# Patient Record
Sex: Female | Born: 1940
Health system: Southern US, Community
[De-identification: ages and names within clinical notes are randomized; demographics above are authoritative.]

## PROBLEM LIST (undated history)

## (undated) DIAGNOSIS — I10 Essential (primary) hypertension: Secondary | ICD-10-CM

## (undated) DIAGNOSIS — M797 Fibromyalgia: Secondary | ICD-10-CM

## (undated) DIAGNOSIS — E039 Hypothyroidism, unspecified: Secondary | ICD-10-CM

## (undated) DIAGNOSIS — E119 Type 2 diabetes mellitus without complications: Secondary | ICD-10-CM

## (undated) DIAGNOSIS — Z8719 Personal history of other diseases of the digestive system: Secondary | ICD-10-CM

## (undated) DIAGNOSIS — F0390 Unspecified dementia without behavioral disturbance: Secondary | ICD-10-CM

## (undated) DIAGNOSIS — R519 Headache, unspecified: Secondary | ICD-10-CM

## (undated) DIAGNOSIS — M1990 Unspecified osteoarthritis, unspecified site: Secondary | ICD-10-CM

## (undated) DIAGNOSIS — R51 Headache: Secondary | ICD-10-CM

## (undated) DIAGNOSIS — T884XXA Failed or difficult intubation, initial encounter: Secondary | ICD-10-CM

## (undated) DIAGNOSIS — T4145XA Adverse effect of unspecified anesthetic, initial encounter: Secondary | ICD-10-CM

## (undated) DIAGNOSIS — K219 Gastro-esophageal reflux disease without esophagitis: Secondary | ICD-10-CM

## (undated) DIAGNOSIS — T8859XA Other complications of anesthesia, initial encounter: Secondary | ICD-10-CM

## (undated) HISTORY — PX: TONSILLECTOMY: SUR1361

## (undated) HISTORY — PX: KNEE SURGERY: SHX244

## (undated) HISTORY — PX: CHOLECYSTECTOMY: SHX55

## (undated) HISTORY — PX: BREAST SURGERY: SHX581

## (undated) HISTORY — PX: DILATION AND CURETTAGE OF UTERUS: SHX78

---

## 1997-12-19 ENCOUNTER — Ambulatory Visit (HOSPITAL_COMMUNITY): Admission: RE | Admit: 1997-12-19 | Discharge: 1997-12-19 | Payer: Self-pay | Admitting: Gastroenterology

## 1998-03-25 ENCOUNTER — Other Ambulatory Visit: Admission: RE | Admit: 1998-03-25 | Discharge: 1998-03-25 | Payer: Self-pay | Admitting: Gynecology

## 1999-09-18 ENCOUNTER — Other Ambulatory Visit: Admission: RE | Admit: 1999-09-18 | Discharge: 1999-09-18 | Payer: Self-pay | Admitting: Gynecology

## 2000-11-16 ENCOUNTER — Other Ambulatory Visit: Admission: RE | Admit: 2000-11-16 | Discharge: 2000-11-16 | Payer: Self-pay | Admitting: Gynecology

## 2001-03-15 ENCOUNTER — Encounter: Admission: RE | Admit: 2001-03-15 | Discharge: 2001-06-13 | Payer: Self-pay | Admitting: Family Medicine

## 2004-10-30 ENCOUNTER — Other Ambulatory Visit: Admission: RE | Admit: 2004-10-30 | Discharge: 2004-10-30 | Payer: Self-pay | Admitting: Gynecology

## 2006-02-10 ENCOUNTER — Other Ambulatory Visit: Admission: RE | Admit: 2006-02-10 | Discharge: 2006-02-10 | Payer: Self-pay | Admitting: Gynecology

## 2006-04-24 ENCOUNTER — Emergency Department (HOSPITAL_COMMUNITY): Admission: EM | Admit: 2006-04-24 | Discharge: 2006-04-24 | Payer: Self-pay | Admitting: Emergency Medicine

## 2007-02-14 ENCOUNTER — Other Ambulatory Visit: Admission: RE | Admit: 2007-02-14 | Discharge: 2007-02-14 | Payer: Self-pay | Admitting: Gynecology

## 2007-03-16 ENCOUNTER — Encounter: Admission: RE | Admit: 2007-03-16 | Discharge: 2007-03-16 | Payer: Self-pay | Admitting: Surgery

## 2007-03-30 ENCOUNTER — Encounter: Admission: RE | Admit: 2007-03-30 | Discharge: 2007-03-30 | Payer: Self-pay | Admitting: Gastroenterology

## 2007-09-21 ENCOUNTER — Ambulatory Visit (HOSPITAL_COMMUNITY): Admission: RE | Admit: 2007-09-21 | Discharge: 2007-09-21 | Payer: Self-pay | Admitting: Surgery

## 2007-09-25 ENCOUNTER — Emergency Department (HOSPITAL_COMMUNITY): Admission: EM | Admit: 2007-09-25 | Discharge: 2007-09-25 | Payer: Self-pay | Admitting: Emergency Medicine

## 2008-03-02 ENCOUNTER — Encounter: Admission: RE | Admit: 2008-03-02 | Discharge: 2008-03-02 | Payer: Self-pay | Admitting: Family Medicine

## 2008-03-19 ENCOUNTER — Ambulatory Visit: Admission: RE | Admit: 2008-03-19 | Discharge: 2008-06-17 | Payer: Self-pay | Admitting: Family Medicine

## 2010-06-03 NOTE — Consult Note (Signed)
NAME:  Jennifer Boyer, Jennifer Boyer NO.:  [id number]   MEDICAL RECORD NO.:  [id number]          PATIENT TYPE:  EMS   LOCATION:  ED                           FACILITY:  [facility]   PHYSICIAN:  Almond Lint, MD       DATE OF BIRTH:  March 05, 1940   DATE OF CONSULTATION:  09/25/2007  DATE OF DISCHARGE:  09/25/2007                                 CONSULTATION   CHIEF COMPLAINT:  Right upper quadrant abdominal pain.   HISTORY OF PRESENT ILLNESS:  Jennifer Boyer is a 70 year old female who has  a history of right upper quadrant pain for the last year.  She reports  that she did have pain last year that was attributed to her gallbladder  and she underwent a laparoscopic cholecystectomy by Dr. Corliss Skains.  Postoperatively, she developed a different kind of pain 2 days later in  the right upper quadrant that she describes feeling like a branding  iron.  This has been going on for about a year.  She has tried taking  ibuprofen and it does not help.  She had a colonoscopy and upper  endoscopy, which were nondiagnostic.  She also underwent CT scanning,  which was not revealing either.  Since that time, has had continued  pain, she underwent an exploratory diagnostic laparoscopy to evaluate  what could possibly be the cause of her pain.  The adhesions in the  omentum to the liver were taken down and she woke up from surgery with  the same pain.  She was doing okay over the last couple days, but then  it became unbearable.  She called me on the phone and I asked her to  come in to be evaluated.  She states that, in the past she has tried  ibuprofen for this pain to no avail, as well as Maalox.  She has also  tried stool softeners.  Currently, she has not had a bowel movement  since surgery 5 days ago.  Denies fevers or chills.  She denies nausea  or vomiting.  Narcotics cause minimal relief.   PAST MEDICAL HISTORY:  Significant for a history of either DCIS or LCIS,  which she describes as precancerous  lesions in her breast requiring  bilateral mastectomies.   SURGICAL HISTORY:  Lap chole, bilateral mastectomies, diagnostic  laparoscopy.   ALLERGIES:  None.   REVIEW OF SYSTEMS:  Otherwise negative times 11 systems, except for the  HPI.   FAMILY HISTORY:  She has a brother who had a mastectomy for breast  cancer.   EXAMINATION:  VITAL SIGNS:  Temperature 98.2, pulse 72, blood pressure  137/78, respiratory rate 20, 98% on room air.  Pain score 10/10.  CONSTITUTIONAL:  She is alert and oriented x3 and is in tears.  HEENT:  Normocephalic, atraumatic.  Pupils equal, round, and reactive to  light.  ABDOMEN:  Soft, nondistended.  Her current laparoscopy incisions are  nontender.  The site of the most lateral lap chole incision, which was  not reopened, is exquisitely tender, and the tenderness extends from up  to the margin of the  floating ribs 11 and 12.  There is no erythema  here.  There is no mass palpated here.  EXTREMITIES:  Warm and well perfused with no pitting edema.  Pulses are  palpable.   LABS:  White count is normal with a white count of 6600 with neutrophil  percentage of 55.  H&H is 13.1 and 38.6, platelet count is 347,000.  Chemistries are within normal limits, including LFTs, which are within  normal limits.  A 3-way of the abdomen demonstrates stool in the colon,  but no evidence of acute abnormality.  There is some left basilar  scarring noted in the right lung.   ASSESSMENT:  Jennifer Boyer is a 70 year old female with chronic right upper  quadrant abdominal pain following lap chole.  Based on the location of  area of old incision, I theorize that perhaps she had some entrapped  nerve ending or musculoskeletal scar tissue between her fascia layers.  I attempted injecting this with local anesthesia, and it seemed to help  temporarily, but she kept having pain come back in the same areas that I  had anesthetized.  It is unclear if the pain was deeper or in the   abdominal wall, but she seemed to state that where I was injecting was  the spot that was hurting.  I elected not to perform a CT scan on her as  her white count is normal and her LFTs are normal.  She had a scan for  this pain before, which did not yield any relevant information.  Given  the fact that she has no current change in the pain and no indicators of  infection, I do not feel a CT scan would be helpful at the immediate  time.  We gave her a shot of Toradol IM and Dilaudid IM in the ER.  She  has been taking Vicodin and has noted minimal to no relief with this.  I  advised her to try Percocet, for which I wrote her a prescription, as  well as standing ibuprofen.  With the thought that this may be trapped  nerve ending I also wrote her for Neurontin 100 mg in the morning and  lunch, and 200 mg at night for 3 weeks to see if she has any relief.  I  also advised her that she does appear to be quite constipated and that I  would have her take Miralax once a day and Colace twice a day until she  becomes more regular.  She expressed understanding.  I will discuss the  patient's visit with Dr. Corliss Skains.  Overall, I think the primary goal for  her is to see a pain specialist to perhaps try a TENS unit or perhaps  some other alternative method of pain control.      Almond Lint, MD  Electronically Signed     FB/MEDQ  D:  09/25/2007  T:  09/26/2007  Job:  [id number]

## 2010-06-03 NOTE — Op Note (Signed)
NAME:  Jennifer Boyer, Jennifer Boyer NO.:  [id number]   MEDICAL RECORD NO.:  [id number]          PATIENT TYPE:  AMB   LOCATION:  SDS                          FACILITY:  [facility]   PHYSICIAN:  Wilmon Arms. Corliss Skains, M.D. DATE OF BIRTH:  11-Oct-1940   DATE OF PROCEDURE:  09/21/2007  DATE OF DISCHARGE:  09/21/2007                               OPERATIVE REPORT   PREOPERATIVE DIAGNOSIS:  Persistent right upper quadrant pain.   POSTOPERATIVE DIAGNOSIS:  Persistent right upper quadrant pain.   PROCEDURE PERFORMED:  Diagnostic laparoscopy with laparoscopic lysis of  adhesions.   SURGEON:  Wilmon Arms. Corliss Skains, MD, FACS   ANESTHESIA:  General endotracheal.   INDICATIONS:  The patient is a 70 year old female who is about a year  postop from a laparoscopic cholecystectomy for chronic calculus and  cholecystitis.  Initially, the patient did quite well but she has  developed some chronic right upper quadrant abdominal pain.  She has  undergone extensive workup.  A CT scan was essentially negative.  EGD  showed a small hiatal hernia and a Schatzki's ring.  A colonoscopy  showed some left-sided diverticulosis.  I have conferred with the  patient and her gastroenterologist, Dr. Matthias Hughs.  We  discussed the  possibility of a diagnostic laparoscopy to look at the right upper  quadrant.  The patient presents now for diagnostic laparoscopy.   DESCRIPTION OF PROCEDURE:  The patient was brought to the operating room  and placed in a supine position on the operating room table.  After an  adequate level of general anesthesia was obtained, her abdomen was  prepped with Betadine and draped in sterile fashion.  We anesthetized  the infraumbilical incision with 0.25% Marcaine with epinephrine.  I  made a transverse incision through this old scar.  Dissection was  carried down the fascia.  The fascia was grasped with Petro clamps and  opened vertically.  We entered the peritoneal cavity.  There were no  adhesions at the umbilicus.  There was a small laxity in the abdominal  wall directly behind the umbilicus.  However, there was no sign of  herniation.  We placed a pursestring suture of 0 Vicryl around the  fascial opening.  The Hasson cannula was inserted and secured to stay  suture.  Pneumoperitoneum was obtained by insufflating CO2 maintaining  maximum pressure of 15 mmHg.  The patient was positioned reverse  Trendelenburg and tilted to her left.  We examined the right upper  quadrant.  There were no adhesions to the anterior surface of the liver.  There were no muscular defects or any gross abnormalities of the  anterior abdominal wall.  The liver appeared normal in color.  I placed  a 5 mm port in the subxiphoid position.  Another 5-mm port was placed in  the right upper quadrant.  We used her old scars for all of these.  I  used a blunt dissector to elevate the edge of the liver.  The omentum  had become densely adherent into the gallbladder fossa.  We meticulously  dissected this away from the liver.  We  continued dissecting all the way  down the gallbladder fossa until we could visualize the clips.  We  cauterized thoroughly for any bleeding.  We thoroughly irrigated the  right upper quadrant.  I attempted to place a small sheet of Seprafilm.  However, when I tried to place the Seprafilm in the gallbladder fossa,  the film essentially disintegrated and balled up into a small area.  This was not adequate.  We then tried a piece of SurgiWrap.  We were  able to place this in the gallbladder fossa to prevent any further  adhesions in this area.  Hemostasis was good.  We took some adhesions  down the anterior wall of the cecum to the right lateral abdominal wall.  No bleeding was noted.  We evacuated the  pneumoperitoneum as we removed the ports.  The umbilical fascia was  closed with the pursestring suture.  A 4-0 Monocryl was used to close  the skin incisions.  Steri-Strips and clean  dressings were applied.  The  patient was then extubated and brought to recovery in stable condition.  All sponge, instrument, and needle counts were correct.      Wilmon Arms. Tsuei, M.D.  Electronically Signed     MKT/MEDQ  D:  09/21/2007  T:  09/22/2007  Job:  [id number]

## 2010-10-22 LAB — DIFFERENTIAL
Basophils Absolute: 0
Basophils Relative: 0
Eosinophils Absolute: 0.1
Eosinophils Relative: 1
Lymphocytes Relative: 38
Lymphs Abs: 2.5
Monocytes Absolute: 0.4
Monocytes Relative: 6
Neutro Abs: 3.5
Neutrophils Relative %: 55

## 2010-10-22 LAB — COMPREHENSIVE METABOLIC PANEL
Albumin: 3.4 — ABNORMAL LOW
BUN: 8
Calcium: 9.1
Glucose, Bld: 101 — ABNORMAL HIGH
Potassium: 4.2
Total Protein: 7.2

## 2010-10-22 LAB — CBC
HCT: 38.6
Hemoglobin: 13.1
MCHC: 33.8
MCV: 96.9
Platelets: 247
RBC: 3.98
RDW: 12.6
WBC: 6.5

## 2012-09-20 ENCOUNTER — Encounter (HOSPITAL_COMMUNITY): Payer: Self-pay | Admitting: Pharmacist

## 2012-09-23 MED ORDER — BUPIVACAINE HCL (PF) 0.25 % IJ SOLN
INTRAMUSCULAR | Status: AC
  Filled 2012-09-23: qty 30

## 2012-09-27 ENCOUNTER — Encounter (HOSPITAL_COMMUNITY)
Admission: RE | Admit: 2012-09-27 | Discharge: 2012-09-27 | Disposition: A | Discharge status: Home / Self Care | Payer: Medicare Other | Source: Ambulatory Visit | Attending: Obstetrics and Gynecology | Admitting: Obstetrics and Gynecology

## 2012-09-27 ENCOUNTER — Encounter (HOSPITAL_COMMUNITY): Payer: Self-pay

## 2012-09-27 ENCOUNTER — Other Ambulatory Visit: Payer: Self-pay

## 2012-09-27 HISTORY — DX: Other complications of anesthesia, initial encounter: T88.59XA

## 2012-09-27 HISTORY — DX: Type 2 diabetes mellitus without complications: E11.9

## 2012-09-27 HISTORY — DX: Hypothyroidism, unspecified: E03.9

## 2012-09-27 HISTORY — DX: Adverse effect of unspecified anesthetic, initial encounter: T41.45XA

## 2012-09-27 LAB — CBC
HCT: 39.7 % (ref 36.0–46.0)
Hemoglobin: 13.2 g/dL (ref 12.0–15.0)
MCH: 31.5 pg (ref 26.0–34.0)
MCHC: 33.2 g/dL (ref 30.0–36.0)
MCV: 94.7 fL (ref 78.0–100.0)
RBC: 4.19 MIL/uL (ref 3.87–5.11)

## 2012-09-27 LAB — COMPREHENSIVE METABOLIC PANEL
ALT: 18 U/L (ref 0–35)
BUN: 19 mg/dL (ref 6–23)
CO2: 26 mEq/L (ref 19–32)
Calcium: 9.9 mg/dL (ref 8.4–10.5)
Creatinine, Ser: 0.81 mg/dL (ref 0.50–1.10)
GFR calc Af Amer: 83 mL/min — ABNORMAL LOW (ref >90)
GFR calc non Af Amer: 71 mL/min — ABNORMAL LOW (ref >90)
Glucose, Bld: 112 mg/dL — ABNORMAL HIGH (ref 70–99)
Sodium: 136 mEq/L (ref 135–145)

## 2012-09-27 NOTE — Patient Instructions (Addendum)
20 SCOTTY PINDER  09/27/2012   Your procedure is scheduled on:  09/30/12  Enter through the Main Entrance of [facility] at 6 AM.  Pick up the phone at the desk and dial [phone].   Call this number if you have problems the morning of surgery: [phone]   Remember:   Do not eat food:After Midnight.  Do not drink clear liquids: After Midnight.  Take these medicines the morning of surgery with A SIP OF WATER: Hold Metformin for 24hrs prior to surgery   Do not wear jewelry, make-up or nail polish.  Do not wear lotions, powders, or perfumes. You may wear deodorant.  Do not shave 48 hours prior to surgery.  Do not bring valuables to the hospital.  [facility] is not   responsible for any belongings or valuables brought to the hospital.  Contacts, dentures or bridgework may not be worn into surgery.  Leave suitcase in the car. After surgery it may be brought to your room.  For patients admitted to the hospital, checkout time is 11:00 AM the day of              discharge.   Patients discharged the day of surgery will not be allowed to drive             home.  Name and phone number of your driver: husband Amada Jupiter  Special Instructions:   Shower using CHG 2 nights before surgery and the night before surgery.  If you shower the day of surgery use CHG.  Use special wash - you have one bottle of CHG for all showers.  You should use approximately 1/3 of the bottle for each shower.   Please read over the following fact sheets that you were given:   Surgical Site Infection Prevention   20 DESTINEE TABER  09/27/2012   Your procedure is scheduled on:  09/30/12  Enter through the Main Entrance of [facility] at 6 AM.  Pick up the phone at the desk and dial [phone].   Call this number if you have problems the morning of surgery: [phone]   Remember:   Do not eat food:After Midnight.  Do not drink clear liquids: After Midnight.  Take these medicines the morning of surgery with A SIP OF  WATER: hold Metformin for 24hrs prior to surgery   Do not wear jewelry, make-up or nail polish.  Do not wear lotions, powders, or perfumes. You may wear deodorant.  Do not shave 48 hours prior to surgery.  Do not bring valuables to the hospital.  [facility] is not   responsible for any belongings or valuables brought to the hospital.  Contacts, dentures or bridgework may not be worn into surgery.  Leave suitcase in the car. After surgery it may be brought to your room.  For patients admitted to the hospital, checkout time is 11:00 AM the day of              discharge.   Patients discharged the day of surgery will not be allowed to drive             home.  Name and phone number of your driver: husband   Amada Jupiter  Special Instructions:   Shower using CHG 2 nights before surgery and the night before surgery.  If you shower the day of surgery use CHG.  Use special wash - you have one bottle of CHG for all showers.  You should use approximately 1/3  of the bottle for each shower.   Please read over the following fact sheets that you were given:   Surgical Site Infection Prevention

## 2012-09-28 NOTE — Pre-Procedure Instructions (Signed)
Anes record from surgery of 09/21/07 requested from [facility] per request of Cristela Blue, MD.

## 2012-09-28 NOTE — Anesthesia Preprocedure Evaluation (Addendum)
Anesthesia Evaluation  Patient identified by MRN, date of birth, ID band Patient awake    Reviewed: Allergy & Precautions, H&P , NPO status , Patient's Chart, lab work & pertinent test results  History of Anesthesia Complications (+) DIFFICULT AIRWAY  Airway Mallampati: III TM Distance: >3 FB Neck ROM: full    Dental no notable dental hx. (+) Teeth Intact   Pulmonary neg pulmonary ROS,    Pulmonary exam normal       Cardiovascular negative cardio ROS      Neuro/Psych negative neurological ROS  negative psych ROS   GI/Hepatic negative GI ROS, Neg liver ROS,   Endo/Other  diabetes, Oral Hypoglycemic AgentsHypothyroidism Morbid obesity  Renal/GU negative Renal ROS     Musculoskeletal negative musculoskeletal ROS (+)   Abdominal Normal abdominal exam  (+)   Peds  Hematology negative hematology ROS (+)   Anesthesia Other Findings   Reproductive/Obstetrics negative OB ROS                          Anesthesia Physical Anesthesia Plan  ASA: III  Anesthesia Plan: General LMA and General   Post-op Pain Management:    Induction: Intravenous  Airway Management Planned: LMA  Additional Equipment:   Intra-op Plan:   Post-operative Plan:   Informed Consent: I have reviewed the patients History and Physical, chart, labs and discussed the procedure including the risks, benefits and alternatives for the proposed anesthesia with the patient or authorized representative who has indicated his/her understanding and acceptance.     Plan Discussed with: CRNA, Anesthesiologist and Surgeon  Anesthesia Plan Comments: (Patient with history of difficult intubation at surgical center.  Had a subsequent surgery at [facility], was intubated with glide scope without difficulty.  Paper copy of anesthesia record from that procedure is on the chart.  Jasmine December, MD)       Anesthesia Quick Evaluation

## 2012-09-29 NOTE — H&P (Signed)
Jennifer Boyer is an 72 y.o. female was begin evaluated for pelvic relaxation. U/S noted thick endometrial stripe. Subsequent SHG C/W 15 x 8 mm polypoid process in endometrial cavity. Had EM polyp in past. No vaginal bleeding. Pertinent Gynecological History: Menses: post-menopausal Bleeding: none Contraception: none DES exposure: denies Blood transfusions: none Sexually transmitted diseases: no past history Previous GYN Procedures: DNC  Last mammogram: normal Date: 2014 Last pap: normal Date: 2014 OB History: G2, P2   Menstrual History: Menarche age: unknown  No LMP recorded.    Past Medical History  Diagnosis Date  . Complication of anesthesia     pt has been told she is hard to intubate.  . Diabetes mellitus without complication   . Hypothyroidism     Past Surgical History  Procedure Laterality Date  . Breast surgery      bilateral mastectomy  . Cholecystectomy    . Knee surgery    . Tonsillectomy    . Dilation and curettage of uterus      uterine polyp    No family history on file.  Social History:  reports that she has never smoked. She does not have any smokeless tobacco history on file. She reports that she does not drink alcohol or use illicit drugs.  Allergies: No Known Allergies  No prescriptions prior to admission    Review of Systems  Constitutional: Negative for fever.    There were no vitals taken for this visit. Physical Exam  Cardiovascular: Normal rate and regular rhythm.   Respiratory: Effort normal and breath sounds normal.  GI: Soft.  Genitourinary:  Normal size uterus, second degree prolapse, NT Adnexa NT without masses Cystocele/ rectocele     No results found for this or any previous visit (from the past 24 hour(s)).  No results found.  Assessment/Plan: 72 yo with U/S suggestive of recurrent EM polyp D/W patient H/S, Trueclear resectoscope and D&C-risks reviewed including infection, uterine perforation, organ damage,  bleeding/transfusion-HIV/Hep, DVT/PE, pneumonia, recurrent polyps or PMB. All questions answered.  Thara Searing II,[place]ott E 09/29/2012, 12:44 PM

## 2012-09-30 ENCOUNTER — Ambulatory Visit (HOSPITAL_COMMUNITY)
Admission: RE | Admit: 2012-09-30 | Discharge: 2012-09-30 | Disposition: A | Discharge status: Home / Self Care | Payer: Medicare Other | Source: Ambulatory Visit | Attending: Obstetrics and Gynecology | Admitting: Obstetrics and Gynecology

## 2012-09-30 ENCOUNTER — Ambulatory Visit (HOSPITAL_COMMUNITY): Payer: Medicare Other | Admitting: Anesthesiology

## 2012-09-30 ENCOUNTER — Encounter (HOSPITAL_COMMUNITY)
Admission: RE | Disposition: A | Discharge status: Home / Self Care | Payer: Self-pay | Source: Ambulatory Visit | Attending: Obstetrics and Gynecology

## 2012-09-30 ENCOUNTER — Encounter (HOSPITAL_COMMUNITY): Payer: Self-pay | Admitting: Anesthesiology

## 2012-09-30 DIAGNOSIS — R9389 Abnormal findings on diagnostic imaging of other specified body structures: Secondary | ICD-10-CM | POA: Insufficient documentation

## 2012-09-30 DIAGNOSIS — N840 Polyp of corpus uteri: Secondary | ICD-10-CM | POA: Insufficient documentation

## 2012-09-30 HISTORY — PX: DILATATION & CURETTAGE/HYSTEROSCOPY WITH TRUECLEAR: SHX6353

## 2012-09-30 LAB — GLUCOSE, CAPILLARY
Glucose-Capillary: 167 mg/dL — ABNORMAL HIGH (ref 70–99)
Glucose-Capillary: 186 mg/dL — ABNORMAL HIGH (ref 70–99)

## 2012-09-30 SURGERY — DILATATION & CURETTAGE/HYSTEROSCOPY WITH TRUCLEAR
Anesthesia: General | Site: Uterus | Wound class: Clean Contaminated

## 2012-09-30 MED ORDER — SODIUM CHLORIDE 0.9 % IR SOLN
Status: DC | PRN
  Administered 2012-09-30: 3000 mL

## 2012-09-30 MED ORDER — KETOROLAC TROMETHAMINE 30 MG/ML IJ SOLN: 15.0000 mg | Freq: Once | INTRAMUSCULAR | Status: DC | PRN

## 2012-09-30 MED ORDER — LIDOCAINE HCL (CARDIAC) 20 MG/ML IV SOLN
INTRAVENOUS | Status: DC | PRN
  Administered 2012-09-30: 75 mg via INTRAVENOUS

## 2012-09-30 MED ORDER — ONDANSETRON HCL 4 MG/2ML IJ SOLN
INTRAMUSCULAR | Status: DC | PRN
  Administered 2012-09-30: 4 mg via INTRAVENOUS

## 2012-09-30 MED ORDER — ONDANSETRON HCL 4 MG/2ML IJ SOLN
INTRAMUSCULAR | Status: AC
  Filled 2012-09-30: qty 2

## 2012-09-30 MED ORDER — ONDANSETRON HCL 4 MG/2ML IJ SOLN: 4.0000 mg | Freq: Once | INTRAMUSCULAR | Status: DC | PRN

## 2012-09-30 MED ORDER — PROPOFOL 10 MG/ML IV EMUL
INTRAVENOUS | Status: AC
  Filled 2012-09-30: qty 20

## 2012-09-30 MED ORDER — CEFAZOLIN SODIUM-DEXTROSE 2-3 GM-% IV SOLR: 2.0000 g | INTRAVENOUS | Status: DC

## 2012-09-30 MED ORDER — FENTANYL CITRATE 0.05 MG/ML IJ SOLN
INTRAMUSCULAR | Status: DC | PRN
  Administered 2012-09-30 (×2): 50 ug via INTRAVENOUS
  Administered 2012-09-30: 25 ug via INTRAVENOUS

## 2012-09-30 MED ORDER — LIDOCAINE HCL (CARDIAC) 20 MG/ML IV SOLN
INTRAVENOUS | Status: AC
  Filled 2012-09-30: qty 5

## 2012-09-30 MED ORDER — PROPOFOL 10 MG/ML IV BOLUS
INTRAVENOUS | Status: DC | PRN
  Administered 2012-09-30: 160 mg via INTRAVENOUS

## 2012-09-30 MED ORDER — CEFAZOLIN SODIUM-DEXTROSE 2-3 GM-% IV SOLR
INTRAVENOUS | Status: AC
  Filled 2012-09-30: qty 50

## 2012-09-30 MED ORDER — LACTATED RINGERS IV SOLN
INTRAVENOUS | Status: DC
  Administered 2012-09-30: 1000 mL via INTRAVENOUS

## 2012-09-30 MED ORDER — FENTANYL CITRATE 0.05 MG/ML IJ SOLN: 25.0000 ug | INTRAMUSCULAR | Status: DC | PRN

## 2012-09-30 MED ORDER — MEPERIDINE HCL 25 MG/ML IJ SOLN: 6.2500 mg | INTRAMUSCULAR | Status: DC | PRN

## 2012-09-30 MED ORDER — LIDOCAINE HCL 1 % IJ SOLN
INTRAMUSCULAR | Status: DC | PRN
  Administered 2012-09-30: 20 mL

## 2012-09-30 MED ORDER — FENTANYL CITRATE 0.05 MG/ML IJ SOLN
INTRAMUSCULAR | Status: AC
  Filled 2012-09-30: qty 2

## 2012-09-30 SURGICAL SUPPLY — 19 items
BLADE INCISOR TRUC PLUS 2.9 (ABLATOR) ×1 IMPLANT
CANISTERS HI-FLOW 3000CC (CANNISTER) ×4 IMPLANT
CATH ROBINSON RED A/P 16FR (CATHETERS) ×2 IMPLANT
CLOTH BEACON ORANGE TIMEOUT ST (SAFETY) ×2 IMPLANT
CONTAINER PREFILL 10% NBF 60ML (FORM) ×4 IMPLANT
DRAPE HYSTEROSCOPY (DRAPE) ×2 IMPLANT
DRESSING TELFA 8X3 (GAUZE/BANDAGES/DRESSINGS) ×2 IMPLANT
ELECT REM PT RETURN 9FT ADLT (ELECTROSURGICAL) ×2
ELECTRODE REM PT RTRN 9FT ADLT (ELECTROSURGICAL) ×1 IMPLANT
GLOVE BIO SURGEON STRL SZ7.5 (GLOVE) ×4 IMPLANT
GOWN STRL REIN XL XLG (GOWN DISPOSABLE) ×4 IMPLANT
INCISOR TRUC PLUS BLADE 2.9 (ABLATOR) ×2
KIT HYSTEROSCOPY TRUCLEAR (ABLATOR) ×2 IMPLANT
MORCELLATOR RECIP TRUCLEAR 4.0 (ABLATOR) IMPLANT
NEEDLE SPNL 22GX3.5 QUINCKE BK (NEEDLE) ×2 IMPLANT
PACK VAGINAL MINOR WOMEN LF (CUSTOM PROCEDURE TRAY) ×2 IMPLANT
PAD OB MATERNITY 4.3X12.25 (PERSONAL CARE ITEMS) ×2 IMPLANT
SYR CONTROL 10ML LL (SYRINGE) ×2 IMPLANT
TOWEL OR 17X24 6PK STRL BLUE (TOWEL DISPOSABLE) ×4 IMPLANT

## 2012-09-30 NOTE — Transfer of Care (Signed)
Immediate Anesthesia Transfer of Care Note  Patient: Jennifer Boyer  Procedure(s) Performed: Procedure(s): DILATATION & CURETTAGE/HYSTEROSCOPY WITH TRUECLEAR (N/A)  Patient Location: PACU  Anesthesia Type:General  Level of Consciousness: awake, alert  and oriented  Airway & Oxygen Therapy: Patient Spontanous Breathing and Patient connected to nasal cannula oxygen  Post-op Assessment: Report given to PACU RN and Post -op Vital signs reviewed and stable  Post vital signs: stable  Complications: No apparent anesthesia complications

## 2012-09-30 NOTE — Op Note (Signed)
NAME:  Jennifer Boyer, Jennifer Boyer NO.:  [id number]  MEDICAL RECORD NO.:  [id number]  LOCATION:  [facility]                          FACILITY:  [facility]  PHYSICIAN:  Guy Sandifer. Henderson Cloud, M.D. DATE OF BIRTH:  December 09, 1940  DATE OF PROCEDURE:  09/30/2012 DATE OF DISCHARGE:                              OPERATIVE REPORT   PREOPERATIVE DIAGNOSIS:  Thickened endometrium in a postmenopausal patient.  POSTOPERATIVE DIAGNOSIS:  Endometrial polyp.  PROCEDURE:  Hysteroscopy with complete resection of endometrial polyp, dilation and curettage.  SURGEON:  Guy Sandifer. Henderson Cloud, M.D.  ANESTHESIA:  General with LMA.  ESTIMATED BLOOD LOSS:  Drops I and O's distending media 60 mL deficit.  SPECIMENS: 1. Endometrial curettings. 2. Endometrial resection both to pathology.  INDICATION AND CONSENT:  This patient is a 72 year old female with symptomatic rectocele.  Evaluation for this included ultrasound which showed a thickened endometrium.  Sonohysterogram is consistent with a polypoid type mass.  Hysteroscopy D and C, with true clear resectoscope has been discussed preoperatively.  Potential risks and complications were reviewed preoperatively including but not limited to, infection, uterine perforation, organ damage, bleeding requiring transfusion of blood products with HIV and hepatitis acquisition, DVT, PE, pneumonia, laparoscopy, laparotomy, abnormal bleeding, recurrent polyps.  All questions has been answered and consent was signed on the chart.  FINDINGS:  There is an approximately 1.5 x 1 cm polyp in the right upper endometrial cavity.  Both fallopian tube, ostia were identified.  DESCRIPTION OF PROCEDURE:  The patient was taken to the operating room. She was identified, placed in dorsal supine position.  General anesthesia was induced via LMA.  She was placed in a dorsal lithotomy position.  Time-out undertaken.  She was prepped.  Bladder straight catheterized and draped in a sterile fashion.   Bivalve speculum was placed in the vagina and anterior cervical lip was injected with 1% plain Xylocaine and grasped with single-tooth tenaculum.  Paracervical block was placed at 2, 4, 5, 7, 8, and 10 o'clock positions with approximately 20 mL of the same solution.  Cervix was gently progressively dilated to a 17 dilator.  The true clear hysteroscope was then placed endocervical canal advanced under direct visualization using distending media.  The above findings were noted.  The trocar device was then used to resect the polypoid mass without difficulty.  Device was removed and sharp curettage was carried out for scant tissue.  All instruments were removed.  All counts were correct. Examination reveals the uterus to have good support.  There is a first- degree cystocele.  The rectocele does protrude at the vaginal introitus. On rectal exam, there is a lower central defect in the rectovaginal septum.  The patient is awake and taken to recovery room in stable condition.     Guy Sandifer Henderson Cloud, M.D.     JET/MEDQ  D:  09/30/2012  T:  09/30/2012  Job:  [id number]

## 2012-09-30 NOTE — Anesthesia Postprocedure Evaluation (Signed)
  Anesthesia Post-op Note  Anesthesia Post Note  Patient: Jennifer Boyer  Procedure(s) Performed: Procedure(s) (LRB): DILATATION & CURETTAGE/HYSTEROSCOPY WITH TRUECLEAR (N/A)  Anesthesia type: General  Patient location: PACU  Post pain: Pain level controlled  Post assessment: Post-op Vital signs reviewed  Last Vitals:  Filed Vitals:   09/30/12 0901  BP:   Pulse: 63  Temp: 36.8 C  Resp: 18    Post vital signs: Reviewed  Level of consciousness: sedated  Complications: No apparent anesthesia complications

## 2012-09-30 NOTE — Progress Notes (Signed)
Reviewed with patient procedure No changes to H&P per patient history  

## 2012-09-30 NOTE — Brief Op Note (Signed)
09/30/2012  7:52 AM  PATIENT:  Jennifer Boyer  72 y.o. female  PRE-OPERATIVE DIAGNOSIS:  EM POLYP CPT [id number]  POST-OPERATIVE DIAGNOSIS:  endometrial polyp  PROCEDURE:  Procedure(s): DILATATION & CURETTAGE/HYSTEROSCOPY WITH TRUECLEAR (N/A)  SURGEON:  Surgeon(s) and Role:    * Leslie Andrea, MD - Primary  PHYSICIAN ASSISTANT:   ASSISTANTS: none   ANESTHESIA:   general  EBL:  Total I/O In: 1000 [I.V.:1000] Out: -   BLOOD ADMINISTERED:none  DRAINS: none   LOCAL MEDICATIONS USED:  LIDOCAINE   SPECIMEN:  Source of Specimen:  EM resecton, EM curettings  DISPOSITION OF SPECIMEN:  PATHOLOGY  COUNTS:  YES  TOURNIQUET:  * No tourniquets in log *  DICTATION: .Other Dictation: Dictation Number [id number]  PLAN OF CARE: Discharge to home after PACU  PATIENT DISPOSITION:  PACU - hemodynamically stable.   Delay start of Pharmacological VTE agent (>24hrs) due to surgical blood loss or risk of bleeding: not applicable

## 2012-10-03 ENCOUNTER — Encounter (HOSPITAL_COMMUNITY): Payer: Self-pay | Admitting: Obstetrics and Gynecology

## 2013-06-26 ENCOUNTER — Encounter: Payer: Self-pay | Admitting: Podiatry

## 2013-06-26 ENCOUNTER — Ambulatory Visit (INDEPENDENT_AMBULATORY_CARE_PROVIDER_SITE_OTHER): Payer: Medicare Other | Admitting: Podiatry

## 2013-06-26 ENCOUNTER — Ambulatory Visit (INDEPENDENT_AMBULATORY_CARE_PROVIDER_SITE_OTHER): Payer: Medicare Other

## 2013-06-26 VITALS — BP 170/81 | HR 82 | Resp 18

## 2013-06-26 DIAGNOSIS — R52 Pain, unspecified: Secondary | ICD-10-CM

## 2013-06-26 DIAGNOSIS — M722 Plantar fascial fibromatosis: Secondary | ICD-10-CM

## 2013-06-26 DIAGNOSIS — M7660 Achilles tendinitis, unspecified leg: Secondary | ICD-10-CM

## 2013-06-26 MED ORDER — MELOXICAM 15 MG PO TABS: 15.0000 mg | ORAL_TABLET | Freq: Every day | ORAL | Status: DC

## 2013-06-26 NOTE — Progress Notes (Signed)
° °  Subjective:    Patient ID: Jennifer Boyer, female    DOB: [birth year], 73 y.o.   MRN: [id number]  HPI on my right heel it is hurting on the back and has been for years and has sharp pains and hurts mainly in the left and is worse and hurts some in the morning and hurts later in the evening.  This patient presents with multiple year history of bilateral posterior and inferior heel pain left more painful than right. Currently she is wearing an existing custom semirigid full-length orthotic dispensed many years ago for these problems. The orthotics provided some relief of symptoms.    Review of Systems  HENT: Positive for sinus pressure.   Eyes:       Eyes are dry  Respiratory: Positive for cough.   Genitourinary: Positive for urgency.  Musculoskeletal: Positive for gait problem.       Joint pain  All other systems reviewed and are negative.      Objective:   Physical Exam Orientated x3 space white female  Vascular: DP and PT pulses 2/4 bilaterally  Neurological: Sensation to 10 g monofilament wire intact 5/5 right and 4/5 left Vibratory sensation intact bilaterally Ankle reflexes reactive bilaterally  Dermatological: Texture and turgor are adequate  Musculoskeletal: Posterior and inferior heel are tender to palpation, bilaterally Patient is able to heel off bilaterally There are no palpable defects in the tendo Achilles bilaterally  X-ray examination right foot  Intact bony structure without fracture and/or dislocation.  Posterior and inferior calcaneal spurs  Hammertoe deformity second  Cavus foot type  Radiographic impression:  No acute bony abnormality noted right foot   X-ray examination left foot  Intact bony structure without fracture or dislocation noted  Posterior and inferior calcaneal spurs  Hammertoe deformitie second  Cavus foot type  Radiographic impression:  No acute bony abnormality noted in the left foot        Assessment & Plan:    Assessment: Bilateral Achilles tendinitis Bilateral plantar fasciitis  Plan: Rx meloxicam 15 mg #30 by mouth 1 daily with one refill  Discussion shoeing and stretching  Reappoint times 2 months

## 2013-06-27 ENCOUNTER — Encounter: Payer: Self-pay | Admitting: Podiatry

## 2013-08-28 ENCOUNTER — Encounter: Payer: Self-pay | Admitting: Podiatry

## 2013-08-28 ENCOUNTER — Ambulatory Visit (INDEPENDENT_AMBULATORY_CARE_PROVIDER_SITE_OTHER): Payer: Medicare Other | Admitting: Podiatry

## 2013-08-28 VITALS — BP 182/82 | HR 79 | Resp 18

## 2013-08-28 DIAGNOSIS — M722 Plantar fascial fibromatosis: Secondary | ICD-10-CM

## 2013-08-28 NOTE — Patient Instructions (Signed)
Wear boot on the foot leg daily and remove at night when sleeping. Do bent knee stretches 3 times a day x2 minutes with shoes on

## 2013-08-29 DIAGNOSIS — M722 Plantar fascial fibromatosis: Secondary | ICD-10-CM

## 2013-08-29 MED ORDER — TRIAMCINOLONE ACETONIDE 10 MG/ML IJ SUSP
10.0000 mg | Freq: Once | INTRAMUSCULAR | Status: AC
  Administered 2013-08-29: 10 mg

## 2013-08-29 NOTE — Progress Notes (Signed)
Patient ID: Jennifer Boyer, female   DOB: 28-Apr-1940, 73 y.o.   MRN: [id number] Subjective: This patient presents for followup care from that initial visit of 06/26/2013   for bilateral Achilles tendinitis and fasciitis. t The left fasciitis is the most symptomatic area. She describes nausea from Mobic 15 mg after approximately 30 days. She temporarily DC'd the Mobic and the nausea resolved. She then restarted the Mobic and now denies nausea.  Objective: The left tendo Achilles is mildly tender to palpation at the insertional area The medial plantar left heel is exquisitely tender to palpation which is area of maximum discomfort   there is mild palpable tenderness medial plantar fascial area right in mid arch right There is no palpable tenderness  right tendo Achilles area  Assessment: Improved Achilles tendinitis right Mild plantar fasciitis right Moderate to severe plantar fasciitis left Achilles tendinitis left  Plan: Offered patient Kenalog injection for plantar fasciitis left. Patient verbally consents  Skin is prepped with alcohol and Betadine and 10 mg of Kenalog mixed with 10 mg a plain Xylocaine a 2.5 mg a plain Marcaine were injected inferiomedial left for Kenalog injection #1   Cam Walker boot was dispensed to wear them left foot when walking and standing Recommended continuous bent knee stretching bilaterally several times a day for 2 minutes Complete any remaining Mobic if tolerated  Reappoint at 6 weeks

## 2013-10-09 ENCOUNTER — Encounter: Payer: Self-pay | Admitting: Podiatry

## 2013-10-09 ENCOUNTER — Ambulatory Visit: Payer: Medicare Other | Admitting: Podiatry

## 2013-10-09 ENCOUNTER — Ambulatory Visit (INDEPENDENT_AMBULATORY_CARE_PROVIDER_SITE_OTHER): Payer: Medicare Other | Admitting: Podiatry

## 2013-10-09 VITALS — BP 141/64 | HR 83 | Resp 18

## 2013-10-09 DIAGNOSIS — M722 Plantar fascial fibromatosis: Secondary | ICD-10-CM

## 2013-10-09 NOTE — Patient Instructions (Signed)
Continue to wear the boot on the left leg Bent knee stretches 3 times a day x2 minutes Use a towel and call toes of torsion nails several times a day Okay to ride exercise bike Okay to swim Okay for deep water aerobics

## 2013-10-10 DIAGNOSIS — M722 Plantar fascial fibromatosis: Secondary | ICD-10-CM

## 2013-10-10 MED ORDER — TRIAMCINOLONE ACETONIDE 10 MG/ML IJ SUSP
10.0000 mg | Freq: Once | INTRAMUSCULAR | Status: AC
  Administered 2013-10-10: 10 mg

## 2013-10-10 NOTE — Progress Notes (Signed)
Patient ID: Jennifer Boyer, female   DOB: [birth year], 73 y.o.   MRN: [id number] Subjective: This patient presents for followup bilateral Achilles tendinitis and fasciitis left more symptomatic than right. The initial visit for these symptoms was 06/15/ 2015. She's had one Kenalog Injection inferior heel left with slight temporary improvement on the visit of 08/28/2013. She is wearing the Cam Walker boot dispensed on 08/28/2013 on the left foot and says that when she does not wear the left foot is more painful. She has some temporary nausea from meloxicam 15 mg and DC'd meloxicam for several weeks and restarted and nausea resolved. She has not had any meloxicam and 3 weeks and notices no difference.  Objective: Orientatedx30 73 year old white female  Vascular: DP and PT pulses 2/4 bilaterally There is no calf edema or calf tenderness bilaterally  Dermatological: Texture and turgor within normal limits without any skin lesions noted bilaterally  Musculoskeletal: Palpable tenderness mid plantar fascial area right without any palpable lesions No palpable tenderness at tendo Achilles right  Exquisite palpable tenderness medial plantar fascial area left Palpable tenderness at tendo Achilles left and insertional area left without any palpable lesions.  Plantar flexion 5/ 5 bilaterally  Assessment: Improving Achilles tendinitis right Improving plantar fasciitis right  Lack of improvement of Achilles tendinitis left Lack of improvement of plantar fasciitis left  History of GI intolerance of meloxicam  Plan: Offered patient an additional injection of Kenalog in the left and she verbally consents  The skin is prepped with alcohol and Betadine and 10 mg of Kenalog mixed with 10 mg of plain Xylocaine and 2.5 mg of plain Marcaine injected inferior heel left for Kenalog injection #2.  Maintain Cam Walker boot left Described bent knee stretching several times a day with shoes on Recommend  exercise bike or deep water aerobics for exercise  Reappoint x4 weeks

## 2013-11-06 ENCOUNTER — Ambulatory Visit (INDEPENDENT_AMBULATORY_CARE_PROVIDER_SITE_OTHER): Payer: Medicare Other | Admitting: Podiatry

## 2013-11-06 ENCOUNTER — Encounter: Payer: Self-pay | Admitting: Podiatry

## 2013-11-06 VITALS — BP 164/80 | HR 65 | Resp 13

## 2013-11-06 DIAGNOSIS — M7662 Achilles tendinitis, left leg: Secondary | ICD-10-CM

## 2013-11-06 MED ORDER — DICLOFENAC SODIUM 1 % TD GEL: 2.0000 g | Freq: Four times a day (QID) | TRANSDERMAL | Status: DC

## 2013-11-06 NOTE — Progress Notes (Signed)
   Subjective:    Patient ID: Jennifer Boyer, female    DOB: 09-14-1940, 73 y.o.   MRN: [id number]  HPI This patient presents for followup bilateral Achilles tendinitis and fasciitis left more symptomatic than the right. The initial visit for these symptoms was 07/03/2013. She has worn a Banker which was dispensed on 08/28/2013 and currently is not wearing boot. She is taking meloxicam 15 mg with a history of nausea. The left heels had 2 injections of Kenalog. At this time the right posterior inferior heel pain has proved considerably. The left posterior heel is her primary area of discomfort.   Review of Systems  All other systems reviewed and are negative.      Objective:   Physical Exam  Orientated x3  Vascular: DP and PT pulses 2/4 bilaterally  Musculoskeletal: Palpable tenderness in the posterior left heel with a palpable bony enlargement Mild palpable tenderness posterior right heel  Mild palpable tenderness plantar medial left heel without any palpable lesions Mild palpable tenderness medial plantar right heel  Plantar flexion 5/5 bilaterally without any evidence of hypertrophy of tendo Achilles bilaterally     Assessment & Plan:   Assessment: Improving Achilles tendinitis right with low-grade residual symptoms Improving plantar fasciitis right with low-grade residual symptoms  Improving plantar fasciitis left with low-grade residual symptoms Moderate symptoms Achilles tendinitis left  Plan: Maintain bad knee stretching Rx diclofenac gel apply 2 g 4 times a day to the posterior left tendo Achilles area Wear Cam Walker boot as needed if symptoms exacerbate  Reevaluate x6 weeks Consider EPOS therapy for posterior left Achilles tendinitis if pain in that area is not responding to patient's satisfaction

## 2013-11-06 NOTE — Patient Instructions (Signed)
Maintain bent knee stretching Apply diclofenac gel 2 g 4 times a day to the back of the left heel Wear a Cam Walker boot as needed

## 2013-12-18 ENCOUNTER — Ambulatory Visit (INDEPENDENT_AMBULATORY_CARE_PROVIDER_SITE_OTHER): Payer: Medicare Other | Admitting: Podiatry

## 2013-12-18 ENCOUNTER — Encounter: Payer: Self-pay | Admitting: Podiatry

## 2013-12-18 VITALS — BP 166/87 | HR 75 | Resp 14

## 2013-12-18 DIAGNOSIS — M7662 Achilles tendinitis, left leg: Secondary | ICD-10-CM

## 2013-12-18 NOTE — Progress Notes (Signed)
Patient ID: Jennifer Boyer, female   DOB: December 21, 1940, 73 y.o.   MRN: [id number]  Subjective: HPI This patient presents for followup bilateral Achilles tendinitis and fasciitis left more symptomatic than the right. The initial visit for these symptoms was 07/03/2013. She has worn a Banker which was dispensed on 08/28/2013 and currently is not wearing boot. She is taking meloxicam 15 mg with a history of nausea. The left heels had 2 injections of Kenalog. At this time the right posterior inferior heel pain has proved considerably. The left posterior heel is her primary area of discomfort  Start pain posterior left heel 8/10 now pain level 5/10. Patient is applying diclofenac gel 3 times a day to posterior left heel with slight improvement  Objective: Left foot Posterior heel prominent and tender to palpation Tendo Achilles tender to palpation without any palpable lesions Patient is able to heel off unilaterally on left  Right foot Mild palpable tenderness medial plantar right fascial insertional area without any palpable lesions Patient not able to heel off and right because of painful knee  Assessment: Achilles tendinitis left Plantar fasciitis right  Plan: At this time I am recommending EPAT therapy weekly for the left Achilles tendinitis. I discussed treatment with patient ranging from three-day treatments with approximately 50% success rate.  Continue to apply diclofenac gel 2 g 4 times a day to left tendo Achilles  Schedule treatments for EPAT 4

## 2014-08-23 ENCOUNTER — Ambulatory Visit
Admission: RE | Admit: 2014-08-23 | Discharge: 2014-08-23 | Disposition: A | Discharge status: Home / Self Care | Payer: Medicare Other | Source: Ambulatory Visit | Attending: Family Medicine | Admitting: Family Medicine

## 2014-08-23 ENCOUNTER — Other Ambulatory Visit: Payer: Self-pay | Admitting: Family Medicine

## 2014-08-23 DIAGNOSIS — M545 Low back pain: Secondary | ICD-10-CM

## 2014-08-23 DIAGNOSIS — M25512 Pain in left shoulder: Secondary | ICD-10-CM

## 2014-11-05 ENCOUNTER — Other Ambulatory Visit: Payer: Self-pay | Admitting: Gastroenterology

## 2014-11-05 DIAGNOSIS — R1310 Dysphagia, unspecified: Secondary | ICD-10-CM

## 2014-11-08 ENCOUNTER — Ambulatory Visit
Admission: RE | Admit: 2014-11-08 | Discharge: 2014-11-08 | Disposition: A | Discharge status: Home / Self Care | Payer: Medicare Other | Source: Ambulatory Visit | Attending: Gastroenterology | Admitting: Gastroenterology

## 2014-11-08 DIAGNOSIS — R1310 Dysphagia, unspecified: Secondary | ICD-10-CM

## 2016-04-20 DIAGNOSIS — E119 Type 2 diabetes mellitus without complications: Secondary | ICD-10-CM | POA: Diagnosis not present

## 2016-05-05 DIAGNOSIS — M1990 Unspecified osteoarthritis, unspecified site: Secondary | ICD-10-CM | POA: Diagnosis not present

## 2016-05-05 DIAGNOSIS — E7800 Pure hypercholesterolemia, unspecified: Secondary | ICD-10-CM | POA: Diagnosis not present

## 2016-05-05 DIAGNOSIS — E559 Vitamin D deficiency, unspecified: Secondary | ICD-10-CM | POA: Diagnosis not present

## 2016-05-05 DIAGNOSIS — E039 Hypothyroidism, unspecified: Secondary | ICD-10-CM | POA: Diagnosis not present

## 2016-05-05 DIAGNOSIS — E119 Type 2 diabetes mellitus without complications: Secondary | ICD-10-CM | POA: Diagnosis not present

## 2016-05-05 DIAGNOSIS — R030 Elevated blood-pressure reading, without diagnosis of hypertension: Secondary | ICD-10-CM | POA: Diagnosis not present

## 2016-05-05 DIAGNOSIS — M545 Low back pain: Secondary | ICD-10-CM | POA: Diagnosis not present

## 2016-05-05 DIAGNOSIS — Z7984 Long term (current) use of oral hypoglycemic drugs: Secondary | ICD-10-CM | POA: Diagnosis not present

## 2016-05-07 DIAGNOSIS — E119 Type 2 diabetes mellitus without complications: Secondary | ICD-10-CM | POA: Diagnosis not present

## 2016-05-07 DIAGNOSIS — E039 Hypothyroidism, unspecified: Secondary | ICD-10-CM | POA: Diagnosis not present

## 2016-05-07 DIAGNOSIS — E7800 Pure hypercholesterolemia, unspecified: Secondary | ICD-10-CM | POA: Diagnosis not present

## 2016-05-07 DIAGNOSIS — E559 Vitamin D deficiency, unspecified: Secondary | ICD-10-CM | POA: Diagnosis not present

## 2016-05-07 DIAGNOSIS — R69 Illness, unspecified: Secondary | ICD-10-CM | POA: Diagnosis not present

## 2016-05-07 DIAGNOSIS — Z7984 Long term (current) use of oral hypoglycemic drugs: Secondary | ICD-10-CM | POA: Diagnosis not present

## 2016-05-07 DIAGNOSIS — M1990 Unspecified osteoarthritis, unspecified site: Secondary | ICD-10-CM | POA: Diagnosis not present

## 2016-05-07 DIAGNOSIS — R030 Elevated blood-pressure reading, without diagnosis of hypertension: Secondary | ICD-10-CM | POA: Diagnosis not present

## 2016-06-12 DIAGNOSIS — E119 Type 2 diabetes mellitus without complications: Secondary | ICD-10-CM | POA: Diagnosis not present

## 2016-06-17 DIAGNOSIS — Z6836 Body mass index (BMI) 36.0-36.9, adult: Secondary | ICD-10-CM | POA: Diagnosis not present

## 2016-06-17 DIAGNOSIS — M150 Primary generalized (osteo)arthritis: Secondary | ICD-10-CM | POA: Diagnosis not present

## 2016-06-17 DIAGNOSIS — M5136 Other intervertebral disc degeneration, lumbar region: Secondary | ICD-10-CM | POA: Diagnosis not present

## 2016-06-17 DIAGNOSIS — E669 Obesity, unspecified: Secondary | ICD-10-CM | POA: Diagnosis not present

## 2016-06-17 DIAGNOSIS — E559 Vitamin D deficiency, unspecified: Secondary | ICD-10-CM | POA: Diagnosis not present

## 2016-06-17 DIAGNOSIS — M797 Fibromyalgia: Secondary | ICD-10-CM | POA: Diagnosis not present

## 2016-06-17 DIAGNOSIS — M3500 Sicca syndrome, unspecified: Secondary | ICD-10-CM | POA: Diagnosis not present

## 2016-06-17 DIAGNOSIS — M2550 Pain in unspecified joint: Secondary | ICD-10-CM | POA: Diagnosis not present

## 2016-06-18 DIAGNOSIS — E559 Vitamin D deficiency, unspecified: Secondary | ICD-10-CM | POA: Diagnosis not present

## 2016-06-18 DIAGNOSIS — E039 Hypothyroidism, unspecified: Secondary | ICD-10-CM | POA: Diagnosis not present

## 2016-06-18 DIAGNOSIS — H6121 Impacted cerumen, right ear: Secondary | ICD-10-CM | POA: Diagnosis not present

## 2016-06-18 DIAGNOSIS — Z7984 Long term (current) use of oral hypoglycemic drugs: Secondary | ICD-10-CM | POA: Diagnosis not present

## 2016-06-18 DIAGNOSIS — E119 Type 2 diabetes mellitus without complications: Secondary | ICD-10-CM | POA: Diagnosis not present

## 2016-06-18 DIAGNOSIS — R030 Elevated blood-pressure reading, without diagnosis of hypertension: Secondary | ICD-10-CM | POA: Diagnosis not present

## 2016-06-18 DIAGNOSIS — M1990 Unspecified osteoarthritis, unspecified site: Secondary | ICD-10-CM | POA: Diagnosis not present

## 2016-06-18 DIAGNOSIS — E7800 Pure hypercholesterolemia, unspecified: Secondary | ICD-10-CM | POA: Diagnosis not present

## 2016-06-29 DIAGNOSIS — M3500 Sicca syndrome, unspecified: Secondary | ICD-10-CM | POA: Diagnosis not present

## 2016-06-29 DIAGNOSIS — M5136 Other intervertebral disc degeneration, lumbar region: Secondary | ICD-10-CM | POA: Diagnosis not present

## 2016-06-29 DIAGNOSIS — M797 Fibromyalgia: Secondary | ICD-10-CM | POA: Diagnosis not present

## 2016-06-29 DIAGNOSIS — E669 Obesity, unspecified: Secondary | ICD-10-CM | POA: Diagnosis not present

## 2016-06-29 DIAGNOSIS — E559 Vitamin D deficiency, unspecified: Secondary | ICD-10-CM | POA: Diagnosis not present

## 2016-06-29 DIAGNOSIS — Z6836 Body mass index (BMI) 36.0-36.9, adult: Secondary | ICD-10-CM | POA: Diagnosis not present

## 2016-06-29 DIAGNOSIS — M150 Primary generalized (osteo)arthritis: Secondary | ICD-10-CM | POA: Diagnosis not present

## 2016-07-28 DIAGNOSIS — K219 Gastro-esophageal reflux disease without esophagitis: Secondary | ICD-10-CM | POA: Diagnosis not present

## 2016-09-08 DIAGNOSIS — E559 Vitamin D deficiency, unspecified: Secondary | ICD-10-CM | POA: Diagnosis not present

## 2016-09-08 DIAGNOSIS — E039 Hypothyroidism, unspecified: Secondary | ICD-10-CM | POA: Diagnosis not present

## 2016-09-08 DIAGNOSIS — R030 Elevated blood-pressure reading, without diagnosis of hypertension: Secondary | ICD-10-CM | POA: Diagnosis not present

## 2016-09-08 DIAGNOSIS — E119 Type 2 diabetes mellitus without complications: Secondary | ICD-10-CM | POA: Diagnosis not present

## 2016-09-08 DIAGNOSIS — M5416 Radiculopathy, lumbar region: Secondary | ICD-10-CM | POA: Diagnosis not present

## 2016-09-08 DIAGNOSIS — M1990 Unspecified osteoarthritis, unspecified site: Secondary | ICD-10-CM | POA: Diagnosis not present

## 2016-09-08 DIAGNOSIS — Z7984 Long term (current) use of oral hypoglycemic drugs: Secondary | ICD-10-CM | POA: Diagnosis not present

## 2016-09-08 DIAGNOSIS — E7800 Pure hypercholesterolemia, unspecified: Secondary | ICD-10-CM | POA: Diagnosis not present

## 2016-09-15 ENCOUNTER — Other Ambulatory Visit: Payer: Self-pay | Admitting: Family Medicine

## 2016-09-15 DIAGNOSIS — M5416 Radiculopathy, lumbar region: Secondary | ICD-10-CM

## 2016-09-17 ENCOUNTER — Ambulatory Visit
Admission: RE | Admit: 2016-09-17 | Discharge: 2016-09-17 | Disposition: A | Discharge status: Home / Self Care | Payer: Medicare HMO | Source: Ambulatory Visit | Attending: Family Medicine | Admitting: Family Medicine

## 2016-09-17 ENCOUNTER — Other Ambulatory Visit: Payer: Self-pay | Admitting: Family Medicine

## 2016-09-17 DIAGNOSIS — M5416 Radiculopathy, lumbar region: Secondary | ICD-10-CM

## 2016-09-17 DIAGNOSIS — M48061 Spinal stenosis, lumbar region without neurogenic claudication: Secondary | ICD-10-CM | POA: Diagnosis not present

## 2016-09-29 DIAGNOSIS — M5440 Lumbago with sciatica, unspecified side: Secondary | ICD-10-CM | POA: Diagnosis not present

## 2016-10-01 DIAGNOSIS — N819 Female genital prolapse, unspecified: Secondary | ICD-10-CM | POA: Diagnosis not present

## 2016-10-01 DIAGNOSIS — N958 Other specified menopausal and perimenopausal disorders: Secondary | ICD-10-CM | POA: Diagnosis not present

## 2016-10-01 DIAGNOSIS — Z124 Encounter for screening for malignant neoplasm of cervix: Secondary | ICD-10-CM | POA: Diagnosis not present

## 2016-10-01 DIAGNOSIS — Z6837 Body mass index (BMI) 37.0-37.9, adult: Secondary | ICD-10-CM | POA: Diagnosis not present

## 2016-10-02 ENCOUNTER — Other Ambulatory Visit: Payer: Self-pay | Admitting: Neurosurgery

## 2016-10-02 DIAGNOSIS — M5440 Lumbago with sciatica, unspecified side: Secondary | ICD-10-CM

## 2016-10-07 ENCOUNTER — Other Ambulatory Visit: Payer: Self-pay | Admitting: Neurosurgery

## 2016-10-07 DIAGNOSIS — M5440 Lumbago with sciatica, unspecified side: Secondary | ICD-10-CM

## 2016-10-08 ENCOUNTER — Other Ambulatory Visit: Payer: Self-pay | Admitting: Neurosurgery

## 2016-10-08 ENCOUNTER — Ambulatory Visit
Admission: RE | Admit: 2016-10-08 | Discharge: 2016-10-08 | Disposition: A | Discharge status: Home / Self Care | Payer: Medicare HMO | Source: Ambulatory Visit | Attending: Neurosurgery | Admitting: Neurosurgery

## 2016-10-08 DIAGNOSIS — M5440 Lumbago with sciatica, unspecified side: Secondary | ICD-10-CM

## 2016-10-08 DIAGNOSIS — M545 Low back pain: Secondary | ICD-10-CM | POA: Diagnosis not present

## 2016-10-08 MED ORDER — IOPAMIDOL (ISOVUE-M 200) INJECTION 41%
1.0000 mL | Freq: Once | INTRAMUSCULAR | Status: AC
  Administered 2016-10-08: 1 mL via INTRA_ARTICULAR

## 2016-10-08 MED ORDER — METHYLPREDNISOLONE ACETATE 40 MG/ML INJ SUSP (RADIOLOG
120.0000 mg | Freq: Once | INTRAMUSCULAR | Status: AC
  Administered 2016-10-08: 120 mg via INTRA_ARTICULAR

## 2016-10-08 NOTE — Discharge Instructions (Signed)

## 2016-10-12 ENCOUNTER — Other Ambulatory Visit: Payer: Medicare HMO

## 2016-10-22 DIAGNOSIS — R030 Elevated blood-pressure reading, without diagnosis of hypertension: Secondary | ICD-10-CM | POA: Diagnosis not present

## 2016-10-22 DIAGNOSIS — M5416 Radiculopathy, lumbar region: Secondary | ICD-10-CM | POA: Diagnosis not present

## 2016-10-22 DIAGNOSIS — M5116 Intervertebral disc disorders with radiculopathy, lumbar region: Secondary | ICD-10-CM | POA: Diagnosis not present

## 2016-10-22 DIAGNOSIS — E119 Type 2 diabetes mellitus without complications: Secondary | ICD-10-CM | POA: Diagnosis not present

## 2016-10-22 DIAGNOSIS — E039 Hypothyroidism, unspecified: Secondary | ICD-10-CM | POA: Diagnosis not present

## 2016-10-22 DIAGNOSIS — E7800 Pure hypercholesterolemia, unspecified: Secondary | ICD-10-CM | POA: Diagnosis not present

## 2016-10-22 DIAGNOSIS — Z7984 Long term (current) use of oral hypoglycemic drugs: Secondary | ICD-10-CM | POA: Diagnosis not present

## 2016-10-22 DIAGNOSIS — M1990 Unspecified osteoarthritis, unspecified site: Secondary | ICD-10-CM | POA: Diagnosis not present

## 2016-10-22 DIAGNOSIS — E559 Vitamin D deficiency, unspecified: Secondary | ICD-10-CM | POA: Diagnosis not present

## 2016-11-03 ENCOUNTER — Other Ambulatory Visit: Payer: Self-pay | Admitting: Neurosurgery

## 2016-11-03 DIAGNOSIS — I10 Essential (primary) hypertension: Secondary | ICD-10-CM | POA: Diagnosis not present

## 2016-11-03 DIAGNOSIS — Z1231 Encounter for screening mammogram for malignant neoplasm of breast: Secondary | ICD-10-CM | POA: Diagnosis not present

## 2016-11-03 DIAGNOSIS — M5440 Lumbago with sciatica, unspecified side: Secondary | ICD-10-CM | POA: Diagnosis not present

## 2016-11-03 DIAGNOSIS — Z6836 Body mass index (BMI) 36.0-36.9, adult: Secondary | ICD-10-CM | POA: Diagnosis not present

## 2016-12-08 DIAGNOSIS — E119 Type 2 diabetes mellitus without complications: Secondary | ICD-10-CM | POA: Diagnosis not present

## 2016-12-17 DIAGNOSIS — R69 Illness, unspecified: Secondary | ICD-10-CM | POA: Diagnosis not present

## 2017-01-22 DIAGNOSIS — E7800 Pure hypercholesterolemia, unspecified: Secondary | ICD-10-CM | POA: Diagnosis not present

## 2017-01-22 DIAGNOSIS — Z7984 Long term (current) use of oral hypoglycemic drugs: Secondary | ICD-10-CM | POA: Diagnosis not present

## 2017-01-22 DIAGNOSIS — M5416 Radiculopathy, lumbar region: Secondary | ICD-10-CM | POA: Diagnosis not present

## 2017-01-22 DIAGNOSIS — E119 Type 2 diabetes mellitus without complications: Secondary | ICD-10-CM | POA: Diagnosis not present

## 2017-01-22 DIAGNOSIS — E559 Vitamin D deficiency, unspecified: Secondary | ICD-10-CM | POA: Diagnosis not present

## 2017-01-22 DIAGNOSIS — E1165 Type 2 diabetes mellitus with hyperglycemia: Secondary | ICD-10-CM | POA: Diagnosis not present

## 2017-01-22 DIAGNOSIS — E039 Hypothyroidism, unspecified: Secondary | ICD-10-CM | POA: Diagnosis not present

## 2017-01-22 DIAGNOSIS — M1990 Unspecified osteoarthritis, unspecified site: Secondary | ICD-10-CM | POA: Diagnosis not present

## 2017-01-22 DIAGNOSIS — M5116 Intervertebral disc disorders with radiculopathy, lumbar region: Secondary | ICD-10-CM | POA: Diagnosis not present

## 2017-01-22 DIAGNOSIS — R030 Elevated blood-pressure reading, without diagnosis of hypertension: Secondary | ICD-10-CM | POA: Diagnosis not present

## 2017-01-26 DIAGNOSIS — M1990 Unspecified osteoarthritis, unspecified site: Secondary | ICD-10-CM | POA: Diagnosis not present

## 2017-01-26 DIAGNOSIS — E559 Vitamin D deficiency, unspecified: Secondary | ICD-10-CM | POA: Diagnosis not present

## 2017-01-26 DIAGNOSIS — Z7984 Long term (current) use of oral hypoglycemic drugs: Secondary | ICD-10-CM | POA: Diagnosis not present

## 2017-01-26 DIAGNOSIS — E7800 Pure hypercholesterolemia, unspecified: Secondary | ICD-10-CM | POA: Diagnosis not present

## 2017-01-26 DIAGNOSIS — M5416 Radiculopathy, lumbar region: Secondary | ICD-10-CM | POA: Diagnosis not present

## 2017-01-26 DIAGNOSIS — E039 Hypothyroidism, unspecified: Secondary | ICD-10-CM | POA: Diagnosis not present

## 2017-01-26 DIAGNOSIS — M5116 Intervertebral disc disorders with radiculopathy, lumbar region: Secondary | ICD-10-CM | POA: Diagnosis not present

## 2017-01-26 DIAGNOSIS — E119 Type 2 diabetes mellitus without complications: Secondary | ICD-10-CM | POA: Diagnosis not present

## 2017-01-26 DIAGNOSIS — R030 Elevated blood-pressure reading, without diagnosis of hypertension: Secondary | ICD-10-CM | POA: Diagnosis not present

## 2017-01-26 DIAGNOSIS — N3281 Overactive bladder: Secondary | ICD-10-CM | POA: Diagnosis not present

## 2017-02-16 ENCOUNTER — Ambulatory Visit
Admission: RE | Admit: 2017-02-16 | Discharge: 2017-02-16 | Disposition: A | Discharge status: Home / Self Care | Payer: Medicare HMO | Source: Ambulatory Visit | Attending: Neurosurgery | Admitting: Neurosurgery

## 2017-02-16 ENCOUNTER — Other Ambulatory Visit: Payer: Self-pay | Admitting: Neurosurgery

## 2017-02-16 DIAGNOSIS — M5440 Lumbago with sciatica, unspecified side: Secondary | ICD-10-CM

## 2017-02-16 DIAGNOSIS — M545 Low back pain: Secondary | ICD-10-CM | POA: Diagnosis not present

## 2017-02-16 MED ORDER — METHYLPREDNISOLONE ACETATE 40 MG/ML INJ SUSP (RADIOLOG
120.0000 mg | Freq: Once | INTRAMUSCULAR | Status: AC
  Administered 2017-02-16: 120 mg via INTRA_ARTICULAR

## 2017-02-16 NOTE — Discharge Instructions (Signed)

## 2017-03-05 DIAGNOSIS — M5440 Lumbago with sciatica, unspecified side: Secondary | ICD-10-CM | POA: Diagnosis not present

## 2017-03-18 ENCOUNTER — Other Ambulatory Visit: Payer: Self-pay | Admitting: Neurosurgery

## 2017-03-18 DIAGNOSIS — M5416 Radiculopathy, lumbar region: Secondary | ICD-10-CM | POA: Diagnosis not present

## 2017-03-18 DIAGNOSIS — Z6836 Body mass index (BMI) 36.0-36.9, adult: Secondary | ICD-10-CM | POA: Diagnosis not present

## 2017-03-18 DIAGNOSIS — M5440 Lumbago with sciatica, unspecified side: Secondary | ICD-10-CM | POA: Diagnosis not present

## 2017-03-18 DIAGNOSIS — I10 Essential (primary) hypertension: Secondary | ICD-10-CM | POA: Diagnosis not present

## 2017-03-19 NOTE — Pre-Procedure Instructions (Signed)
Jennifer Boyer  03/19/2017      [facility] # [place], [address] [place] [address] Phone: [phone] Fax: [phone]    Your procedure is scheduled on March 24, 2017.  Report to [facility] Admitting at 930 AM.  Call this number if you have problems the morning of surgery:  [phone]   Remember:  Do not eat food or drink liquids after midnight.  Take these medicines the morning of surgery with A SIP OF WATER amlodipine (norvasc), fluticaseon (flonase) nasal spray, levothyoxine (synthroid), pantoprazole (protonix), tolterodine (detrol).  7 days prior to surgery STOP taking any mobic (meloxicam), Aspirin (unless otherwise instructed by your surgeon), Aleve, Naproxen, Ibuprofen, Motrin, Advil, Goody's, BC's, all herbal medications, fish oil, and all vitamins  Continue all other medications as instructed by your physician except follow the above medication instructions before surgery   WHAT DO I DO ABOUT MY DIABETES MEDICATION?  Marland Kitchen Do not take oral diabetes medicines (pills) the morning of surgery sitagliptin Celesta Gentile) or metformin (glucophage).  Reviewed and Endorsed by [facility] Patient Education Committee, August 2015  How to Manage Your Diabetes Before and After Surgery  Why is it important to control my blood sugar before and after surgery? . Improving blood sugar levels before and after surgery helps healing and can limit problems. . A way of improving blood sugar control is eating a healthy diet by: o  Eating less sugar and carbohydrates o  Increasing activity/exercise o  Talking with your doctor about reaching your blood sugar goals . High blood sugars (greater than 180 mg/dL) can raise your risk of infections and slow your recovery, so you will need to focus on controlling your diabetes during the weeks before surgery. . Make sure that the doctor who takes care of your diabetes knows about your  planned surgery including the date and location.  How do I manage my blood sugar before surgery? . Check your blood sugar at least 4 times a day, starting 2 days before surgery, to make sure that the level is not too high or low. o Check your blood sugar the morning of your surgery when you wake up and every 2 hours until you get to the Short Stay unit. . If your blood sugar is less than 70 mg/dL, you will need to treat for low blood sugar: o Do not take insulin. o Treat a low blood sugar (less than 70 mg/dL) with  cup of clear juice (cranberry or apple), 4 glucose tablets, OR glucose gel. Recheck blood sugar in 15 minutes after treatment (to make sure it is greater than 70 mg/dL). If your blood sugar is not greater than 70 mg/dL on recheck, call [phone] o  for further instructions. . Report your blood sugar to the short stay nurse when you get to Short Stay.  . If you are admitted to the hospital after surgery: o Your blood sugar will be checked by the staff and you will probably be given insulin after surgery (instead of oral diabetes medicines) to make sure you have good blood sugar levels. o The goal for blood sugar control after surgery is 80-180 mg/dL.   Do not wear jewelry, make-up or nail polish.  Do not wear lotions, powders, or perfumes, or deodorant.  Do not shave 48 hours prior to surgery.  Men may shave face and neck.  Do not bring valuables to the hospital.  [facility]  is not responsible for any belongings or valuables.  Contacts, dentures or bridgework may not be worn into surgery.  Leave your suitcase in the car.  After surgery it may be brought to your room.  For patients admitted to the hospital, discharge time will be determined by your treatment team.  Patients discharged the day of surgery will not be allowed to drive home.    Special instructions:  [place]- Preparing For Surgery  Before surgery, you can play an important role. Because skin is not  sterile, your skin needs to be as free of germs as possible. You can reduce the number of germs on your skin by washing with CHG (chlorahexidine gluconate) Soap before surgery.  CHG is an antiseptic cleaner which kills germs and bonds with the skin to continue killing germs even after washing.  Please do not use if you have an allergy to CHG or antibacterial soaps. If your skin becomes reddened/irritated stop using the CHG.  Do not shave (including legs and underarms) for at least 48 hours prior to first CHG shower. It is OK to shave your face.  Please follow these instructions carefully.   1. Shower the NIGHT BEFORE SURGERY and the MORNING OF SURGERY with CHG.   2. If you chose to wash your hair, wash your hair first as usual with your normal shampoo.  3. After you shampoo, rinse your hair and body thoroughly to remove the shampoo.  4. Use CHG as you would any other liquid soap. You can apply CHG directly to the skin and wash gently with a scrungie or a clean washcloth.   5. Apply the CHG Soap to your body ONLY FROM THE NECK DOWN.  Do not use on open wounds or open sores. Avoid contact with your eyes, ears, mouth and genitals (private parts). Wash Face and genitals (private parts)  with your normal soap.  6. Wash thoroughly, paying special attention to the area where your surgery will be performed.  7. Thoroughly rinse your body with warm water from the neck down.  8. DO NOT shower/wash with your normal soap after using and rinsing off the CHG Soap.  9. Pat yourself dry with a CLEAN TOWEL.  10. Wear CLEAN PAJAMAS to bed the night before surgery, wear comfortable clothes the morning of surgery  11. Place CLEAN SHEETS on your bed the night of your first shower and DO NOT SLEEP WITH PETS.  Day of Surgery: Do not apply any deodorants/lotions. Please wear clean clothes to the hospital/surgery center.    Please read over the following fact sheets that you were given. Pain Booklet,  Coughing and Deep Breathing and Surgical Site Infection Prevention

## 2017-03-22 ENCOUNTER — Encounter (HOSPITAL_COMMUNITY)
Admission: RE | Admit: 2017-03-22 | Discharge: 2017-03-22 | Disposition: A | Discharge status: Home / Self Care | Payer: Medicare HMO | Source: Ambulatory Visit | Attending: Neurosurgery | Admitting: Neurosurgery

## 2017-03-22 ENCOUNTER — Other Ambulatory Visit: Payer: Self-pay

## 2017-03-22 ENCOUNTER — Encounter (HOSPITAL_COMMUNITY): Payer: Self-pay

## 2017-03-22 DIAGNOSIS — Z7984 Long term (current) use of oral hypoglycemic drugs: Secondary | ICD-10-CM | POA: Diagnosis not present

## 2017-03-22 DIAGNOSIS — I10 Essential (primary) hypertension: Secondary | ICD-10-CM | POA: Diagnosis not present

## 2017-03-22 DIAGNOSIS — M797 Fibromyalgia: Secondary | ICD-10-CM | POA: Diagnosis not present

## 2017-03-22 DIAGNOSIS — K219 Gastro-esophageal reflux disease without esophagitis: Secondary | ICD-10-CM | POA: Diagnosis not present

## 2017-03-22 DIAGNOSIS — Z79890 Hormone replacement therapy (postmenopausal): Secondary | ICD-10-CM | POA: Diagnosis not present

## 2017-03-22 DIAGNOSIS — Z79899 Other long term (current) drug therapy: Secondary | ICD-10-CM | POA: Diagnosis not present

## 2017-03-22 DIAGNOSIS — Z7951 Long term (current) use of inhaled steroids: Secondary | ICD-10-CM | POA: Diagnosis not present

## 2017-03-22 DIAGNOSIS — M48061 Spinal stenosis, lumbar region without neurogenic claudication: Secondary | ICD-10-CM | POA: Diagnosis not present

## 2017-03-22 DIAGNOSIS — M5116 Intervertebral disc disorders with radiculopathy, lumbar region: Secondary | ICD-10-CM | POA: Diagnosis not present

## 2017-03-22 DIAGNOSIS — E119 Type 2 diabetes mellitus without complications: Secondary | ICD-10-CM | POA: Diagnosis not present

## 2017-03-22 DIAGNOSIS — E039 Hypothyroidism, unspecified: Secondary | ICD-10-CM | POA: Diagnosis not present

## 2017-03-22 HISTORY — DX: Headache: R51

## 2017-03-22 HISTORY — DX: Failed or difficult intubation, initial encounter: T88.4XXA

## 2017-03-22 HISTORY — DX: Essential (primary) hypertension: I10

## 2017-03-22 HISTORY — DX: Personal history of other diseases of the digestive system: Z87.19

## 2017-03-22 HISTORY — DX: Headache, unspecified: R51.9

## 2017-03-22 HISTORY — DX: Unspecified osteoarthritis, unspecified site: M19.90

## 2017-03-22 HISTORY — DX: Fibromyalgia: M79.7

## 2017-03-22 HISTORY — DX: Gastro-esophageal reflux disease without esophagitis: K21.9

## 2017-03-22 LAB — CBC
HEMATOCRIT: 38.9 % (ref 36.0–46.0)
Hemoglobin: 12.7 g/dL (ref 12.0–15.0)
MCH: 32.6 pg (ref 26.0–34.0)
MCHC: 32.6 g/dL (ref 30.0–36.0)
MCV: 100 fL (ref 78.0–100.0)
Platelets: 232 10*3/uL (ref 150–400)
RBC: 3.89 MIL/uL (ref 3.87–5.11)
RDW: 12.9 % (ref 11.5–15.5)
WBC: 5.3 10*3/uL (ref 4.0–10.5)

## 2017-03-22 LAB — GLUCOSE, CAPILLARY: Glucose-Capillary: 202 mg/dL — ABNORMAL HIGH (ref 65–99)

## 2017-03-22 LAB — BASIC METABOLIC PANEL
ANION GAP: 11 (ref 5–15)
BUN: 13 mg/dL (ref 6–20)
CALCIUM: 9.4 mg/dL (ref 8.9–10.3)
CO2: 23 mmol/L (ref 22–32)
Chloride: 103 mmol/L (ref 101–111)
Creatinine, Ser: 0.79 mg/dL (ref 0.44–1.00)
GFR calc non Af Amer: >60 mL/min (ref >60)
GLUCOSE: 196 mg/dL — AB (ref 65–99)
POTASSIUM: 4.8 mmol/L (ref 3.5–5.1)
Sodium: 137 mmol/L (ref 135–145)

## 2017-03-22 LAB — SURGICAL PCR SCREEN
MRSA, PCR: NEGATIVE
STAPHYLOCOCCUS AUREUS: POSITIVE — AB

## 2017-03-22 LAB — HEMOGLOBIN A1C
Hgb A1c MFr Bld: 7.9 % — ABNORMAL HIGH (ref 4.8–5.6)
MEAN PLASMA GLUCOSE: 180.03 mg/dL

## 2017-03-22 NOTE — Progress Notes (Signed)
Anesthesia Chart Review:  Pt is a 77 year old female scheduled for left L4-5, L5-S1 laminectomy/ foraminotomy on 03/24/2017 with Kary Kos, MD  - PCP is Yaakov Guthrie, MD  PMH includes:  HTN, DM, hypothyroidism, GERD. Never smoker. BMI 36.5. S/p D&C 09/30/12  Anesthesia history: Pt reports history of difficult intubation many years ago. Has had surgery since without issue. LMA used for D&C in 2014.  Pt had a laparoscopy in 2009; Glidescope used to place 7.5 ETT on 1 attempt; records on paper chart for review.  On exam, pt can extend her neck easily and her mouth opens well; view is limited, Mallampati grade III.   Medications include: amlodipine, benazepril, levothyroxine, metformin, protonix, simvastatin, sitagliptin  BP (!) 165/77   Pulse 92   Temp 36.8 C   Resp 20   Ht 5\' 6"  (1.676 m)   Wt 226 lb 12.8 oz (102.9 kg)   SpO2 99%   BMI 36.61 kg/m   Preoperative labs reviewed.   - HbA1c 7.9, glucose 196  EKG 03/22/17: NSR. Low voltage QRS  If no changes, I anticipate pt can proceed with surgery as scheduled.   Willeen Cass, FNP-BC [facility]/Anesthesiology Phone: [phone] 03/23/2017 9:31 AM

## 2017-03-22 NOTE — Progress Notes (Signed)
SPOKE [place] NP, ABOUT HX DIIFICULT INTUBATION 10-11 YEARS AGO AT [facility].  PATIENT STATED SHE HAS HAD SURGERY SINCE AT [facility] AND DONE FINE.  PATIENT WAS EVALUATED BY ANGELA IN PAT.  SENT MESSAGE TO DR. CRAM TO SECOND SIGN ORDERS.  WILL NEED TO SIGN CONSENT DOS.

## 2017-03-23 DIAGNOSIS — E119 Type 2 diabetes mellitus without complications: Secondary | ICD-10-CM | POA: Diagnosis not present

## 2017-03-24 ENCOUNTER — Ambulatory Visit (HOSPITAL_COMMUNITY): Payer: Medicare HMO

## 2017-03-24 ENCOUNTER — Encounter (HOSPITAL_COMMUNITY): Payer: Self-pay | Admitting: General Practice

## 2017-03-24 ENCOUNTER — Ambulatory Visit (HOSPITAL_COMMUNITY)
Admission: RE | Admit: 2017-03-24 | Discharge: 2017-03-25 | Disposition: A | Discharge status: Home / Self Care | Payer: Medicare HMO | Source: Ambulatory Visit | Attending: Neurosurgery | Admitting: Neurosurgery

## 2017-03-24 ENCOUNTER — Other Ambulatory Visit: Payer: Self-pay

## 2017-03-24 ENCOUNTER — Ambulatory Visit (HOSPITAL_COMMUNITY): Payer: Medicare HMO | Admitting: Emergency Medicine

## 2017-03-24 ENCOUNTER — Ambulatory Visit (HOSPITAL_COMMUNITY): Payer: Medicare HMO | Admitting: Certified Registered Nurse Anesthetist

## 2017-03-24 ENCOUNTER — Encounter (HOSPITAL_COMMUNITY)
Admission: RE | Disposition: A | Discharge status: Home / Self Care | Payer: Self-pay | Source: Ambulatory Visit | Attending: Neurosurgery

## 2017-03-24 DIAGNOSIS — M5116 Intervertebral disc disorders with radiculopathy, lumbar region: Secondary | ICD-10-CM | POA: Diagnosis not present

## 2017-03-24 DIAGNOSIS — Z79890 Hormone replacement therapy (postmenopausal): Secondary | ICD-10-CM | POA: Insufficient documentation

## 2017-03-24 DIAGNOSIS — Z7951 Long term (current) use of inhaled steroids: Secondary | ICD-10-CM | POA: Insufficient documentation

## 2017-03-24 DIAGNOSIS — M48061 Spinal stenosis, lumbar region without neurogenic claudication: Secondary | ICD-10-CM | POA: Insufficient documentation

## 2017-03-24 DIAGNOSIS — M5117 Intervertebral disc disorders with radiculopathy, lumbosacral region: Secondary | ICD-10-CM | POA: Diagnosis not present

## 2017-03-24 DIAGNOSIS — M5126 Other intervertebral disc displacement, lumbar region: Secondary | ICD-10-CM | POA: Diagnosis not present

## 2017-03-24 DIAGNOSIS — Z7984 Long term (current) use of oral hypoglycemic drugs: Secondary | ICD-10-CM | POA: Insufficient documentation

## 2017-03-24 DIAGNOSIS — E039 Hypothyroidism, unspecified: Secondary | ICD-10-CM | POA: Insufficient documentation

## 2017-03-24 DIAGNOSIS — M4807 Spinal stenosis, lumbosacral region: Secondary | ICD-10-CM | POA: Diagnosis not present

## 2017-03-24 DIAGNOSIS — Z79899 Other long term (current) drug therapy: Secondary | ICD-10-CM | POA: Diagnosis not present

## 2017-03-24 DIAGNOSIS — K219 Gastro-esophageal reflux disease without esophagitis: Secondary | ICD-10-CM | POA: Diagnosis not present

## 2017-03-24 DIAGNOSIS — M797 Fibromyalgia: Secondary | ICD-10-CM | POA: Diagnosis not present

## 2017-03-24 DIAGNOSIS — Z981 Arthrodesis status: Secondary | ICD-10-CM | POA: Diagnosis not present

## 2017-03-24 DIAGNOSIS — Z419 Encounter for procedure for purposes other than remedying health state, unspecified: Secondary | ICD-10-CM

## 2017-03-24 DIAGNOSIS — I10 Essential (primary) hypertension: Secondary | ICD-10-CM | POA: Insufficient documentation

## 2017-03-24 DIAGNOSIS — E119 Type 2 diabetes mellitus without complications: Secondary | ICD-10-CM | POA: Diagnosis not present

## 2017-03-24 HISTORY — PX: LUMBAR LAMINECTOMY/DECOMPRESSION MICRODISCECTOMY: SHX5026

## 2017-03-24 LAB — GLUCOSE, CAPILLARY
GLUCOSE-CAPILLARY: 140 mg/dL — AB (ref 65–99)
GLUCOSE-CAPILLARY: 212 mg/dL — AB (ref 65–99)
Glucose-Capillary: 163 mg/dL — ABNORMAL HIGH (ref 65–99)
Glucose-Capillary: 254 mg/dL — ABNORMAL HIGH (ref 65–99)

## 2017-03-24 SURGERY — LUMBAR LAMINECTOMY/DECOMPRESSION MICRODISCECTOMY 2 LEVELS
Anesthesia: General | Site: Spine Lumbar | Laterality: Left

## 2017-03-24 MED ORDER — VITAMIN D 1000 UNITS PO TABS: 1000.0000 [IU] | ORAL_TABLET | ORAL | Status: DC

## 2017-03-24 MED ORDER — OXYBUTYNIN CHLORIDE ER 5 MG PO TB24
5.0000 mg | ORAL_TABLET | Freq: Every day | ORAL | Status: DC
  Administered 2017-03-24: 5 mg via ORAL
  Filled 2017-03-24: qty 1

## 2017-03-24 MED ORDER — 0.9 % SODIUM CHLORIDE (POUR BTL) OPTIME
TOPICAL | Status: DC | PRN
  Administered 2017-03-24: 1000 mL

## 2017-03-24 MED ORDER — ONDANSETRON HCL 4 MG/2ML IJ SOLN
INTRAMUSCULAR | Status: AC
  Filled 2017-03-24: qty 2

## 2017-03-24 MED ORDER — ACETAMINOPHEN 500 MG PO TABS: 1000.0000 mg | ORAL_TABLET | Freq: Two times a day (BID) | ORAL | Status: DC | PRN

## 2017-03-24 MED ORDER — SUGAMMADEX SODIUM 200 MG/2ML IV SOLN
INTRAVENOUS | Status: DC | PRN
  Administered 2017-03-24: 200 mg via INTRAVENOUS

## 2017-03-24 MED ORDER — PHENOL 1.4 % MT LIQD: 1.0000 | OROMUCOSAL | Status: DC | PRN

## 2017-03-24 MED ORDER — OXYCODONE HCL 5 MG/5ML PO SOLN: 5.0000 mg | Freq: Once | ORAL | Status: DC | PRN

## 2017-03-24 MED ORDER — FENTANYL CITRATE (PF) 100 MCG/2ML IJ SOLN
25.0000 ug | INTRAMUSCULAR | Status: DC | PRN
  Administered 2017-03-24 (×2): 50 ug via INTRAVENOUS

## 2017-03-24 MED ORDER — FLUTICASONE PROPIONATE 50 MCG/ACT NA SUSP: 2.0000 | Freq: Every day | NASAL | Status: DC | PRN

## 2017-03-24 MED ORDER — EPHEDRINE 5 MG/ML INJ
INTRAVENOUS | Status: AC
  Filled 2017-03-24: qty 20

## 2017-03-24 MED ORDER — DEXAMETHASONE SODIUM PHOSPHATE 10 MG/ML IJ SOLN
INTRAMUSCULAR | Status: AC
  Filled 2017-03-24: qty 1

## 2017-03-24 MED ORDER — AMLODIPINE BESYLATE 5 MG PO TABS
5.0000 mg | ORAL_TABLET | Freq: Every day | ORAL | Status: DC
  Administered 2017-03-25: 5 mg via ORAL
  Filled 2017-03-24: qty 1

## 2017-03-24 MED ORDER — ONDANSETRON HCL 4 MG/2ML IJ SOLN
INTRAMUSCULAR | Status: DC | PRN
  Administered 2017-03-24: 4 mg via INTRAVENOUS

## 2017-03-24 MED ORDER — THROMBIN 5000 UNITS EX SOLR
CUTANEOUS | Status: AC
  Filled 2017-03-24: qty 15000

## 2017-03-24 MED ORDER — INSULIN ASPART 100 UNIT/ML ~~LOC~~ SOLN
0.0000 [IU] | Freq: Every day | SUBCUTANEOUS | Status: DC
  Administered 2017-03-24: 2 [IU] via SUBCUTANEOUS

## 2017-03-24 MED ORDER — DEXAMETHASONE SODIUM PHOSPHATE 10 MG/ML IJ SOLN
INTRAMUSCULAR | Status: DC | PRN
  Administered 2017-03-24: 4 mg via INTRAVENOUS

## 2017-03-24 MED ORDER — ACETAMINOPHEN 650 MG RE SUPP: 650.0000 mg | RECTAL | Status: DC | PRN

## 2017-03-24 MED ORDER — ALUM & MAG HYDROXIDE-SIMETH 200-200-20 MG/5ML PO SUSP: 30.0000 mL | Freq: Four times a day (QID) | ORAL | Status: DC | PRN

## 2017-03-24 MED ORDER — ROCURONIUM BROMIDE 10 MG/ML (PF) SYRINGE
PREFILLED_SYRINGE | INTRAVENOUS | Status: AC
  Filled 2017-03-24: qty 10

## 2017-03-24 MED ORDER — LACTATED RINGERS IV SOLN
INTRAVENOUS | Status: DC
  Administered 2017-03-24: 10:00:00 via INTRAVENOUS

## 2017-03-24 MED ORDER — SUCCINYLCHOLINE CHLORIDE 20 MG/ML IJ SOLN
INTRAMUSCULAR | Status: AC
  Filled 2017-03-24: qty 1

## 2017-03-24 MED ORDER — SODIUM CHLORIDE 0.9% FLUSH: 3.0000 mL | INTRAVENOUS | Status: DC | PRN

## 2017-03-24 MED ORDER — BUPIVACAINE HCL (PF) 0.25 % IJ SOLN
INTRAMUSCULAR | Status: AC
  Filled 2017-03-24: qty 30

## 2017-03-24 MED ORDER — LIDOCAINE-EPINEPHRINE 1 %-1:100000 IJ SOLN
INTRAMUSCULAR | Status: AC
Start: 2017-03-24 — End: 2017-03-24
  Filled 2017-03-24: qty 1

## 2017-03-24 MED ORDER — PHENYLEPHRINE 40 MCG/ML (10ML) SYRINGE FOR IV PUSH (FOR BLOOD PRESSURE SUPPORT)
PREFILLED_SYRINGE | INTRAVENOUS | Status: AC
  Filled 2017-03-24: qty 10

## 2017-03-24 MED ORDER — CYCLOBENZAPRINE HCL 10 MG PO TABS
10.0000 mg | ORAL_TABLET | Freq: Three times a day (TID) | ORAL | Status: DC | PRN
  Administered 2017-03-24 (×2): 10 mg via ORAL
  Filled 2017-03-24: qty 1

## 2017-03-24 MED ORDER — ROCURONIUM BROMIDE 100 MG/10ML IV SOLN
INTRAVENOUS | Status: DC | PRN
  Administered 2017-03-24: 50 mg via INTRAVENOUS
  Administered 2017-03-24: 10 mg via INTRAVENOUS

## 2017-03-24 MED ORDER — LIDOCAINE 2% (20 MG/ML) 5 ML SYRINGE
INTRAMUSCULAR | Status: AC
  Filled 2017-03-24: qty 5

## 2017-03-24 MED ORDER — ACETAMINOPHEN 325 MG PO TABS: 650.0000 mg | ORAL_TABLET | ORAL | Status: DC | PRN

## 2017-03-24 MED ORDER — PROPOFOL 10 MG/ML IV BOLUS
INTRAVENOUS | Status: AC
  Filled 2017-03-24: qty 20

## 2017-03-24 MED ORDER — OXYCODONE HCL 5 MG PO TABS
ORAL_TABLET | ORAL | Status: AC
  Filled 2017-03-24: qty 2

## 2017-03-24 MED ORDER — FENTANYL CITRATE (PF) 250 MCG/5ML IJ SOLN
INTRAMUSCULAR | Status: AC
  Filled 2017-03-24: qty 5

## 2017-03-24 MED ORDER — MELOXICAM 7.5 MG PO TABS
15.0000 mg | ORAL_TABLET | Freq: Every day | ORAL | Status: DC
  Administered 2017-03-25: 15 mg via ORAL
  Filled 2017-03-24: qty 2

## 2017-03-24 MED ORDER — MENTHOL 3 MG MT LOZG: 1.0000 | LOZENGE | OROMUCOSAL | Status: DC | PRN

## 2017-03-24 MED ORDER — HEMOSTATIC AGENTS (NO CHARGE) OPTIME
TOPICAL | Status: DC | PRN
  Administered 2017-03-24: 1 via TOPICAL

## 2017-03-24 MED ORDER — FENTANYL CITRATE (PF) 100 MCG/2ML IJ SOLN
INTRAMUSCULAR | Status: DC | PRN
  Administered 2017-03-24 (×2): 50 ug via INTRAVENOUS
  Administered 2017-03-24 (×2): 75 ug via INTRAVENOUS

## 2017-03-24 MED ORDER — HYDROMORPHONE HCL 1 MG/ML IJ SOLN: 0.5000 mg | INTRAMUSCULAR | Status: DC | PRN

## 2017-03-24 MED ORDER — BENAZEPRIL HCL 20 MG PO TABS
20.0000 mg | ORAL_TABLET | Freq: Every day | ORAL | Status: DC
  Filled 2017-03-24 (×3): qty 1

## 2017-03-24 MED ORDER — LIDOCAINE-EPINEPHRINE 1 %-1:100000 IJ SOLN
INTRAMUSCULAR | Status: DC | PRN
  Administered 2017-03-24: 10 mL

## 2017-03-24 MED ORDER — METFORMIN HCL 500 MG PO TABS
1000.0000 mg | ORAL_TABLET | Freq: Two times a day (BID) | ORAL | Status: DC
  Administered 2017-03-24 – 2017-03-25 (×2): 1000 mg via ORAL
  Filled 2017-03-24 (×2): qty 2

## 2017-03-24 MED ORDER — OXYCODONE HCL 5 MG PO TABS
10.0000 mg | ORAL_TABLET | ORAL | Status: DC | PRN
  Administered 2017-03-24 – 2017-03-25 (×5): 10 mg via ORAL
  Filled 2017-03-24 (×4): qty 2

## 2017-03-24 MED ORDER — ONDANSETRON HCL 4 MG PO TABS: 4.0000 mg | ORAL_TABLET | Freq: Four times a day (QID) | ORAL | Status: DC | PRN

## 2017-03-24 MED ORDER — ONDANSETRON HCL 4 MG/2ML IJ SOLN: 4.0000 mg | Freq: Once | INTRAMUSCULAR | Status: DC | PRN

## 2017-03-24 MED ORDER — ONDANSETRON HCL 4 MG/2ML IJ SOLN: 4.0000 mg | Freq: Four times a day (QID) | INTRAMUSCULAR | Status: DC | PRN

## 2017-03-24 MED ORDER — LIDOCAINE HCL (CARDIAC) 20 MG/ML IV SOLN
INTRAVENOUS | Status: DC | PRN
  Administered 2017-03-24: 40 mg via INTRAVENOUS

## 2017-03-24 MED ORDER — SODIUM CHLORIDE 0.9 % IR SOLN
Status: DC | PRN
  Administered 2017-03-24: 12:00:00

## 2017-03-24 MED ORDER — PANTOPRAZOLE SODIUM 40 MG PO TBEC
40.0000 mg | DELAYED_RELEASE_TABLET | Freq: Every day | ORAL | Status: DC
  Administered 2017-03-25: 40 mg via ORAL
  Filled 2017-03-24: qty 1

## 2017-03-24 MED ORDER — PANTOPRAZOLE SODIUM 40 MG IV SOLR: 40.0000 mg | Freq: Every day | INTRAVENOUS | Status: DC

## 2017-03-24 MED ORDER — CYCLOBENZAPRINE HCL 10 MG PO TABS
ORAL_TABLET | ORAL | Status: AC
Start: 2017-03-24 — End: 2017-03-25
  Filled 2017-03-24: qty 1

## 2017-03-24 MED ORDER — LINAGLIPTIN 5 MG PO TABS
5.0000 mg | ORAL_TABLET | Freq: Every day | ORAL | Status: DC
  Administered 2017-03-24 – 2017-03-25 (×2): 5 mg via ORAL
  Filled 2017-03-24 (×3): qty 1

## 2017-03-24 MED ORDER — SIMVASTATIN 20 MG PO TABS
40.0000 mg | ORAL_TABLET | Freq: Every evening | ORAL | Status: DC
  Administered 2017-03-24: 40 mg via ORAL
  Filled 2017-03-24: qty 2

## 2017-03-24 MED ORDER — FENTANYL CITRATE (PF) 100 MCG/2ML IJ SOLN
INTRAMUSCULAR | Status: AC
  Filled 2017-03-24: qty 2

## 2017-03-24 MED ORDER — CEFAZOLIN SODIUM-DEXTROSE 2-3 GM-%(50ML) IV SOLR
INTRAVENOUS | Status: DC | PRN
  Administered 2017-03-24: 2 g via INTRAVENOUS

## 2017-03-24 MED ORDER — LACTATED RINGERS IV SOLN
INTRAVENOUS | Status: DC | PRN
  Administered 2017-03-24 (×2): via INTRAVENOUS

## 2017-03-24 MED ORDER — CEFAZOLIN SODIUM-DEXTROSE 2-4 GM/100ML-% IV SOLN
2.0000 g | Freq: Three times a day (TID) | INTRAVENOUS | Status: AC
  Administered 2017-03-24 – 2017-03-25 (×2): 2 g via INTRAVENOUS
  Filled 2017-03-24 (×2): qty 100

## 2017-03-24 MED ORDER — BUPIVACAINE HCL (PF) 0.25 % IJ SOLN
INTRAMUSCULAR | Status: DC | PRN
Start: 2017-03-24 — End: 2017-03-24
  Administered 2017-03-24: 10 mL

## 2017-03-24 MED ORDER — THROMBIN (RECOMBINANT) 5000 UNITS EX SOLR
CUTANEOUS | Status: DC | PRN
  Administered 2017-03-24: 12:00:00 via TOPICAL

## 2017-03-24 MED ORDER — INSULIN ASPART 100 UNIT/ML ~~LOC~~ SOLN
0.0000 [IU] | Freq: Three times a day (TID) | SUBCUTANEOUS | Status: DC
  Administered 2017-03-24: 8 [IU] via SUBCUTANEOUS
  Administered 2017-03-25: 2 [IU] via SUBCUTANEOUS

## 2017-03-24 MED ORDER — OXYCODONE HCL 5 MG PO TABS: 5.0000 mg | ORAL_TABLET | Freq: Once | ORAL | Status: DC | PRN

## 2017-03-24 MED ORDER — CEFAZOLIN SODIUM-DEXTROSE 2-4 GM/100ML-% IV SOLN
INTRAVENOUS | Status: AC
  Filled 2017-03-24: qty 100

## 2017-03-24 MED ORDER — LEVOTHYROXINE SODIUM 75 MCG PO TABS
75.0000 ug | ORAL_TABLET | Freq: Every day | ORAL | Status: DC
  Administered 2017-03-25: 75 ug via ORAL
  Filled 2017-03-24: qty 1

## 2017-03-24 MED ORDER — THROMBIN (RECOMBINANT) 5000 UNITS EX SOLR
CUTANEOUS | Status: DC | PRN
  Administered 2017-03-24 (×2): 5000 [IU] via TOPICAL

## 2017-03-24 MED ORDER — SODIUM CHLORIDE 0.9 % IV SOLN: 250.0000 mL | INTRAVENOUS | Status: DC

## 2017-03-24 MED ORDER — PROPOFOL 10 MG/ML IV BOLUS
INTRAVENOUS | Status: DC | PRN
  Administered 2017-03-24: 170 mg via INTRAVENOUS

## 2017-03-24 MED ORDER — PHENYLEPHRINE HCL 10 MG/ML IJ SOLN
INTRAMUSCULAR | Status: DC | PRN
  Administered 2017-03-24 (×4): 80 ug via INTRAVENOUS

## 2017-03-24 MED ORDER — SODIUM CHLORIDE 0.9% FLUSH
3.0000 mL | Freq: Two times a day (BID) | INTRAVENOUS | Status: DC
  Administered 2017-03-24 – 2017-03-25 (×3): 3 mL via INTRAVENOUS

## 2017-03-24 SURGICAL SUPPLY — 55 items
BAG DECANTER FOR FLEXI CONT (MISCELLANEOUS) ×2 IMPLANT
BENZOIN TINCTURE PRP APPL 2/3 (GAUZE/BANDAGES/DRESSINGS) ×2 IMPLANT
BLADE SURG 11 STRL SS (BLADE) ×2 IMPLANT
BUR CUTTER 7.0 ROUND (BURR) ×2 IMPLANT
BUR MATCHSTICK NEURO 3.0 LAGG (BURR) ×2 IMPLANT
CANISTER SUCT 3000ML PPV (MISCELLANEOUS) ×2 IMPLANT
CARTRIDGE OIL MAESTRO DRILL (MISCELLANEOUS) ×1 IMPLANT
DECANTER SPIKE VIAL GLASS SM (MISCELLANEOUS) ×2 IMPLANT
DERMABOND ADVANCED (GAUZE/BANDAGES/DRESSINGS) ×1
DERMABOND ADVANCED .7 DNX12 (GAUZE/BANDAGES/DRESSINGS) ×1 IMPLANT
DIFFUSER DRILL AIR PNEUMATIC (MISCELLANEOUS) ×2 IMPLANT
DRAPE HALF SHEET 40X57 (DRAPES) IMPLANT
DRAPE LAPAROTOMY 100X72X124 (DRAPES) ×2 IMPLANT
DRAPE MICROSCOPE LEICA (MISCELLANEOUS) ×2 IMPLANT
DRAPE POUCH INSTRU U-SHP 10X18 (DRAPES) ×2 IMPLANT
DRAPE SURG 17X23 STRL (DRAPES) ×2 IMPLANT
DRSG OPSITE POSTOP 4X6 (GAUZE/BANDAGES/DRESSINGS) ×2 IMPLANT
DURAPREP 26ML APPLICATOR (WOUND CARE) ×2 IMPLANT
ELECT REM PT RETURN 9FT ADLT (ELECTROSURGICAL) ×2
ELECTRODE REM PT RTRN 9FT ADLT (ELECTROSURGICAL) ×1 IMPLANT
GAUZE SPONGE 4X4 12PLY STRL (GAUZE/BANDAGES/DRESSINGS) ×2 IMPLANT
GAUZE SPONGE 4X4 16PLY XRAY LF (GAUZE/BANDAGES/DRESSINGS) IMPLANT
GLOVE BIO SURGEON STRL SZ7 (GLOVE) IMPLANT
GLOVE BIO SURGEON STRL SZ8 (GLOVE) ×2 IMPLANT
GLOVE BIOGEL PI IND STRL 7.0 (GLOVE) IMPLANT
GLOVE BIOGEL PI INDICATOR 7.0 (GLOVE)
GLOVE ECLIPSE 6.5 STRL STRAW (GLOVE) ×2 IMPLANT
GLOVE ECLIPSE 7.5 STRL STRAW (GLOVE) IMPLANT
GLOVE EXAM NITRILE LRG STRL (GLOVE) IMPLANT
GLOVE EXAM NITRILE XL STR (GLOVE) IMPLANT
GLOVE EXAM NITRILE XS STR PU (GLOVE) IMPLANT
GLOVE INDICATOR 8.5 STRL (GLOVE) ×2 IMPLANT
GOWN STRL REUS W/ TWL LRG LVL3 (GOWN DISPOSABLE) ×1 IMPLANT
GOWN STRL REUS W/ TWL XL LVL3 (GOWN DISPOSABLE) ×2 IMPLANT
GOWN STRL REUS W/TWL 2XL LVL3 (GOWN DISPOSABLE) IMPLANT
GOWN STRL REUS W/TWL LRG LVL3 (GOWN DISPOSABLE) ×1
GOWN STRL REUS W/TWL XL LVL3 (GOWN DISPOSABLE) ×2
HEMOSTAT POWDER KIT SURGIFOAM (HEMOSTASIS) ×2 IMPLANT
KIT BASIN OR (CUSTOM PROCEDURE TRAY) ×2 IMPLANT
KIT ROOM TURNOVER OR (KITS) ×2 IMPLANT
NEEDLE HYPO 22GX1.5 SAFETY (NEEDLE) ×2 IMPLANT
NEEDLE SPNL 22GX3.5 QUINCKE BK (NEEDLE) ×2 IMPLANT
NS IRRIG 1000ML POUR BTL (IV SOLUTION) ×2 IMPLANT
OIL CARTRIDGE MAESTRO DRILL (MISCELLANEOUS) ×2
PACK LAMINECTOMY NEURO (CUSTOM PROCEDURE TRAY) ×2 IMPLANT
RUBBERBAND STERILE (MISCELLANEOUS) ×4 IMPLANT
SPONGE SURGIFOAM ABS GEL SZ50 (HEMOSTASIS) ×2 IMPLANT
STRIP CLOSURE SKIN 1/2X4 (GAUZE/BANDAGES/DRESSINGS) ×2 IMPLANT
SUT VIC AB 0 CT1 18XCR BRD8 (SUTURE) ×1 IMPLANT
SUT VIC AB 0 CT1 8-18 (SUTURE) ×1
SUT VIC AB 2-0 CT1 18 (SUTURE) ×2 IMPLANT
SUT VICRYL 4-0 PS2 18IN ABS (SUTURE) ×2 IMPLANT
TOWEL GREEN STERILE (TOWEL DISPOSABLE) ×2 IMPLANT
TOWEL GREEN STERILE FF (TOWEL DISPOSABLE) ×2 IMPLANT
WATER STERILE IRR 1000ML POUR (IV SOLUTION) ×2 IMPLANT

## 2017-03-24 NOTE — Anesthesia Procedure Notes (Signed)
Procedure Name: Intubation Date/Time: 03/24/2017 11:12 AM Performed by: Inda Coke, CRNA Pre-anesthesia Checklist: Patient identified, Emergency Drugs available, Suction available and Patient being monitored Patient Re-evaluated:Patient Re-evaluated prior to induction Oxygen Delivery Method: Circle System Utilized Preoxygenation: Pre-oxygenation with 100% oxygen Induction Type: IV induction Ventilation: Mask ventilation without difficulty and Oral airway inserted - appropriate to patient size Laryngoscope Size: Mac and 4 Grade View: Grade II Tube type: Oral Number of attempts: 1 Airway Equipment and Method: Stylet and Oral airway Placement Confirmation: ETT inserted through vocal cords under direct vision,  positive ETCO2 and breath sounds checked- equal and bilateral Secured at: 22 cm Tube secured with: Tape Dental Injury: Teeth and Oropharynx as per pre-operative assessment

## 2017-03-24 NOTE — Anesthesia Preprocedure Evaluation (Signed)
Anesthesia Evaluation  Patient identified by MRN, date of birth, ID band Patient awake    Reviewed: Allergy & Precautions, NPO status , Patient's Chart, lab work & pertinent test results  Airway Mallampati: II  TM Distance: >3 FB Neck ROM: Full    Dental  (+) Teeth Intact, Dental Advisory Given   Pulmonary    breath sounds clear to auscultation       Cardiovascular hypertension,  Rhythm:Regular Rate:Normal     Neuro/Psych    GI/Hepatic   Endo/Other  diabetes  Renal/GU      Musculoskeletal   Abdominal   Peds  Hematology   Anesthesia Other Findings   Reproductive/Obstetrics                             Anesthesia Physical Anesthesia Plan  ASA: III  Anesthesia Plan: General   Post-op Pain Management:    Induction: Intravenous  PONV Risk Score and Plan: Ondansetron and Dexamethasone  Airway Management Planned: Oral ETT  Additional Equipment:   Intra-op Plan:   Post-operative Plan: Extubation in OR  Informed Consent: I have reviewed the patients History and Physical, chart, labs and discussed the procedure including the risks, benefits and alternatives for the proposed anesthesia with the patient or authorized representative who has indicated his/her understanding and acceptance.     Dental advisory given  Plan Discussed with: CRNA and Anesthesiologist  Anesthesia Plan Comments:         Anesthesia Quick Evaluation  

## 2017-03-24 NOTE — Op Note (Signed)
Preoperative diagnosis: Lumbar spinal stenosis left L4-5 with left L5 radiculopathy and herniated nucleus pulposis left L5-S1 with left S1 radiculopathy  Postoperative diagnosis: Same  Procedure: #1 decompressive lumbar laminectomy partial facetectomy and foraminotomies at L4-5 on the left with microdissection of the left L5 nerve root  #2 lumbar microdiscectomy L5-S1 the left with microdissection of the left S1 nerve root microscopic discectomy  Surgeon: Dominica Severin Jaiden Dinkins  Asst.: Dayton Bailiff  Anesthesia: Gen.  EBL: Minimal  History of present illness: 77 year old female with long-standing back and left leg pain refractory to all forms of conservative treatment. Workup revealed severe spinal stenosis at L4-5 on the left. In addition also disc herniation L5-S1 left. Symptomatology was consistent with left L5 and S1 radiculopathies. Because of failure conservative treatment imaging findings and progression of clinical syndrome I recommended laminectomies at L4-5 and L5-S1 decompression from stenosis at L4-5 and for micro-discectomy at L5-S1. I extensively reviewed the risks and benefits of the operation with the patient as well as perioperative course expectations of outcome and alternatives of surgery and she understood and agreed to proceed forward.  Operative procedure: Patient brought into the or was induced on general anesthesia positioned prone the Wilson frame her back was prepped and draped in routine sterile fashion preoperative x-ray localize the appropriate level so after infiltration of 10 mL lidocaine with epi a midline incision was made and Bovie light cautery was used to take down the subcutaneous tissues and subperiosteal dissections care lamina of L4-L5 and S1. Interoperative x-ray confirmed defecation appropriate level. So the inferior aspect of the lamina of the left L4 lamina was drilled down a high-speed drill as well as medial facet complex super aspect of lamina of L5 and also on the  left the inferior aspect of lamina of L5 medial facet complex super aspect of lamina S1 was drilled as well. Laminotomies were begun with a 3 and 4 mm Kerrison punch. First working at L4-5 the ligamentum flavum was identified and removed in piecemeal fashion was noted markedly hypertrophied. There was a large medial facet spur causing severe thecal sac compression and proximal L5 nerve root compression. This was all under bitten and the L5 foramen was unroofed at the level of the L4-5 disc space. Under microscopic illumination further underwent a medial gutter allowed decompression of thecal sac and L5 nerve root. Disc space was inspected was felt not to be herniated epidural veins coagulated I could palpate the undersurface of 4 foramina which was widely patent as well as distally L5 foramen which was also widely patent. Tensions and taken L5-S1 a similar fashion L5-S1 laminotomy was begun with 3 and 4 mg Kerrison punch ligament was removed in piecemeal fashion identified immediately the S2 nerve root and the S1 nerve root coming out from underneath the medial aspect of facet joint. Further under biting of the medial facet joint allowed indication S1 pedicle and marching superiorly identified the L5-S1 disc space which was noted be herniated partially calcified and causing severe thecal sac compression. So annulotomy was made with 11 blade scalpel under Mike's cup illumination disc space was cleaned out with pituitary rongeurs and Epstein curettes. At the end the discectomy was no further stenosis centrally the S1 nerve root was unroofed further and this was decompressed. And no further stenosis was appreciated. Both incisions both laminotomies were copiously irrigated meticulous he states was maintained Gelfoam was laid top of the dura the muscle fascia proximal in layers with interrupted Vicryl and skin was closed with running 4  subcuticular. Dermabond benzo and Steri-Strips and sterile dressing was applied and  patient recovered in stable condition. At the end of case all needle counts sponge counts were correct.

## 2017-03-24 NOTE — H&P (Signed)
Jennifer Boyer is an 77 y.o. female.   Chief Complaint: Back and left leg pain HPI: 77 year old female with progressive worsening back and left leg pain refractory to all forms of conservative treatment. Workup revealed severe spinal stenosis on the left at L4-5 and stenosis and a disc bulge on the left at L5-S1. Due to patient's failure of conservative ment, imaging findings, and progression of clinical syndrome I recommended decompressive laminotomy on the left at L4-5 and lumbar microdiscectomy on the lft at L5-S1. I have extensivelyGone over the risks and benefits, The perioperative course expectations of outcome and alternatives to surgery and the patient understands and agrees to proceed forward.  Past Medical History:  Diagnosis Date  . Arthritis    OA  . Complication of anesthesia    pt has been told she is hard to intubate.  . Diabetes mellitus without complication ([place])   . Difficult intubation   . Fibromyalgia   . GERD (gastroesophageal reflux disease)   . Headache    HX MIGRAINES  NONE IN LONG TIME  . History of hiatal hernia   . Hypertension   . Hypothyroidism     Past Surgical History:  Procedure Laterality Date  . BREAST SURGERY     bilateral mastectomy  . CHOLECYSTECTOMY    . DILATATION & CURETTAGE/HYSTEROSCOPY WITH TRUECLEAR N/A 09/30/2012   Procedure: DILATATION & CURETTAGE/HYSTEROSCOPY WITH TRUECLEAR;  Surgeon: Shon Millet II, MD;  Location: [facility];  Service: Gynecology;  Laterality: N/A;  . DILATION AND CURETTAGE OF UTERUS     uterine polyp  . KNEE SURGERY    . TONSILLECTOMY      History reviewed. No pertinent family history. Social History:  reports that  has never smoked. she has never used smokeless tobacco. She reports that she does not drink alcohol or use drugs.  Allergies: No Known Allergies  Medications Prior to Admission  Medication Sig Dispense Refill  . acetaminophen (TYLENOL) 500 MG tablet Take 1,000 mg by mouth 2 (two) times daily as  needed (for pain.).    Marland Kitchen amLODipine (NORVASC) 5 MG tablet Take 5 mg by mouth daily.    . benazepril (LOTENSIN) 20 MG tablet Take 20 mg by mouth daily.    . cholecalciferol (VITAMIN D) 1000 units tablet Take 1,000 Units by mouth every 3 (three) months.    . levothyroxine (SYNTHROID, LEVOTHROID) 75 MCG tablet Take 75 mcg by mouth daily before breakfast.    . Liniments (SALONPAS PAIN RELIEF PATCH EX) Place 1 patch onto the skin daily as needed (for pain.).    Marland Kitchen meloxicam (MOBIC) 15 MG tablet Take 1 tablet (15 mg total) by mouth daily. 30 tablet 1  . metFORMIN (GLUCOPHAGE) 1000 MG tablet Take 1,000 mg by mouth 2 (two) times daily with a meal.    . pantoprazole (PROTONIX) 40 MG tablet Take 40 mg by mouth daily.    . simvastatin (ZOCOR) 40 MG tablet Take 40 mg by mouth every evening.    . sitaGLIPtin (JANUVIA) 100 MG tablet Take 100 mg by mouth daily.    Marland Kitchen tolterodine (DETROL) 2 MG tablet Take 2 mg by mouth 2 (two) times daily.    . fluticasone (FLONASE) 50 MCG/ACT nasal spray Place 2 sprays into the nose daily as needed for allergies.       Results for orders placed or performed during the hospital encounter of 03/24/17 (from the past 48 hour(s))  Glucose, capillary     Status: Abnormal   Collection Time:  03/24/17  9:46 AM  Result Value Ref Range   Glucose-Capillary 163 (H) 65 - 99 mg/dL   No results found.  Review of Systems  Musculoskeletal: Positive for back pain, joint pain and myalgias.  Neurological: Positive for sensory change.    Blood pressure (!) 148/74, pulse 89, temperature 98.2 F (36.8 C), resp. rate 20, height 5\' 6"  (1.676 m), weight 102.5 kg (226 lb), SpO2 98 %. Physical Exam  Constitutional: She is oriented to person, place, and time. She appears well-developed.  HENT:  Head: Normocephalic.  Eyes: Pupils are equal, round, and reactive to light.  Neck: Normal range of motion.  Respiratory: Effort normal.  GI: Soft.  Neurological: She is alert and oriented to person,  place, and time. She has normal strength. GCS eye subscore is 4. GCS verbal subscore is 5. GCS motor subscore is 6.  Strength is 5 of 5 iliopsoas, quads, hamstrings, gastric, into tibialis, EHL.     Assessment/Plan 77 year old female presents for a decompressive laminectomy L4-5 on the left lumbar microdiscectomy L5-S1 on the left  Jennifer Boyer P, MD 03/24/2017, 10:33 AM

## 2017-03-24 NOTE — Anesthesia Postprocedure Evaluation (Signed)
Anesthesia Post Note  Patient: DELAYLA HOFFMASTER  Procedure(s) Performed: LEFT LUMBAR FIVE-SACRAL ONE MICRODISCECTOMY, LEFT LUMBAR FOUR-FIVE DECOMPRESSIVE LAMINECTOMY (Left Spine Lumbar)     Patient location during evaluation: PACU Anesthesia Type: General Level of consciousness: awake and alert Pain management: pain level controlled Vital Signs Assessment: post-procedure vital signs reviewed and stable Respiratory status: spontaneous breathing, nonlabored ventilation, respiratory function stable and patient connected to nasal cannula oxygen Cardiovascular status: blood pressure returned to baseline and stable Postop Assessment: no apparent nausea or vomiting Anesthetic complications: no    Last Vitals:  Vitals:   03/24/17 1405 03/24/17 1711  BP: (!) 142/75 127/71  Pulse: 72 66  Resp: 16 16  Temp: 36.7 C 36.6 C  SpO2: 99% 99%    Last Pain:  Vitals:   03/24/17 1759  TempSrc:   PainSc: 7                  Zniyah Midkiff COKER

## 2017-03-24 NOTE — Evaluation (Signed)
Physical Therapy Evaluation Patient Details Name: Jennifer Boyer MRN: [id number] DOB: 05-03-1940 Today's Date: 03/24/2017   History of Present Illness  Pt is a 77 y/o female s/p L4-5 laminectomy and foraminotomies. PMH inlucdes fibromyalgia, DM, HTN, and bilat mastectomy.  Clinical Impression  Patient is s/p above surgery resulting in the deficits listed below (see PT Problem List). Pt limited in gait tolerance secondary to pain. Slightly unsteady with use of RW and required min to min guard A for mobility. Reviewed back precautions with pt. Patient will benefit from skilled PT to increase their independence and safety with mobility (while adhering to their precautions) to allow discharge to the venue listed below.     Follow Up Recommendations No PT follow up;Supervision for mobility/OOB    Equipment Recommendations  Rolling walker with 5" wheels;3in1 (PT)    Recommendations for Other Services       Precautions / Restrictions Precautions Precautions: Back Precaution Booklet Issued: Yes (comment) Precaution Comments: Reviewed back precautions with pt.  Restrictions Weight Bearing Restrictions: No      Mobility  Bed Mobility Overal bed mobility: Needs Assistance Bed Mobility: Rolling;Sidelying to Sit;Sit to Sidelying Rolling: Min assist Sidelying to sit: Min assist     Sit to sidelying: Min assist General bed mobility comments: Min A to roll onto side and for trunk elevation to come up to sitting. Required min A for LE lift assist for return to supine. Verbal cues for log roll technique   Transfers Overall transfer level: Needs assistance Equipment used: Rolling walker (2 wheeled) Transfers: Sit to/from Stand Sit to Stand: Min assist         General transfer comment: Min A for lift assist and steadying assist to come to standing. Verbal cues for safe hand placement.   Ambulation/Gait Ambulation/Gait assistance: Min guard Ambulation Distance (Feet): 75  Feet Assistive device: Rolling walker (2 wheeled) Gait Pattern/deviations: Step-through pattern;Decreased stride length Gait velocity: Decreased  Gait velocity interpretation: Below normal speed for age/gender General Gait Details: Very slow, guarded gait. Slight unsteadiness noted with use of RW. PT heavily reliant on UEs. Educated about generalized walking program to perform at home.   Stairs            Wheelchair Mobility    Modified Rankin (Stroke Patients Only)       Balance Overall balance assessment: Needs assistance Sitting-balance support: No upper extremity supported;Feet supported Sitting balance-Leahy Scale: Good     Standing balance support: Bilateral upper extremity supported;During functional activity Standing balance-Leahy Scale: Poor Standing balance comment: Reliant on BUE support                              Pertinent Vitals/Pain Pain Assessment: Faces Faces Pain Scale: Hurts whole lot Pain Location: back  Pain Descriptors / Indicators: Aching;Grimacing;Operative site guarding Pain Intervention(s): Monitored during session;Limited activity within patient's tolerance;Repositioned    Home Living Family/patient expects to be discharged to:: Private residence Living Arrangements: Spouse/significant other Available Help at Discharge: Family;Available 24 hours/day Type of Home: House Home Access: Level entry     Home Layout: One level Home Equipment: Walker - 4 wheels;Cane - single point;Shower seat - built in      Prior Function Level of Independence: Independent         Comments: Reports she was using furniture and walls for balance.      Hand Dominance        Extremity/Trunk Assessment  Upper Extremity Assessment Upper Extremity Assessment: Overall WFL for tasks assessed    Lower Extremity Assessment Lower Extremity Assessment: Generalized weakness    Cervical / Trunk Assessment Cervical / Trunk Assessment: Other  exceptions Cervical / Trunk Exceptions: s/p lumbar surgery   Communication   Communication: No difficulties  Cognition Arousal/Alertness: Awake/alert Behavior During Therapy: WFL for tasks assessed/performed Overall Cognitive Status: Within Functional Limits for tasks assessed                                        General Comments General comments (skin integrity, edema, etc.): Pt's husband and sister in law present during session.     Exercises     Assessment/Plan    PT Assessment Patient needs continued PT services  PT Problem List Decreased strength;Decreased balance;Decreased activity tolerance;Decreased mobility;Decreased knowledge of use of DME;Decreased knowledge of precautions;Pain       PT Treatment Interventions DME instruction;Gait training;Therapeutic activities;Functional mobility training;Therapeutic exercise;Balance training;Neuromuscular re-education;Patient/family education    PT Goals (Current goals can be found in the Care Plan section)  Acute Rehab PT Goals Patient Stated Goal: to feel better  PT Goal Formulation: With patient Time For Goal Achievement: 04/07/17 Potential to Achieve Goals: Good    Frequency Min 5X/week   Barriers to discharge        Co-evaluation               AM-PAC PT "6 Clicks" Daily Activity  Outcome Measure Difficulty turning over in bed (including adjusting bedclothes, sheets and blankets)?: Unable Difficulty moving from lying on back to sitting on the side of the bed? : Unable Difficulty sitting down on and standing up from a chair with arms (e.g., wheelchair, bedside commode, etc,.)?: Unable Help needed moving to and from a bed to chair (including a wheelchair)?: A Little Help needed walking in hospital room?: A Little Help needed climbing 3-5 steps with a railing? : A Lot 6 Click Score: 11    End of Session Equipment Utilized During Treatment: Gait belt Activity Tolerance: Patient limited by  pain Patient left: in bed;with call bell/phone within reach;with family/visitor present Nurse Communication: Mobility status PT Visit Diagnosis: Unsteadiness on feet (R26.81);Other abnormalities of gait and mobility (R26.89);Pain Pain - part of body: (back )    Time: [id number] PT Time Calculation (min) (ACUTE ONLY): 25 min   Charges:   PT Evaluation $PT Eval Low Complexity: 1 Low PT Treatments $Gait Training: 8-22 mins   PT G Codes:        Leighton Ruff, PT, DPT  Acute Rehabilitation Services  Pager: [phone]   Rudean Hitt 03/24/2017, 4:09 PM

## 2017-03-24 NOTE — Transfer of Care (Signed)
Immediate Anesthesia Transfer of Care Note  Patient: Jennifer Boyer  Procedure(s) Performed: LEFT LUMBAR FIVE-SACRAL ONE MICRODISCECTOMY, LEFT LUMBAR FOUR-FIVE DECOMPRESSIVE LAMINECTOMY (Left Spine Lumbar)  Patient Location: PACU  Anesthesia Type:General  Level of Consciousness: awake and alert   Airway & Oxygen Therapy: Patient Spontanous Breathing and Patient connected to nasal cannula oxygen  Post-op Assessment: Report given to RN and Post -op Vital signs reviewed and stable  Post vital signs: Reviewed and stable  Last Vitals:  Vitals:   03/24/17 1255 03/24/17 1256  BP: (!) 150/76 (!) 150/76  Pulse:  84  Resp:  14  Temp:  36.8 C  SpO2:  100%    Last Pain:  Vitals:   03/24/17 1256  PainSc: 4       Patients Stated Pain Goal: 3 (59/74/16 3845)  Complications: No apparent anesthesia complications

## 2017-03-25 ENCOUNTER — Encounter (HOSPITAL_COMMUNITY): Payer: Self-pay | Admitting: Neurosurgery

## 2017-03-25 DIAGNOSIS — M5116 Intervertebral disc disorders with radiculopathy, lumbar region: Secondary | ICD-10-CM | POA: Diagnosis not present

## 2017-03-25 LAB — GLUCOSE, CAPILLARY: GLUCOSE-CAPILLARY: 142 mg/dL — AB (ref 65–99)

## 2017-03-25 MED ORDER — CYCLOBENZAPRINE HCL 10 MG PO TABS: 10.0000 mg | ORAL_TABLET | Freq: Three times a day (TID) | ORAL | 0 refills | Status: DC | PRN

## 2017-03-25 MED ORDER — OXYCODONE HCL 10 MG PO TABS: 10.0000 mg | ORAL_TABLET | ORAL | 0 refills | Status: DC | PRN

## 2017-03-25 MED FILL — Thrombin For Soln 5000 Unit: CUTANEOUS | Qty: 2 | Status: AC

## 2017-03-25 MED FILL — Thrombin For Soln 5000 Unit: CUTANEOUS | Qty: 5000 | Status: AC

## 2017-03-25 NOTE — Progress Notes (Signed)
Patient alert and oriented, mae's well, voiding adequate amount of urine, swallowing without difficulty, no c/o pain at time of discharge. Patient discharged home with family. Script and discharged instructions given to patient. Patient and family stated understanding of instructions given. Patient has an appointment with Dr. Cram 

## 2017-03-25 NOTE — Progress Notes (Signed)
Physical Therapy Treatment Patient Details Name: Jennifer Boyer MRN: [id number] DOB: 1940/02/23 Today's Date: 03/25/2017    History of Present Illness Pt is a 77 y/o female s/p L4-5 laminectomy and foraminotomies. PMH inlucdes fibromyalgia, DM, HTN, and bilat mastectomy.    PT Comments    Pt progressing towards physical therapy goals. Was able to perform transfers and ambulation with gross min guard assist to supervision for safety. Pt required assist for LE elevation up into bed however was able to perform all other mobility without assistance. Pt was educated on precautions, car transfer, and generalized walking program with safe activity progression. Will continue to follow and progress as able per POC.    Follow Up Recommendations  No PT follow up;Supervision for mobility/OOB     Equipment Recommendations  Rolling walker with 5" wheels;3in1 (PT)    Recommendations for Other Services       Precautions / Restrictions Precautions Precautions: Back Precaution Booklet Issued: Yes (comment) Precaution Comments: Reviewed back precautions with pt.  Restrictions Weight Bearing Restrictions: No    Mobility  Bed Mobility Overal bed mobility: Needs Assistance Bed Mobility: Rolling;Sit to Sidelying Rolling: Supervision       Sit to sidelying: Min assist General bed mobility comments: Assist for LE elevation up into bed. Pt required use of rails and increased time to position herself in bed.   Transfers Overall transfer level: Needs assistance Equipment used: Rolling walker (2 wheeled) Transfers: Sit to/from Stand Sit to Stand: Supervision         General transfer comment: Supervision and VC's for safety as pt initiated stand>sit.   Ambulation/Gait Ambulation/Gait assistance: Min guard;Supervision Ambulation Distance (Feet): 200 Feet Assistive device: Rolling walker (2 wheeled) Gait Pattern/deviations: Step-through pattern;Decreased stride length Gait velocity:  Decreased  Gait velocity interpretation: Below normal speed for age/gender General Gait Details: Slow and guarded. Initially pt at a min guard level however progressed to supervision for safety by end of gait training.    Stairs            Wheelchair Mobility    Modified Rankin (Stroke Patients Only)       Balance Overall balance assessment: Needs assistance Sitting-balance support: No upper extremity supported;Feet supported Sitting balance-Leahy Scale: Good     Standing balance support: Bilateral upper extremity supported;During functional activity Standing balance-Leahy Scale: Poor Standing balance comment: Reliant on BUE support for balance                            Cognition Arousal/Alertness: Awake/alert Behavior During Therapy: WFL for tasks assessed/performed Overall Cognitive Status: Within Functional Limits for tasks assessed                                        Exercises      General Comments        Pertinent Vitals/Pain Pain Assessment: Faces Faces Pain Scale: Hurts even more Pain Location: back  Pain Descriptors / Indicators: Aching;Grimacing;Operative site guarding Pain Intervention(s): Limited activity within patient's tolerance;Monitored during session;Repositioned    Home Living                      Prior Function            PT Goals (current goals can now be found in the care plan section) Acute Rehab PT Goals Patient Stated Goal:  to feel better  PT Goal Formulation: With patient Time For Goal Achievement: 04/07/17 Potential to Achieve Goals: Good Progress towards PT goals: Progressing toward goals    Frequency    Min 5X/week      PT Plan Current plan remains appropriate    Co-evaluation              AM-PAC PT "6 Clicks" Daily Activity  Outcome Measure  Difficulty turning over in bed (including adjusting bedclothes, sheets and blankets)?: A Little Difficulty moving from  lying on back to sitting on the side of the bed? : A Little Difficulty sitting down on and standing up from a chair with arms (e.g., wheelchair, bedside commode, etc,.)?: A Little Help needed moving to and from a bed to chair (including a wheelchair)?: A Little Help needed walking in hospital room?: A Little Help needed climbing 3-5 steps with a railing? : A Little 6 Click Score: 18    End of Session Equipment Utilized During Treatment: Gait belt Activity Tolerance: Patient limited by pain Patient left: in bed;with call bell/phone within reach;with family/visitor present Nurse Communication: Mobility status PT Visit Diagnosis: Unsteadiness on feet (R26.81);Other abnormalities of gait and mobility (R26.89);Pain Pain - part of body: (back )     Time: 8250-0370 PT Time Calculation (min) (ACUTE ONLY): 19 min  Charges:  $Gait Training: 8-22 mins                    G Codes:       Rolinda Roan, PT, DPT Acute Rehabilitation Services Pager: [phone]    Thelma Comp 03/25/2017, 8:43 AM

## 2017-03-25 NOTE — Discharge Instructions (Signed)

## 2017-03-25 NOTE — Discharge Summary (Signed)
  Physician Discharge Summary  Patient ID: Jennifer Boyer MRN: [id number] DOB/AGE: October 16, 1940 77 y.o. Estimated body mass index is 36.48 kg/m as calculated from the following:   Height as of this encounter: 5\' 6"  (1.676 m).   Weight as of this encounter: 102.5 kg (226 lb).   Admit date: 03/24/2017 Discharge date: 03/25/2017  Admission Diagnoses: Lumbar spinal stenosis herniated nucleus pulposus L4-5 L5-S1  Discharge Diagnoses: Same Active Problems:   HNP (herniated nucleus pulposus), lumbar   Discharged Condition: good  Hospital Course: Patient is admitted hospital underwent decompressive laminotomy L4-5 on the left and lumbar microscopic discectomy L5-S1 left. On postop day 1 patient was doing very well ambulating and voiding spontaneously tolerating regular diet stable on by mouth pain medication. She'll be discharged home scheduled follow-up in 2 weeks.  Consults: Significant Diagnostic Studies: Treatments: Decompressive lumbar laminotomy left L4-5 lumbar discectomy left L5-S1 Discharge Exam: Blood pressure (!) 118/57, pulse 67, temperature 98.3 F (36.8 C), temperature source Oral, resp. rate 18, height 5\' 6"  (1.676 m), weight 102.5 kg (226 lb), SpO2 97 %. Strength out of 5 wound clean dry and intact  Disposition: Home   Allergies as of 03/25/2017   No Known Allergies     Medication List    TAKE these medications   acetaminophen 500 MG tablet Commonly known as:  TYLENOL Take 1,000 mg by mouth 2 (two) times daily as needed (for pain.).   amLODipine 5 MG tablet Commonly known as:  NORVASC Take 5 mg by mouth daily.   benazepril 20 MG tablet Commonly known as:  LOTENSIN Take 20 mg by mouth daily.   cholecalciferol 1000 units tablet Commonly known as:  VITAMIN D Take 1,000 Units by mouth every 3 (three) months.   cyclobenzaprine 10 MG tablet Commonly known as:  FLEXERIL Take 1 tablet (10 mg total) by mouth 3 (three) times daily as needed for muscle spasms.    fluticasone 50 MCG/ACT nasal spray Commonly known as:  FLONASE Place 2 sprays into the nose daily as needed for allergies.   levothyroxine 75 MCG tablet Commonly known as:  SYNTHROID, LEVOTHROID Take 75 mcg by mouth daily before breakfast.   meloxicam 15 MG tablet Commonly known as:  MOBIC Take 1 tablet (15 mg total) by mouth daily.   metFORMIN 1000 MG tablet Commonly known as:  GLUCOPHAGE Take 1,000 mg by mouth 2 (two) times daily with a meal.   Oxycodone HCl 10 MG Tabs Take 1 tablet (10 mg total) by mouth every 3 (three) hours as needed for severe pain ((score 7 to 10)).   pantoprazole 40 MG tablet Commonly known as:  PROTONIX Take 40 mg by mouth daily.   SALONPAS PAIN RELIEF PATCH EX Place 1 patch onto the skin daily as needed (for pain.).   simvastatin 40 MG tablet Commonly known as:  ZOCOR Take 40 mg by mouth every evening.   sitaGLIPtin 100 MG tablet Commonly known as:  JANUVIA Take 100 mg by mouth daily.   tolterodine 2 MG tablet Commonly known as:  DETROL Take 2 mg by mouth 2 (two) times daily.        Signed: Starlet Gallentine P 03/25/2017, 7:08 AM

## 2017-05-13 ENCOUNTER — Ambulatory Visit (HOSPITAL_COMMUNITY)
Admission: RE | Admit: 2017-05-13 | Discharge: 2017-05-13 | Disposition: A | Discharge status: Home / Self Care | Payer: Medicare HMO | Source: Ambulatory Visit | Attending: Vascular Surgery | Admitting: Vascular Surgery

## 2017-05-13 ENCOUNTER — Other Ambulatory Visit (HOSPITAL_COMMUNITY): Payer: Self-pay | Admitting: Neurosurgery

## 2017-05-13 DIAGNOSIS — M7989 Other specified soft tissue disorders: Secondary | ICD-10-CM

## 2017-05-25 DIAGNOSIS — M1990 Unspecified osteoarthritis, unspecified site: Secondary | ICD-10-CM | POA: Diagnosis not present

## 2017-05-25 DIAGNOSIS — E559 Vitamin D deficiency, unspecified: Secondary | ICD-10-CM | POA: Diagnosis not present

## 2017-05-25 DIAGNOSIS — E7800 Pure hypercholesterolemia, unspecified: Secondary | ICD-10-CM | POA: Diagnosis not present

## 2017-05-25 DIAGNOSIS — M5116 Intervertebral disc disorders with radiculopathy, lumbar region: Secondary | ICD-10-CM | POA: Diagnosis not present

## 2017-05-25 DIAGNOSIS — N3281 Overactive bladder: Secondary | ICD-10-CM | POA: Diagnosis not present

## 2017-05-25 DIAGNOSIS — M5416 Radiculopathy, lumbar region: Secondary | ICD-10-CM | POA: Diagnosis not present

## 2017-05-25 DIAGNOSIS — R030 Elevated blood-pressure reading, without diagnosis of hypertension: Secondary | ICD-10-CM | POA: Diagnosis not present

## 2017-05-25 DIAGNOSIS — E039 Hypothyroidism, unspecified: Secondary | ICD-10-CM | POA: Diagnosis not present

## 2017-05-25 DIAGNOSIS — E1165 Type 2 diabetes mellitus with hyperglycemia: Secondary | ICD-10-CM | POA: Diagnosis not present

## 2017-05-25 DIAGNOSIS — E119 Type 2 diabetes mellitus without complications: Secondary | ICD-10-CM | POA: Diagnosis not present

## 2017-06-15 DIAGNOSIS — R69 Illness, unspecified: Secondary | ICD-10-CM | POA: Diagnosis not present

## 2017-07-06 DIAGNOSIS — M5126 Other intervertebral disc displacement, lumbar region: Secondary | ICD-10-CM | POA: Diagnosis not present

## 2017-07-06 DIAGNOSIS — M545 Low back pain: Secondary | ICD-10-CM | POA: Diagnosis not present

## 2017-07-06 DIAGNOSIS — M79605 Pain in left leg: Secondary | ICD-10-CM | POA: Diagnosis not present

## 2017-07-13 DIAGNOSIS — M79605 Pain in left leg: Secondary | ICD-10-CM | POA: Diagnosis not present

## 2017-07-13 DIAGNOSIS — M5126 Other intervertebral disc displacement, lumbar region: Secondary | ICD-10-CM | POA: Diagnosis not present

## 2017-07-13 DIAGNOSIS — M545 Low back pain: Secondary | ICD-10-CM | POA: Diagnosis not present

## 2017-07-15 DIAGNOSIS — M79605 Pain in left leg: Secondary | ICD-10-CM | POA: Diagnosis not present

## 2017-07-15 DIAGNOSIS — M5126 Other intervertebral disc displacement, lumbar region: Secondary | ICD-10-CM | POA: Diagnosis not present

## 2017-07-15 DIAGNOSIS — M545 Low back pain: Secondary | ICD-10-CM | POA: Diagnosis not present

## 2017-07-26 DIAGNOSIS — E119 Type 2 diabetes mellitus without complications: Secondary | ICD-10-CM | POA: Diagnosis not present

## 2017-07-29 ENCOUNTER — Other Ambulatory Visit: Payer: Self-pay | Admitting: Neurosurgery

## 2017-07-29 DIAGNOSIS — M5126 Other intervertebral disc displacement, lumbar region: Secondary | ICD-10-CM

## 2017-08-01 ENCOUNTER — Ambulatory Visit
Admission: RE | Admit: 2017-08-01 | Discharge: 2017-08-01 | Disposition: A | Discharge status: Home / Self Care | Payer: Medicare HMO | Source: Ambulatory Visit | Attending: Neurosurgery | Admitting: Neurosurgery

## 2017-08-01 DIAGNOSIS — M5126 Other intervertebral disc displacement, lumbar region: Secondary | ICD-10-CM | POA: Diagnosis not present

## 2017-08-01 MED ORDER — GADOBENATE DIMEGLUMINE 529 MG/ML IV SOLN
20.0000 mL | Freq: Once | INTRAVENOUS | Status: AC | PRN
  Administered 2017-08-01: 20 mL via INTRAVENOUS

## 2017-08-03 DIAGNOSIS — M5126 Other intervertebral disc displacement, lumbar region: Secondary | ICD-10-CM | POA: Diagnosis not present

## 2017-08-06 ENCOUNTER — Other Ambulatory Visit: Payer: Self-pay | Admitting: Neurosurgery

## 2017-08-23 NOTE — Pre-Procedure Instructions (Signed)
Jennifer Boyer  08/23/2017      [facility] # [address], [address] [place] [address] [place] [address] Phone: [phone] Fax: [phone]    Your procedure is scheduled on Wed. Aug. 14, 2019 from 8:30AM-10:09AM  Report to [facility] Admitting Entrance "A" at 6:30AM  Call this number if you have problems the morning of surgery:  [phone]   Remember:  Do not eat or drink after midnight on Aug. 13th    Take these medicines the morning of surgery with A SIP OF WATER: AmLODipine (NORVASC), Gabapentin (NEURONTIN), Levothyroxine (SYNTHROID, LEVOTHROID), Oxybutynin (DITROPAN), and Pantoprazole (PROTONIX)  If needed Cyclobenzaprine (FLEXERIL), Oxycodone (OXY-IR), and Fluticasone (FLONASE)  7 days before surgery (8/7), stop taking all Other Aspirin Products, Vitamins, Fish oils, and Herbal medications. Also stop all NSAIDS i.e. Advil, Ibuprofen, Motrin, Aleve, Anaprox, Naproxen, BC, Goody Powders, and all Supplements. Including: Meloxicam (MOBIC)  How to Manage Your Diabetes Before and After Surgery  Why is it important to control my blood sugar before and after surgery? . Improving blood sugar levels before and after surgery helps healing and can limit problems. . A way of improving blood sugar control is eating a healthy diet by: o  Eating less sugar and carbohydrates o  Increasing activity/exercise o  Talking with your doctor about reaching your blood sugar goals . High blood sugars (greater than 180 mg/dL) can raise your risk of infections and slow your recovery, so you will need to focus on controlling your diabetes during the weeks before surgery. . Make sure that the doctor who takes care of your diabetes knows about your planned surgery including the date and location.  How do I manage my blood sugar before surgery? . Check your blood sugar at least 4 times a day, starting 2 days before surgery, to make sure that the level  is not too high or low. o Check your blood sugar the morning of your surgery when you wake up and every 2 hours until you get to the Short Stay unit. . If your blood sugar is less than 70 mg/dL, you will need to treat for low blood sugar: o Do not take insulin. o Treat a low blood sugar (less than 70 mg/dL) with  cup of clear juice (cranberry or apple), 4 glucose tablets, OR glucose gel. Recheck blood sugar in 15 minutes after treatment (to make sure it is greater than 70 mg/dL). If your blood sugar is not greater than 70 mg/dL on recheck, call [phone] o  for further instructions. .  If your CBG is greater than 220 mg/dL, inform the staff upon arrival to Short Stay  . If you are admitted to the hospital after surgery: o Your blood sugar will be checked by the staff and you will probably be given insulin after surgery (instead of oral diabetes medicines) to make sure you have good blood sugar levels. o The goal for blood sugar control after surgery is 80-180 mg/dL.  WHAT DO I DO ABOUT MY DIABETES MEDICATION?  Marland Kitchen Do not take MetFORMIN (GLUCOPHAGE) and SitaGLIPtin (JANUVIA) the morning of surgery.  Reviewed and Endorsed by [facility] Patient Education Committee, August 2015    Do not wear jewelry, make-up or nail polish.  Do not wear lotions, powders, or perfumes, or deodorant.  Do not shave 48 hours prior to surgery.    Do not bring valuables to the hospital.  [facility] is not responsible  for any belongings or valuables.  Contacts, dentures or bridgework may not be worn into surgery.  Leave your suitcase in the car.  After surgery it may be brought to your room.  For patients admitted to the hospital, discharge time will be determined by your treatment team.  Patients discharged the day of surgery will not be allowed to drive home.   Special instructions:  [facility]- Preparing For Surgery  Before surgery, you can play an important role. Because skin is not sterile, your skin  needs to be as free of germs as possible. You can reduce the number of germs on your skin by washing with CHG (chlorahexidine gluconate) Soap before surgery.  CHG is an antiseptic cleaner which kills germs and bonds with the skin to continue killing germs even after washing.    Oral Hygiene is also important to reduce your risk of infection.  Remember - BRUSH YOUR TEETH THE MORNING OF SURGERY WITH YOUR REGULAR TOOTHPASTE  Please do not use if you have an allergy to CHG or antibacterial soaps. If your skin becomes reddened/irritated stop using the CHG.  Do not shave (including legs and underarms) for at least 48 hours prior to first CHG shower. It is OK to shave your face.  Please follow these instructions carefully.   1. Shower the NIGHT BEFORE SURGERY and the MORNING OF SURGERY with CHG.   2. If you chose to wash your hair, wash your hair first as usual with your normal shampoo.  3. After you shampoo, rinse your hair and body thoroughly to remove the shampoo.  4. Use CHG as you would any other liquid soap. You can apply CHG directly to the skin and wash gently with a scrungie or a clean washcloth.   5. Apply the CHG Soap to your body ONLY FROM THE NECK DOWN.  Do not use on open wounds or open sores. Avoid contact with your eyes, ears, mouth and genitals (private parts). Wash Face and genitals (private parts)  with your normal soap.  6. Wash thoroughly, paying special attention to the area where your surgery will be performed.  7. Thoroughly rinse your body with warm water from the neck down.  8. DO NOT shower/wash with your normal soap after using and rinsing off the CHG Soap.  9. Pat yourself dry with a CLEAN TOWEL.  10. Wear CLEAN PAJAMAS to bed the night before surgery, wear comfortable clothes the morning of surgery  11. Place CLEAN SHEETS on your bed the night of your first shower and DO NOT SLEEP WITH PETS.  Day of Surgery:  Do not apply any deodorants/lotions.  Please wear  clean clothes to the hospital/surgery center.   Remember to brush your teeth WITH YOUR REGULAR TOOTHPASTE.  Please read over the following fact sheets that you were given. Pain Booklet, Coughing and Deep Breathing, MRSA Information and Surgical Site Infection Prevention

## 2017-08-24 ENCOUNTER — Encounter (HOSPITAL_COMMUNITY): Payer: Self-pay

## 2017-08-24 ENCOUNTER — Encounter (HOSPITAL_COMMUNITY)
Admission: RE | Admit: 2017-08-24 | Discharge: 2017-08-24 | Disposition: A | Discharge status: Home / Self Care | Payer: Medicare HMO | Source: Ambulatory Visit | Attending: Neurosurgery | Admitting: Neurosurgery

## 2017-08-24 ENCOUNTER — Other Ambulatory Visit: Payer: Self-pay

## 2017-08-24 DIAGNOSIS — E119 Type 2 diabetes mellitus without complications: Secondary | ICD-10-CM | POA: Insufficient documentation

## 2017-08-24 DIAGNOSIS — Z01812 Encounter for preprocedural laboratory examination: Secondary | ICD-10-CM | POA: Insufficient documentation

## 2017-08-24 LAB — BASIC METABOLIC PANEL
Anion gap: 10 (ref 5–15)
BUN: 16 mg/dL (ref 8–23)
CALCIUM: 9.6 mg/dL (ref 8.9–10.3)
CO2: 26 mmol/L (ref 22–32)
CREATININE: 0.86 mg/dL (ref 0.44–1.00)
Chloride: 102 mmol/L (ref 98–111)
GFR calc non Af Amer: >60 mL/min (ref >60)
Glucose, Bld: 210 mg/dL — ABNORMAL HIGH (ref 70–99)
Potassium: 5.1 mmol/L (ref 3.5–5.1)
SODIUM: 138 mmol/L (ref 135–145)

## 2017-08-24 LAB — SURGICAL PCR SCREEN
MRSA, PCR: NEGATIVE
STAPHYLOCOCCUS AUREUS: NEGATIVE

## 2017-08-24 LAB — CBC
HCT: 40.3 % (ref 36.0–46.0)
Hemoglobin: 12.7 g/dL (ref 12.0–15.0)
MCH: 32.7 pg (ref 26.0–34.0)
MCHC: 31.5 g/dL (ref 30.0–36.0)
MCV: 103.9 fL — ABNORMAL HIGH (ref 78.0–100.0)
PLATELETS: 243 10*3/uL (ref 150–400)
RBC: 3.88 MIL/uL (ref 3.87–5.11)
RDW: 12.9 % (ref 11.5–15.5)
WBC: 6.1 10*3/uL (ref 4.0–10.5)

## 2017-08-24 LAB — GLUCOSE, CAPILLARY: Glucose-Capillary: 198 mg/dL — ABNORMAL HIGH (ref 70–99)

## 2017-08-24 LAB — HEMOGLOBIN A1C
HEMOGLOBIN A1C: 8 % — AB (ref 4.8–5.6)
Mean Plasma Glucose: 182.9 mg/dL

## 2017-08-24 NOTE — Progress Notes (Signed)
Anesthesia Chart Review:  Case:  [id number] Date/Time:  09/01/17 0815   Procedure:  Microdiscectomy - L5-S1 - left redo (Left Back)   Anesthesia type:  General   Pre-op diagnosis:  HNP   Location:  [facility] ROOM 21 / [facility]   Surgeon:  Kary Kos, MD      DISCUSSION: 77 yo female never smoker for above procedure. Pertinent medical hx includes HTN, DM, hypothyroidism, GERD, difficult intubation, fibromyalgia.  In her history she is listed as a difficult intubation, per pt report many years ago. She has had multiple surgeries since that time without issue.  Pt had a laparoscopy in 2009; Glidescope used to place 7.5 ETT on 1 attempt. LMA used for D&C in 2014.  Most recently pt had L4-5, L5-S1 laminectomy/ foraminotomy on 03/24/2017 and Anesthesia Procedure notes indicate that intubation was accomplished with ETT inserted through vocal cords under direct vision on 1 attempt.  She has reported history of difficult intubation but recent surgical records do not report difficulty. Anticipate she can proceed with surgery as scheduled barring acute status change.  VS: BP (!) 147/62   Pulse 70   Temp 36.8 C   Resp 20   Ht 5' 6.5" (1.689 m)   Wt 230 lb (104.3 kg)   SpO2 98%   BMI 36.57 kg/m    PROVIDERS: Vernie Shanks, MD is PCP   LABS: Labs reviewed: Acceptable for surgery. Elevated A1c, similar to last value of 7.9 on 03/22/2017. (all labs ordered are listed, but only abnormal results are displayed)  Labs Reviewed  GLUCOSE, CAPILLARY - Abnormal; Notable for the following components:      Result Value   Glucose-Capillary 198 (*)    All other components within normal limits  HEMOGLOBIN A1C - Abnormal; Notable for the following components:   Hgb A1c MFr Bld 8.0 (*)    All other components within normal limits  BASIC METABOLIC PANEL - Abnormal; Notable for the following components:   Glucose, Bld 210 (*)    All other components within normal limits  CBC - Abnormal; Notable for the following  components:   MCV 103.9 (*)    All other components within normal limits  SURGICAL PCR SCREEN     IMAGES: N/A   EKG: 03/22/2017: NSR. Low voltage QRS.   CV: N/A  Past Medical History:  Diagnosis Date  . Arthritis    OA  . Complication of anesthesia    pt has been told she is hard to intubate.  . Diabetes mellitus without complication ([place])   . Difficult intubation   . Fibromyalgia   . GERD (gastroesophageal reflux disease)   . Headache    HX MIGRAINES  NONE IN LONG TIME  . History of hiatal hernia   . Hypertension   . Hypothyroidism     Past Surgical History:  Procedure Laterality Date  . BREAST SURGERY     bilateral mastectomy  . CHOLECYSTECTOMY    . DILATATION & CURETTAGE/HYSTEROSCOPY WITH TRUECLEAR N/A 09/30/2012   Procedure: DILATATION & CURETTAGE/HYSTEROSCOPY WITH TRUECLEAR;  Surgeon: Shon Millet II, MD;  Location: [facility];  Service: Gynecology;  Laterality: N/A;  . DILATION AND CURETTAGE OF UTERUS     uterine polyp  . KNEE SURGERY    . LUMBAR LAMINECTOMY/DECOMPRESSION MICRODISCECTOMY Left 03/24/2017   Procedure: LEFT LUMBAR FIVE-SACRAL ONE MICRODISCECTOMY, LEFT LUMBAR FOUR-FIVE DECOMPRESSIVE LAMINECTOMY;  Surgeon: Kary Kos, MD;  Location: [facility];  Service: Neurosurgery;  Laterality: Left;  Left L4-5 L5-S1  Laminectomy/Foraminotomy  . TONSILLECTOMY      MEDICATIONS: . acetaminophen (TYLENOL) 500 MG tablet  . amLODipine (NORVASC) 5 MG tablet  . benazepril (LOTENSIN) 20 MG tablet  . cholecalciferol (VITAMIN D) 1000 units tablet  . cyclobenzaprine (FLEXERIL) 10 MG tablet  . cyclobenzaprine (FLEXERIL) 5 MG tablet  . fluticasone (FLONASE) 50 MCG/ACT nasal spray  . gabapentin (NEURONTIN) 300 MG capsule  . levothyroxine (SYNTHROID, LEVOTHROID) 75 MCG tablet  . meloxicam (MOBIC) 15 MG tablet  . metFORMIN (GLUCOPHAGE) 1000 MG tablet  . OVER THE COUNTER MEDICATION  . oxybutynin (DITROPAN) 5 MG tablet  . oxycodone (OXY-IR) 5 MG capsule  . oxyCODONE 10 MG TABS   . pantoprazole (PROTONIX) 40 MG tablet  . simvastatin (ZOCOR) 40 MG tablet  . sitaGLIPtin (JANUVIA) 100 MG tablet   No current facility-administered medications for this encounter.      Wynonia Musty [facility] Short Stay Center/Anesthesiology Phone [phone] 08/24/2017 2:00 PM

## 2017-08-24 NOTE — Progress Notes (Signed)
Pt with hx of difficult airway 10-11 years ago. Pt reports no issues with intubation with last surgery 5 months ago. Called [place] PA-C with anesthesia, no need to see pt during PAT appt.

## 2017-08-31 NOTE — Anesthesia Preprocedure Evaluation (Addendum)
Anesthesia Evaluation  Patient identified by MRN, date of birth, ID band Patient awake    Reviewed: Allergy & Precautions, H&P , NPO status , Patient's Chart, lab work & pertinent test results  History of Anesthesia Complications (+) DIFFICULT AIRWAY  Airway Mallampati: III  TM Distance: >3 FB Neck ROM: Full    Dental no notable dental hx. (+) Teeth Intact, Dental Advisory Given   Pulmonary neg pulmonary ROS,    Pulmonary exam normal breath sounds clear to auscultation       Cardiovascular Exercise Tolerance: Good hypertension, Pt. on medications  Rhythm:Regular Rate:Normal     Neuro/Psych  Headaches, negative psych ROS   GI/Hepatic Neg liver ROS, hiatal hernia, GERD  ,  Endo/Other  diabetes, Type 2, Oral Hypoglycemic AgentsHypothyroidism Morbid obesity  Renal/GU negative Renal ROS  negative genitourinary   Musculoskeletal  (+) Arthritis , Osteoarthritis,  Fibromyalgia -  Abdominal   Peds  Hematology negative hematology ROS (+)   Anesthesia Other Findings   Reproductive/Obstetrics negative OB ROS                            Anesthesia Physical Anesthesia Plan  ASA: III  Anesthesia Plan: General   Post-op Pain Management:    Induction: Intravenous  PONV Risk Score and Plan: 4 or greater and Ondansetron, Dexamethasone and Midazolam  Airway Management Planned: Oral ETT and Video Laryngoscope Planned  Additional Equipment:   Intra-op Plan:   Post-operative Plan: Extubation in OR  Informed Consent: I have reviewed the patients History and Physical, chart, labs and discussed the procedure including the risks, benefits and alternatives for the proposed anesthesia with the patient or authorized representative who has indicated his/her understanding and acceptance.   Dental advisory given  Plan Discussed with: CRNA  Anesthesia Plan Comments:         Anesthesia Quick  Evaluation

## 2017-09-01 ENCOUNTER — Encounter (HOSPITAL_COMMUNITY)
Admission: RE | Disposition: A | Discharge status: Home / Self Care | Payer: Self-pay | Source: Ambulatory Visit | Attending: Neurosurgery

## 2017-09-01 ENCOUNTER — Encounter (HOSPITAL_COMMUNITY): Payer: Self-pay | Admitting: Certified Registered Nurse Anesthetist

## 2017-09-01 ENCOUNTER — Other Ambulatory Visit: Payer: Self-pay

## 2017-09-01 ENCOUNTER — Ambulatory Visit (HOSPITAL_COMMUNITY): Payer: Medicare HMO | Admitting: Physician Assistant

## 2017-09-01 ENCOUNTER — Ambulatory Visit (HOSPITAL_COMMUNITY): Payer: Medicare HMO

## 2017-09-01 ENCOUNTER — Ambulatory Visit (HOSPITAL_COMMUNITY): Payer: Medicare HMO | Admitting: Anesthesiology

## 2017-09-01 ENCOUNTER — Observation Stay (HOSPITAL_COMMUNITY)
Admission: RE | Admit: 2017-09-01 | Discharge: 2017-09-02 | Disposition: A | Discharge status: Home / Self Care | Payer: Medicare HMO | Source: Ambulatory Visit | Attending: Neurosurgery | Admitting: Neurosurgery

## 2017-09-01 DIAGNOSIS — K449 Diaphragmatic hernia without obstruction or gangrene: Secondary | ICD-10-CM | POA: Insufficient documentation

## 2017-09-01 DIAGNOSIS — M797 Fibromyalgia: Secondary | ICD-10-CM | POA: Diagnosis not present

## 2017-09-01 DIAGNOSIS — K219 Gastro-esophageal reflux disease without esophagitis: Secondary | ICD-10-CM | POA: Insufficient documentation

## 2017-09-01 DIAGNOSIS — M5117 Intervertebral disc disorders with radiculopathy, lumbosacral region: Principal | ICD-10-CM | POA: Insufficient documentation

## 2017-09-01 DIAGNOSIS — E039 Hypothyroidism, unspecified: Secondary | ICD-10-CM | POA: Insufficient documentation

## 2017-09-01 DIAGNOSIS — M5126 Other intervertebral disc displacement, lumbar region: Secondary | ICD-10-CM | POA: Diagnosis present

## 2017-09-01 DIAGNOSIS — Z79899 Other long term (current) drug therapy: Secondary | ICD-10-CM | POA: Diagnosis not present

## 2017-09-01 DIAGNOSIS — Z9889 Other specified postprocedural states: Secondary | ICD-10-CM | POA: Diagnosis not present

## 2017-09-01 DIAGNOSIS — I10 Essential (primary) hypertension: Secondary | ICD-10-CM | POA: Diagnosis not present

## 2017-09-01 DIAGNOSIS — G43909 Migraine, unspecified, not intractable, without status migrainosus: Secondary | ICD-10-CM | POA: Diagnosis not present

## 2017-09-01 DIAGNOSIS — Z7984 Long term (current) use of oral hypoglycemic drugs: Secondary | ICD-10-CM | POA: Insufficient documentation

## 2017-09-01 DIAGNOSIS — E119 Type 2 diabetes mellitus without complications: Secondary | ICD-10-CM | POA: Insufficient documentation

## 2017-09-01 DIAGNOSIS — Z9049 Acquired absence of other specified parts of digestive tract: Secondary | ICD-10-CM | POA: Insufficient documentation

## 2017-09-01 DIAGNOSIS — Z981 Arthrodesis status: Secondary | ICD-10-CM | POA: Diagnosis not present

## 2017-09-01 DIAGNOSIS — M1990 Unspecified osteoarthritis, unspecified site: Secondary | ICD-10-CM | POA: Insufficient documentation

## 2017-09-01 DIAGNOSIS — Z419 Encounter for procedure for purposes other than remedying health state, unspecified: Secondary | ICD-10-CM

## 2017-09-01 DIAGNOSIS — M5127 Other intervertebral disc displacement, lumbosacral region: Secondary | ICD-10-CM | POA: Diagnosis not present

## 2017-09-01 HISTORY — PX: LUMBAR LAMINECTOMY/DECOMPRESSION MICRODISCECTOMY: SHX5026

## 2017-09-01 LAB — GLUCOSE, CAPILLARY
GLUCOSE-CAPILLARY: 345 mg/dL — AB (ref 70–99)
Glucose-Capillary: 166 mg/dL — ABNORMAL HIGH (ref 70–99)
Glucose-Capillary: 199 mg/dL — ABNORMAL HIGH (ref 70–99)
Glucose-Capillary: 277 mg/dL — ABNORMAL HIGH (ref 70–99)

## 2017-09-01 SURGERY — LUMBAR LAMINECTOMY/DECOMPRESSION MICRODISCECTOMY 1 LEVEL
Anesthesia: General | Site: Spine Lumbar | Laterality: Left

## 2017-09-01 MED ORDER — EPHEDRINE 5 MG/ML INJ
INTRAVENOUS | Status: AC
  Filled 2017-09-01: qty 10

## 2017-09-01 MED ORDER — AMLODIPINE BESYLATE 5 MG PO TABS: 5.0000 mg | ORAL_TABLET | Freq: Every day | ORAL | Status: DC

## 2017-09-01 MED ORDER — CYCLOBENZAPRINE HCL 10 MG PO TABS
10.0000 mg | ORAL_TABLET | Freq: Three times a day (TID) | ORAL | Status: DC | PRN
  Administered 2017-09-01: 10 mg via ORAL
  Filled 2017-09-01: qty 1

## 2017-09-01 MED ORDER — MENTHOL 3 MG MT LOZG: 1.0000 | LOZENGE | OROMUCOSAL | Status: DC | PRN

## 2017-09-01 MED ORDER — CHLORHEXIDINE GLUCONATE CLOTH 2 % EX PADS: 6.0000 | MEDICATED_PAD | Freq: Once | CUTANEOUS | Status: DC

## 2017-09-01 MED ORDER — ALUM & MAG HYDROXIDE-SIMETH 200-200-20 MG/5ML PO SUSP: 30.0000 mL | Freq: Four times a day (QID) | ORAL | Status: DC | PRN

## 2017-09-01 MED ORDER — HEMOSTATIC AGENTS (NO CHARGE) OPTIME
TOPICAL | Status: DC | PRN
  Administered 2017-09-01: 1 via TOPICAL

## 2017-09-01 MED ORDER — MELOXICAM 7.5 MG PO TABS
15.0000 mg | ORAL_TABLET | Freq: Every day | ORAL | Status: DC
  Administered 2017-09-01: 15 mg via ORAL
  Filled 2017-09-01 (×2): qty 2

## 2017-09-01 MED ORDER — ONDANSETRON HCL 4 MG/2ML IJ SOLN
INTRAMUSCULAR | Status: AC
  Filled 2017-09-01: qty 2

## 2017-09-01 MED ORDER — PHENYLEPHRINE 40 MCG/ML (10ML) SYRINGE FOR IV PUSH (FOR BLOOD PRESSURE SUPPORT)
PREFILLED_SYRINGE | INTRAVENOUS | Status: AC
  Filled 2017-09-01: qty 20

## 2017-09-01 MED ORDER — SODIUM CHLORIDE 0.9% FLUSH: 3.0000 mL | INTRAVENOUS | Status: DC | PRN

## 2017-09-01 MED ORDER — OXYCODONE HCL 5 MG PO TABS
10.0000 mg | ORAL_TABLET | ORAL | Status: DC | PRN
  Administered 2017-09-01 – 2017-09-02 (×3): 10 mg via ORAL
  Filled 2017-09-01 (×3): qty 2

## 2017-09-01 MED ORDER — SODIUM CHLORIDE 0.9 % IV SOLN
INTRAVENOUS | Status: DC | PRN
  Administered 2017-09-01: 500 mL

## 2017-09-01 MED ORDER — SUCCINYLCHOLINE CHLORIDE 200 MG/10ML IV SOSY
PREFILLED_SYRINGE | INTRAVENOUS | Status: AC
  Filled 2017-09-01: qty 10

## 2017-09-01 MED ORDER — OXYCODONE HCL 5 MG PO TABS: 10.0000 mg | ORAL_TABLET | ORAL | Status: DC | PRN

## 2017-09-01 MED ORDER — SUGAMMADEX SODIUM 200 MG/2ML IV SOLN
INTRAVENOUS | Status: DC | PRN
  Administered 2017-09-01: 208.6 mg via INTRAVENOUS

## 2017-09-01 MED ORDER — SIMVASTATIN 40 MG PO TABS
40.0000 mg | ORAL_TABLET | Freq: Every evening | ORAL | Status: DC
  Administered 2017-09-01 (×2): 40 mg via ORAL
  Filled 2017-09-01 (×2): qty 2
  Filled 2017-09-01: qty 1

## 2017-09-01 MED ORDER — PANTOPRAZOLE SODIUM 40 MG PO TBEC: 40.0000 mg | DELAYED_RELEASE_TABLET | Freq: Every day | ORAL | Status: DC

## 2017-09-01 MED ORDER — ACETAMINOPHEN 500 MG PO TABS: 500.0000 mg | ORAL_TABLET | Freq: Four times a day (QID) | ORAL | Status: DC | PRN

## 2017-09-01 MED ORDER — HYDROMORPHONE HCL 1 MG/ML IJ SOLN
0.2500 mg | INTRAMUSCULAR | Status: DC | PRN
  Administered 2017-09-01 (×2): 0.5 mg via INTRAVENOUS

## 2017-09-01 MED ORDER — PHENYLEPHRINE 40 MCG/ML (10ML) SYRINGE FOR IV PUSH (FOR BLOOD PRESSURE SUPPORT)
PREFILLED_SYRINGE | INTRAVENOUS | Status: AC
  Filled 2017-09-01: qty 10

## 2017-09-01 MED ORDER — BUPIVACAINE HCL (PF) 0.25 % IJ SOLN
INTRAMUSCULAR | Status: AC
  Filled 2017-09-01: qty 30

## 2017-09-01 MED ORDER — ACETAMINOPHEN 325 MG PO TABS
650.0000 mg | ORAL_TABLET | ORAL | Status: DC | PRN
Start: 2017-09-01 — End: 2017-09-01

## 2017-09-01 MED ORDER — FLUTICASONE PROPIONATE 50 MCG/ACT NA SUSP: 2.0000 | Freq: Every day | NASAL | Status: DC | PRN

## 2017-09-01 MED ORDER — CYCLOBENZAPRINE HCL 10 MG PO TABS
ORAL_TABLET | ORAL | Status: AC
  Filled 2017-09-01: qty 1

## 2017-09-01 MED ORDER — ROCURONIUM BROMIDE 10 MG/ML (PF) SYRINGE
PREFILLED_SYRINGE | INTRAVENOUS | Status: DC | PRN
  Administered 2017-09-01: 10 mg via INTRAVENOUS
  Administered 2017-09-01: 50 mg via INTRAVENOUS
  Administered 2017-09-01: 10 mg via INTRAVENOUS
  Administered 2017-09-01: 5 mg via INTRAVENOUS

## 2017-09-01 MED ORDER — PHENOL 1.4 % MT LIQD: 1.0000 | OROMUCOSAL | Status: DC | PRN

## 2017-09-01 MED ORDER — ACETAMINOPHEN 650 MG RE SUPP: 650.0000 mg | RECTAL | Status: DC | PRN

## 2017-09-01 MED ORDER — CEFAZOLIN SODIUM-DEXTROSE 2-4 GM/100ML-% IV SOLN
2.0000 g | INTRAVENOUS | Status: AC
  Administered 2017-09-01: 2 g via INTRAVENOUS
  Filled 2017-09-01: qty 100

## 2017-09-01 MED ORDER — OXYCODONE HCL 5 MG PO TABS
ORAL_TABLET | ORAL | Status: AC
  Administered 2017-09-01: 10 mg
  Filled 2017-09-01: qty 2

## 2017-09-01 MED ORDER — PROPOFOL 10 MG/ML IV BOLUS
INTRAVENOUS | Status: DC | PRN
  Administered 2017-09-01: 150 mg via INTRAVENOUS

## 2017-09-01 MED ORDER — FENTANYL CITRATE (PF) 250 MCG/5ML IJ SOLN
INTRAMUSCULAR | Status: DC | PRN
  Administered 2017-09-01 (×4): 50 ug via INTRAVENOUS

## 2017-09-01 MED ORDER — LACTATED RINGERS IV SOLN
INTRAVENOUS | Status: DC | PRN
  Administered 2017-09-01: 08:00:00 via INTRAVENOUS

## 2017-09-01 MED ORDER — THROMBIN 5000 UNITS EX SOLR
CUTANEOUS | Status: AC
  Filled 2017-09-01: qty 10000

## 2017-09-01 MED ORDER — ONDANSETRON HCL 4 MG/2ML IJ SOLN: 4.0000 mg | Freq: Four times a day (QID) | INTRAMUSCULAR | Status: DC | PRN

## 2017-09-01 MED ORDER — ONDANSETRON HCL 4 MG PO TABS: 4.0000 mg | ORAL_TABLET | Freq: Four times a day (QID) | ORAL | Status: DC | PRN

## 2017-09-01 MED ORDER — BENAZEPRIL HCL 20 MG PO TABS
20.0000 mg | ORAL_TABLET | Freq: Every day | ORAL | Status: DC
  Administered 2017-09-01: 20 mg via ORAL
  Filled 2017-09-01 (×2): qty 1

## 2017-09-01 MED ORDER — BUPIVACAINE HCL (PF) 0.5 % IJ SOLN
INTRAMUSCULAR | Status: AC
  Filled 2017-09-01: qty 30

## 2017-09-01 MED ORDER — VITAMIN D3 25 MCG (1000 UNIT) PO TABS
1000.0000 [IU] | ORAL_TABLET | ORAL | Status: DC
  Administered 2017-09-01: 1000 [IU] via ORAL
  Filled 2017-09-01: qty 1

## 2017-09-01 MED ORDER — SODIUM CHLORIDE 0.9 % IV SOLN
250.0000 mL | INTRAVENOUS | Status: DC
  Administered 2017-09-01: 250 mL via INTRAVENOUS

## 2017-09-01 MED ORDER — DEXAMETHASONE SODIUM PHOSPHATE 10 MG/ML IJ SOLN
10.0000 mg | INTRAMUSCULAR | Status: DC
Start: 2017-09-01 — End: 2017-09-01
  Filled 2017-09-01: qty 1

## 2017-09-01 MED ORDER — OXYBUTYNIN CHLORIDE 5 MG PO TABS
5.0000 mg | ORAL_TABLET | Freq: Two times a day (BID) | ORAL | Status: DC
  Administered 2017-09-01: 5 mg via ORAL
  Filled 2017-09-01 (×2): qty 1

## 2017-09-01 MED ORDER — HYDROMORPHONE HCL 1 MG/ML IJ SOLN: 0.5000 mg | INTRAMUSCULAR | Status: DC | PRN

## 2017-09-01 MED ORDER — 0.9 % SODIUM CHLORIDE (POUR BTL) OPTIME
TOPICAL | Status: DC | PRN
  Administered 2017-09-01: 1000 mL

## 2017-09-01 MED ORDER — HYDROMORPHONE HCL 1 MG/ML IJ SOLN
INTRAMUSCULAR | Status: AC
  Filled 2017-09-01: qty 1

## 2017-09-01 MED ORDER — CYCLOBENZAPRINE HCL 10 MG PO TABS
10.0000 mg | ORAL_TABLET | Freq: Three times a day (TID) | ORAL | Status: DC | PRN
  Administered 2017-09-01: 10 mg via ORAL

## 2017-09-01 MED ORDER — GABAPENTIN 300 MG PO CAPS
300.0000 mg | ORAL_CAPSULE | Freq: Three times a day (TID) | ORAL | Status: DC
  Administered 2017-09-01 (×2): 300 mg via ORAL
  Filled 2017-09-01 (×2): qty 1

## 2017-09-01 MED ORDER — EPHEDRINE SULFATE-NACL 50-0.9 MG/10ML-% IV SOSY
PREFILLED_SYRINGE | INTRAVENOUS | Status: DC | PRN
Start: 2017-09-01 — End: 2017-09-01
  Administered 2017-09-01 (×2): 5 mg via INTRAVENOUS

## 2017-09-01 MED ORDER — FENTANYL CITRATE (PF) 250 MCG/5ML IJ SOLN
INTRAMUSCULAR | Status: AC
  Filled 2017-09-01: qty 5

## 2017-09-01 MED ORDER — LIDOCAINE-EPINEPHRINE 1 %-1:100000 IJ SOLN
INTRAMUSCULAR | Status: AC
  Filled 2017-09-01: qty 1

## 2017-09-01 MED ORDER — DEXAMETHASONE SODIUM PHOSPHATE 10 MG/ML IJ SOLN
INTRAMUSCULAR | Status: AC
  Filled 2017-09-01: qty 1

## 2017-09-01 MED ORDER — LEVOTHYROXINE SODIUM 75 MCG PO TABS
75.0000 ug | ORAL_TABLET | Freq: Every day | ORAL | Status: DC
  Administered 2017-09-02: 75 ug via ORAL
  Filled 2017-09-01: qty 1

## 2017-09-01 MED ORDER — THROMBIN 5000 UNITS EX SOLR
CUTANEOUS | Status: DC | PRN
  Administered 2017-09-01 (×2): 5000 [IU] via TOPICAL

## 2017-09-01 MED ORDER — SODIUM CHLORIDE 0.9% FLUSH
3.0000 mL | Freq: Two times a day (BID) | INTRAVENOUS | Status: DC
  Administered 2017-09-01: 3 mL via INTRAVENOUS

## 2017-09-01 MED ORDER — ONDANSETRON HCL 4 MG/2ML IJ SOLN
INTRAMUSCULAR | Status: DC | PRN
  Administered 2017-09-01: 4 mg via INTRAVENOUS

## 2017-09-01 MED ORDER — METFORMIN HCL 500 MG PO TABS
1000.0000 mg | ORAL_TABLET | Freq: Two times a day (BID) | ORAL | Status: DC
Start: 2017-09-01 — End: 2017-09-02
  Administered 2017-09-01 – 2017-09-02 (×2): 1000 mg via ORAL
  Filled 2017-09-01 (×2): qty 2

## 2017-09-01 MED ORDER — SODIUM CHLORIDE 0.9 % IV SOLN
INTRAVENOUS | Status: DC | PRN
  Administered 2017-09-01: 15 ug/min via INTRAVENOUS

## 2017-09-01 MED ORDER — LIDOCAINE-EPINEPHRINE 1 %-1:100000 IJ SOLN
INTRAMUSCULAR | Status: DC | PRN
  Administered 2017-09-01: 10 mL

## 2017-09-01 MED ORDER — DEXAMETHASONE SODIUM PHOSPHATE 10 MG/ML IJ SOLN
INTRAMUSCULAR | Status: DC | PRN
  Administered 2017-09-01: 4 mg via INTRAVENOUS

## 2017-09-01 MED ORDER — PHENYLEPHRINE 40 MCG/ML (10ML) SYRINGE FOR IV PUSH (FOR BLOOD PRESSURE SUPPORT)
PREFILLED_SYRINGE | INTRAVENOUS | Status: DC | PRN
  Administered 2017-09-01 (×3): 80 ug via INTRAVENOUS
  Administered 2017-09-01: 40 ug via INTRAVENOUS

## 2017-09-01 MED ORDER — CEFAZOLIN SODIUM-DEXTROSE 2-4 GM/100ML-% IV SOLN
2.0000 g | Freq: Three times a day (TID) | INTRAVENOUS | Status: AC
  Administered 2017-09-01 – 2017-09-02 (×2): 2 g via INTRAVENOUS
  Filled 2017-09-01: qty 100

## 2017-09-01 MED ORDER — BUPIVACAINE HCL (PF) 0.25 % IJ SOLN
INTRAMUSCULAR | Status: DC | PRN
  Administered 2017-09-01: 20 mL

## 2017-09-01 MED ORDER — LIDOCAINE 2% (20 MG/ML) 5 ML SYRINGE
INTRAMUSCULAR | Status: DC | PRN
  Administered 2017-09-01: 60 mg via INTRAVENOUS

## 2017-09-01 SURGICAL SUPPLY — 57 items
BAG DECANTER FOR FLEXI CONT (MISCELLANEOUS) ×2 IMPLANT
BENZOIN TINCTURE PRP APPL 2/3 (GAUZE/BANDAGES/DRESSINGS) ×2 IMPLANT
BLADE CLIPPER SURG (BLADE) IMPLANT
BLADE SURG 11 STRL SS (BLADE) ×2 IMPLANT
BUR CUTTER 7.0 ROUND (BURR) ×2 IMPLANT
BUR MATCHSTICK NEURO 3.0 LAGG (BURR) ×2 IMPLANT
CANISTER SUCT 3000ML PPV (MISCELLANEOUS) ×2 IMPLANT
CARTRIDGE OIL MAESTRO DRILL (MISCELLANEOUS) ×1 IMPLANT
DECANTER SPIKE VIAL GLASS SM (MISCELLANEOUS) ×2 IMPLANT
DERMABOND ADVANCED (GAUZE/BANDAGES/DRESSINGS) ×1
DERMABOND ADVANCED .7 DNX12 (GAUZE/BANDAGES/DRESSINGS) ×1 IMPLANT
DIFFUSER DRILL AIR PNEUMATIC (MISCELLANEOUS) ×2 IMPLANT
DRAPE HALF SHEET 40X57 (DRAPES) ×2 IMPLANT
DRAPE LAPAROTOMY 100X72X124 (DRAPES) ×2 IMPLANT
DRAPE MICROSCOPE LEICA (MISCELLANEOUS) ×2 IMPLANT
DRAPE SURG 17X23 STRL (DRAPES) ×2 IMPLANT
DRSG OPSITE POSTOP 4X6 (GAUZE/BANDAGES/DRESSINGS) ×2 IMPLANT
DURAPREP 26ML APPLICATOR (WOUND CARE) ×2 IMPLANT
ELECT REM PT RETURN 9FT ADLT (ELECTROSURGICAL) ×2
ELECTRODE REM PT RTRN 9FT ADLT (ELECTROSURGICAL) ×1 IMPLANT
GAUZE 4X4 16PLY RFD (DISPOSABLE) IMPLANT
GAUZE SPONGE 4X4 12PLY STRL (GAUZE/BANDAGES/DRESSINGS) IMPLANT
GLOVE BIO SURGEON STRL SZ7 (GLOVE) ×2 IMPLANT
GLOVE BIO SURGEON STRL SZ8 (GLOVE) ×2 IMPLANT
GLOVE BIOGEL PI IND STRL 6.5 (GLOVE) ×1 IMPLANT
GLOVE BIOGEL PI IND STRL 7.0 (GLOVE) ×1 IMPLANT
GLOVE BIOGEL PI IND STRL 7.5 (GLOVE) ×1 IMPLANT
GLOVE BIOGEL PI INDICATOR 6.5 (GLOVE) ×1
GLOVE BIOGEL PI INDICATOR 7.0 (GLOVE) ×1
GLOVE BIOGEL PI INDICATOR 7.5 (GLOVE) ×1
GLOVE EXAM NITRILE LRG STRL (GLOVE) IMPLANT
GLOVE EXAM NITRILE XL STR (GLOVE) IMPLANT
GLOVE EXAM NITRILE XS STR PU (GLOVE) IMPLANT
GLOVE INDICATOR 8.5 STRL (GLOVE) ×2 IMPLANT
GLOVE SURG SS PI 7.0 STRL IVOR (GLOVE) ×6 IMPLANT
GOWN STRL REUS W/ TWL LRG LVL3 (GOWN DISPOSABLE) ×2 IMPLANT
GOWN STRL REUS W/ TWL XL LVL3 (GOWN DISPOSABLE) ×1 IMPLANT
GOWN STRL REUS W/TWL 2XL LVL3 (GOWN DISPOSABLE) IMPLANT
GOWN STRL REUS W/TWL LRG LVL3 (GOWN DISPOSABLE) ×2
GOWN STRL REUS W/TWL XL LVL3 (GOWN DISPOSABLE) ×2
KIT BASIN OR (CUSTOM PROCEDURE TRAY) ×2 IMPLANT
KIT TURNOVER KIT B (KITS) ×2 IMPLANT
NEEDLE HYPO 22GX1.5 SAFETY (NEEDLE) ×2 IMPLANT
NEEDLE SPNL 22GX3.5 QUINCKE BK (NEEDLE) ×2 IMPLANT
NS IRRIG 1000ML POUR BTL (IV SOLUTION) ×2 IMPLANT
OIL CARTRIDGE MAESTRO DRILL (MISCELLANEOUS) ×2
PACK LAMINECTOMY NEURO (CUSTOM PROCEDURE TRAY) ×2 IMPLANT
RUBBERBAND STERILE (MISCELLANEOUS) ×4 IMPLANT
SPONGE SURGIFOAM ABS GEL SZ50 (HEMOSTASIS) ×2 IMPLANT
STRIP CLOSURE SKIN 1/2X4 (GAUZE/BANDAGES/DRESSINGS) ×2 IMPLANT
SUT VIC AB 0 CT1 18XCR BRD8 (SUTURE) ×1 IMPLANT
SUT VIC AB 0 CT1 8-18 (SUTURE) ×1
SUT VIC AB 2-0 CT1 18 (SUTURE) ×4 IMPLANT
SUT VICRYL 4-0 PS2 18IN ABS (SUTURE) ×2 IMPLANT
TOWEL GREEN STERILE (TOWEL DISPOSABLE) ×2 IMPLANT
TOWEL GREEN STERILE FF (TOWEL DISPOSABLE) ×2 IMPLANT
WATER STERILE IRR 1000ML POUR (IV SOLUTION) ×2 IMPLANT

## 2017-09-01 NOTE — Anesthesia Procedure Notes (Signed)
Procedure Name: Intubation Date/Time: 09/01/2017 7:39 AM Performed by: Colin Benton, CRNA Pre-anesthesia Checklist: Patient identified, Emergency Drugs available, Suction available and Patient being monitored Patient Re-evaluated:Patient Re-evaluated prior to induction Oxygen Delivery Method: Circle system utilized Preoxygenation: Pre-oxygenation with 100% oxygen Induction Type: IV induction Ventilation: Mask ventilation without difficulty and Oral airway inserted - appropriate to patient size Laryngoscope Size: Sabra Heck and 2 Grade View: Grade II Tube type: Oral Tube size: 7.0 mm Number of attempts: 1 Airway Equipment and Method: Stylet Placement Confirmation: ETT inserted through vocal cords under direct vision,  positive ETCO2 and breath sounds checked- equal and bilateral Secured at: 21 cm Tube secured with: Tape Dental Injury: Teeth and Oropharynx as per pre-operative assessment

## 2017-09-01 NOTE — Progress Notes (Signed)
Inpatient Diabetes Program Recommendations  AACE/ADA: New Consensus Statement on Inpatient Glycemic Control (2015)  Target Ranges:  Prepandial:   less than 140 mg/dL      Peak postprandial:   less than 180 mg/dL (1-2 hours)      Critically ill patients:  140 - 180 mg/dL   Lab Results  Component Value Date   GLUCAP 199 (H) 09/01/2017   HGBA1C 8.0 (H) 08/24/2017    Review of Glycemic ControlResults for MARJIE, CHEA (MRN [id number]) as of 09/01/2017 14:46  Ref. Range 09/01/2017 07:01 09/01/2017 10:53  Glucose-Capillary Latest Ref Range: 70 - 99 mg/dL 166 (H) 199 (H)    Diabetes history: Type 2 DM  Outpatient Diabetes medications: Metformin 1000 mg bid, Januvia 100 mg daily Current orders for Inpatient glycemic control:  Metformin 1000 mg bid with meals  Inpatient Diabetes Program Recommendations:    While in the hospital consider adding Novolog moderate correction tid with meals and HS.  Thanks,  Adah Perl, RN, BC-ADM Inpatient Diabetes Coordinator Pager [phone] (8a-5p)

## 2017-09-01 NOTE — Op Note (Signed)
Preoperative diagnosis: Recurrent herniated nucleus pulposus L5-S1 left  Postop diagnosis: Same  Procedure: Redo lumbar limiting microscopic discectomy L5-S1 left with microdissection of left S1 nerve root microscopic discectomy  Surgeon: Dominica Severin Ellieanna Funderburg  Asst.: Glenford Peers  Anesthesia: Gen.  EBL: Minimal  History of present illness: 77 year old female who underwent lumbar discectomy several months ago developed recurrent left leg pain workup revealed recurrent disc herniation. Due to patient's failure conservative treatment imaging findings and progression of clinical syndrome or recommended redo discectomy at L5-S1 left. I extensively went over the risks and benefits of the operation with the patient as well as perioperative course expectations of outcome alternatives of surgery and she understood and agreed to proceed forward.  Operative procedure: Patient brought into the or was induced under general anesthesia positioned prone the Wilson frame her back was prepped and draped in routine sterile fashion. Her old incision was opened up and scar tissues dissected free and subperiosteal dissection was carried out in what was initially identified as of the L4-5 disc space levels attention taken one interspace below this and reconfirm with repeat x-ray and then the scar tissues dissected off of the laminotomy defect laminotomy defect was extended laterally and inferiorly removing some of the residual the S1 lamina and medial facet complex. I then identified the left S1 pedicle in the left S1 nerve root was dissected off of the pedicle then identified the disc space medially identified large recurrent disc herniation and incised through through the scar and annulus removed several large fragments of recurrent disc cleaned out the disc space due to rongeurs and again the discectomy there is no further stenosis on thecal sac S1 foramen or S1 nerve root. Wounds and to see her get meticulous in space was  maintained Gelfoam was opened up the dura the muscle fascia proximal in layers with after Vicryl skin was closed running 4 subcuticular Dermabond benzo and Steri-Strips and sterile dressing was applied patient recovered in stable condition. At the end of case on it counts sponge counts were correct.

## 2017-09-01 NOTE — Anesthesia Postprocedure Evaluation (Signed)
Anesthesia Post Note  Patient: STEPHANIA MACFARLANE  Procedure(s) Performed: Microdiscectomy - Lumbar Five-Sacral One - left redo (Left Spine Lumbar)     Patient location during evaluation: PACU Anesthesia Type: General Level of consciousness: awake and alert Pain management: pain level controlled Vital Signs Assessment: post-procedure vital signs reviewed and stable Respiratory status: spontaneous breathing, nonlabored ventilation and respiratory function stable Cardiovascular status: blood pressure returned to baseline and stable Postop Assessment: no apparent nausea or vomiting Anesthetic complications: no    Last Vitals:  Vitals:   09/01/17 1122 09/01/17 1127  BP: (!) 114/54   Pulse: 67 68  Resp: 11 10  Temp:    SpO2: 100% 100%    Last Pain:  Vitals:   09/01/17 1127  TempSrc:   PainSc: 5                  Angelina Neece,W. EDMOND

## 2017-09-01 NOTE — Evaluation (Signed)
Physical Therapy Evaluation Patient Details Name: Jennifer Boyer MRN: [id number] DOB: [birth year] Today's Date: 09/01/2017   History of Present Illness  Jennifer Boyer is a 77 y.o F s/p Redo of  L5-S1 microdiscectomy. PMH includes L4-5 laminectomy and foraminotomies, fibromyalgia, T2DM, HTN, GERD, Hypothyroidism, Hiatal hernia, and bilat mastectomy.  Clinical Impression  Pt presents with problems above and deficits below. Pt and family were educated on precautions. Pt was educated on generalized walking program. Pt performed bed mobility with MinG, and sit<>stand transfers with RW and MinA. Pt ambulated with RW and Supervision-MinG. Pt has some unsteadiness without RW and is guarded of operative site. Expect pt to progress well. Will continue to follow acutely to support independence, mobility, and safety.     Follow Up Recommendations No PT follow up    Equipment Recommendations  Rolling walker with 5" wheels    Recommendations for Other Services OT consult     Precautions / Restrictions Precautions Precautions: Fall;Back Precaution Booklet Issued: Yes (comment) Precaution Comments: Pt educated on back precautions and generalized walking program Restrictions Weight Bearing Restrictions: No      Mobility  Bed Mobility Overal bed mobility: Needs Assistance Bed Mobility: Sidelying to Sit;Sit to Sidelying;Rolling Rolling: Min guard Sidelying to sit: Min guard     Sit to sidelying: Min guard General bed mobility comments: Pt required cues for sequencing to get in and out of bed, and was educated on precautions to perform Log roll technique. Pt was able to move LE across bed without physical assist. Pt had some difficulty with trunk elevation, but required no physical assist.   Transfers Overall transfer level: Needs assistance Equipment used: Rolling walker (2 wheeled) Transfers: Sit to/from Stand Sit to Stand: Min assist;Min guard         General transfer comment: Pt  required MinA and RW to perform sit<>stand transfsers. Pt needed assist for initial power up, and some steadying once standing. Pt required MinG for descent during transfers.   Ambulation/Gait Ambulation/Gait assistance: Min guard;Supervision Gait Distance (Feet): 175 Feet Assistive device: Rolling walker (2 wheeled) Gait Pattern/deviations: Step-through pattern;Decreased stride length;Narrow base of support Gait velocity: Decreased   General Gait Details: Pt ambulated slowly and was guarded throughout gait using RW and Supervision-MinG. Pt had no visible LOB, but likely would be unsteady without RW. Pt had a narrow base of support and good posture.   Stairs            Wheelchair Mobility    Modified Rankin (Stroke Patients Only)       Balance Overall balance assessment: Needs assistance Sitting-balance support: Feet supported;No upper extremity supported Sitting balance-Leahy Scale: Fair Sitting balance - Comments: Pt was able to scoot hips to EOB.   Standing balance support: Bilateral upper extremity supported;No upper extremity supported Standing balance-Leahy Scale: Fair Standing balance comment: Pt reliant on RW for balance during ambulation. Pt was able to stand and toilet without BUE support and had no visible LOB, but had some unsteadiness.                              Pertinent Vitals/Pain Pain Assessment: 0-10 Pain Score: 3  Pain Location: Incision Pain Descriptors / Indicators: Grimacing;Guarding;Operative site guarding Pain Intervention(s): Limited activity within patient's tolerance;Monitored during session;Repositioned    Home Living Family/patient expects to be discharged to:: Private residence Living Arrangements: Spouse/significant other Available Help at Discharge: Family;Available 24 hours/day Type of Home: House Home Access: Level  entry     Home Layout: One level Home Equipment: [place] - single point;Tub bench;Toilet riser       Prior Function Level of Independence: Independent;Independent with assistive device(s)         Comments: Pt reports she was using her cane to get around occasionally, but was largely independent after her last surgery     Hand Dominance   Dominant Hand: Right    Extremity/Trunk Assessment   Upper Extremity Assessment Upper Extremity Assessment: Defer to OT evaluation    Lower Extremity Assessment Lower Extremity Assessment: LLE deficits/detail;Generalized weakness LLE Deficits / Details: Pt reports improvement of pain in L LE.    Cervical / Trunk Assessment Cervical / Trunk Assessment: Other exceptions Cervical / Trunk Exceptions: s/p L5-S1 microdiscectomy  Communication   Communication: No difficulties  Cognition Arousal/Alertness: Awake/alert Behavior During Therapy: WFL for tasks assessed/performed Overall Cognitive Status: Within Functional Limits for tasks assessed                                 General Comments: Pt is pleasant      General Comments      Exercises     Assessment/Plan    PT Assessment Patient needs continued PT services  PT Problem List Decreased strength;Decreased range of motion;Decreased activity tolerance;Decreased balance;Decreased mobility;Decreased knowledge of use of DME;Decreased knowledge of precautions;Pain       PT Treatment Interventions DME instruction;Gait training;Functional mobility training;Therapeutic activities;Therapeutic exercise;Balance training;Patient/family education    PT Goals (Current goals can be found in the Care Plan section)  Acute Rehab PT Goals Patient Stated Goal: To go home PT Goal Formulation: With patient Time For Goal Achievement: 09/15/17 Potential to Achieve Goals: Good    Frequency Min 5X/week   Barriers to discharge        Co-evaluation               AM-PAC PT "6 Clicks" Daily Activity  Outcome Measure Difficulty turning over in bed (including adjusting  bedclothes, sheets and blankets)?: Unable Difficulty moving from lying on back to sitting on the side of the bed? : Unable Difficulty sitting down on and standing up from a chair with arms (e.g., wheelchair, bedside commode, etc,.)?: Unable Help needed moving to and from a bed to chair (including a wheelchair)?: A Little Help needed walking in hospital room?: A Little Help needed climbing 3-5 steps with a railing? : A Lot 6 Click Score: 11    End of Session Equipment Utilized During Treatment: Gait belt Activity Tolerance: Patient limited by pain Patient left: with call bell/phone within reach;in bed;with family/visitor present Nurse Communication: Mobility status PT Visit Diagnosis: Unsteadiness on feet (R26.81);Other abnormalities of gait and mobility (R26.89);Muscle weakness (generalized) (M62.81);Difficulty in walking, not elsewhere classified (R26.2);Pain Pain - part of body: (Back)    Time: 6948-5462 PT Time Calculation (min) (ACUTE ONLY): 28 min   Charges:   PT Evaluation $PT Eval Low Complexity: 1 Low PT Treatments $Gait Training: 8-22 mins      Elwin Mocha, S-DPT [place] Student [phone]   09/01/2017, 3:56 PM

## 2017-09-01 NOTE — Transfer of Care (Signed)
Immediate Anesthesia Transfer of Care Note  Patient: Jennifer Boyer  Procedure(s) Performed: Microdiscectomy - Lumbar Five-Sacral One - left redo (Left Spine Lumbar)  Patient Location: PACU  Anesthesia Type:General  Level of Consciousness: awake, alert , oriented and patient cooperative  Airway & Oxygen Therapy: Patient Spontanous Breathing and Patient connected to nasal cannula oxygen  Post-op Assessment: Report given to RN and Post -op Vital signs reviewed and stable  Post vital signs: Reviewed and stable  Last Vitals:  Vitals Value Taken Time  BP 130/68 09/01/2017 10:52 AM  Temp    Pulse 92 09/01/2017 10:56 AM  Resp 13 09/01/2017 10:56 AM  SpO2 97 % 09/01/2017 10:56 AM  Vitals shown include unvalidated device data.  Last Pain:  Vitals:   09/01/17 0712  TempSrc:   PainSc: 2          Complications: No apparent anesthesia complications

## 2017-09-01 NOTE — Discharge Instructions (Signed)

## 2017-09-01 NOTE — H&P (Signed)
Jennifer Boyer is an 77 y.o. female.   Chief Complaint: back and left leg pain HPI: 77 year old female with long-standing back pain radiating down and the S1 nerve root atL5-S1. Due to patient's failure conservative treatment progression of clinical syndro I have recommended a redo laminectomy and discectomy at L5-S1 on the left. I extensively went over the risks and benefits of the operation as well as perioperative course,  expectations of outcomes and alternatives to surgery and the patient understands and agrees to proceed  Forward.  Past Medical History:  Diagnosis Date  . Arthritis    OA  . Complication of anesthesia    pt has been told she is hard to intubate.  . Diabetes mellitus without complication ([place])   . Difficult intubation   . Fibromyalgia   . GERD (gastroesophageal reflux disease)   . Headache    HX MIGRAINES  NONE IN LONG TIME  . History of hiatal hernia   . Hypertension   . Hypothyroidism     Past Surgical History:  Procedure Laterality Date  . BREAST SURGERY     bilateral mastectomy  . CHOLECYSTECTOMY    . DILATATION & CURETTAGE/HYSTEROSCOPY WITH TRUECLEAR N/A 09/30/2012   Procedure: DILATATION & CURETTAGE/HYSTEROSCOPY WITH TRUECLEAR;  Surgeon: Shon Millet II, MD;  Location: [facility];  Service: Gynecology;  Laterality: N/A;  . DILATION AND CURETTAGE OF UTERUS     uterine polyp  . KNEE SURGERY    . LUMBAR LAMINECTOMY/DECOMPRESSION MICRODISCECTOMY Left 03/24/2017   Procedure: LEFT LUMBAR FIVE-SACRAL ONE MICRODISCECTOMY, LEFT LUMBAR FOUR-FIVE DECOMPRESSIVE LAMINECTOMY;  Surgeon: Kary Kos, MD;  Location: [facility];  Service: Neurosurgery;  Laterality: Left;  Left L4-5 L5-S1 Laminectomy/Foraminotomy  . TONSILLECTOMY      History reviewed. No pertinent family history. Social History:  reports that she has never smoked. She has never used smokeless tobacco. She reports that she does not drink alcohol or use drugs.  Allergies: No Known Allergies  Medications Prior  to Admission  Medication Sig Dispense Refill  . acetaminophen (TYLENOL) 500 MG tablet Take 500-1,000 mg by mouth every 6 (six) hours as needed for moderate pain.    Marland Kitchen amLODipine (NORVASC) 5 MG tablet Take 5 mg by mouth daily.    . benazepril (LOTENSIN) 20 MG tablet Take 20 mg by mouth daily.    . cholecalciferol (VITAMIN D) 1000 units tablet Take 1,000 Units by mouth once a week.     . cyclobenzaprine (FLEXERIL) 5 MG tablet Take 5 mg by mouth 2 (two) times daily as needed for muscle spasms.    . fluticasone (FLONASE) 50 MCG/ACT nasal spray Place 2 sprays into the nose daily as needed for allergies.     Marland Kitchen gabapentin (NEURONTIN) 300 MG capsule Take 300 mg by mouth 3 (three) times daily.    Marland Kitchen levothyroxine (SYNTHROID, LEVOTHROID) 75 MCG tablet Take 75 mcg by mouth daily before breakfast.    . meloxicam (MOBIC) 15 MG tablet Take 1 tablet (15 mg total) by mouth daily. 30 tablet 1  . metFORMIN (GLUCOPHAGE) 1000 MG tablet Take 1,000 mg by mouth 2 (two) times daily with a meal.    . OVER THE COUNTER MEDICATION Apply 1 application topically daily as needed (pain). CBD roll on    . oxybutynin (DITROPAN) 5 MG tablet Take 5 mg by mouth 2 (two) times daily.    Marland Kitchen oxycodone (OXY-IR) 5 MG capsule Take 5 mg by mouth every 6 (six) hours as needed for pain.    Marland Kitchen  pantoprazole (PROTONIX) 40 MG tablet Take 40 mg by mouth daily.    . simvastatin (ZOCOR) 40 MG tablet Take 40 mg by mouth every evening.    . sitaGLIPtin (JANUVIA) 100 MG tablet Take 100 mg by mouth every evening.     . cyclobenzaprine (FLEXERIL) 10 MG tablet Take 1 tablet (10 mg total) by mouth 3 (three) times daily as needed for muscle spasms. (Patient not taking: Reported on 08/17/2017) 30 tablet 0  . oxyCODONE 10 MG TABS Take 1 tablet (10 mg total) by mouth every 3 (three) hours as needed for severe pain ((score 7 to 10)). (Patient not taking: Reported on 08/17/2017) 40 tablet 0    Results for orders placed or performed during the hospital encounter of  09/01/17 (from the past 48 hour(s))  Glucose, capillary     Status: Abnormal   Collection Time: 09/01/17  7:01 AM  Result Value Ref Range   Glucose-Capillary 166 (H) 70 - 99 mg/dL   No results found.  Review of Systems  Musculoskeletal: Positive for back pain.  Neurological: Positive for tingling and sensory change.    Blood pressure (!) 146/58, pulse 81, temperature 97.7 F (36.5 C), temperature source Oral, resp. rate 18, SpO2 98 %. Physical Exam  Constitutional: She is oriented to person, place, and time. She appears well-developed and well-nourished.  HENT:  Head: Normocephalic.  Eyes: Pupils are equal, round, and reactive to light.  Neck: Normal range of motion.  Respiratory: Effort normal.  GI: Soft. Bowel sounds are normal.  Musculoskeletal: Normal range of motion.  Neurological: She is alert and oriented to person, place, and time. She has normal strength. GCS eye subscore is 4. GCS verbal subscore is 5. GCS motor subscore is 6.  Strength is 5 out of 5 iliopsoas, quads, Hamstrings, anterior tibialis, gastrocs.     Assessment/Plan 77 year female presents for redo laminectomy and microdiscectomy at L5-S1 on the left  Kona Yusuf P, MD 09/01/2017, 8:16 AM

## 2017-09-02 ENCOUNTER — Encounter (HOSPITAL_COMMUNITY): Payer: Self-pay | Admitting: Neurosurgery

## 2017-09-02 DIAGNOSIS — M5117 Intervertebral disc disorders with radiculopathy, lumbosacral region: Secondary | ICD-10-CM | POA: Diagnosis not present

## 2017-09-02 LAB — GLUCOSE, CAPILLARY: GLUCOSE-CAPILLARY: 148 mg/dL — AB (ref 70–99)

## 2017-09-02 MED ORDER — OXYCODONE HCL 10 MG PO TABS: 10.0000 mg | ORAL_TABLET | ORAL | 0 refills | Status: DC | PRN

## 2017-09-02 NOTE — Progress Notes (Signed)
Physical Therapy Treatment Patient Details Name: Jennifer Boyer MRN: [id number] DOB: 04/12/1940 Today's Date: 09/02/2017    History of Present Illness [place] Hair is a 77 y.o F s/p Redo of  L5-S1 microdiscectomy. PMH includes L4-5 laminectomy and foraminotomies, fibromyalgia, T2DM, HTN, GERD, Hypothyroidism, Hiatal hernia, and bilat mastectomy.    PT Comments    Pt progressing towards physical therapy goals. Was able to perform transfers and ambulation with gross supervision for safety and RW for support. Pt anticipates d/c home today. Pt was educated on car transfer, precautions, and general safety with activity progression. Will continue to follow and progress as able per POC.    Follow Up Recommendations  No PT follow up     Equipment Recommendations  Rolling walker with 5" wheels    Recommendations for Other Services OT consult     Precautions / Restrictions Precautions Precautions: Fall;Back Precaution Booklet Issued: Yes (comment) Precaution Comments: Pt educated on back precautions and generalized walking program Restrictions Weight Bearing Restrictions: No    Mobility  Bed Mobility                  Transfers Overall transfer level: Needs assistance Equipment used: Rolling walker (2 wheeled) Transfers: Sit to/from Stand Sit to Stand: Supervision         General transfer comment: Supervision for safety as pt powered up to full stand. VC's for hand placement on seated surface for safety.   Ambulation/Gait Ambulation/Gait assistance: Supervision;Min guard Gait Distance (Feet): 400 Feet Assistive device: Rolling walker (2 wheeled) Gait Pattern/deviations: Step-through pattern;Decreased stride length;Narrow base of support Gait velocity: Decreased Gait velocity interpretation: 1.31 - 2.62 ft/sec, indicative of limited community ambulator General Gait Details: Slow but generally steady. Pt initially required min guard assist progressing to supervision  for safety.    Stairs             Wheelchair Mobility    Modified Rankin (Stroke Patients Only)       Balance Overall balance assessment: Needs assistance Sitting-balance support: Feet supported;No upper extremity supported Sitting balance-Leahy Scale: Fair     Standing balance support: Bilateral upper extremity supported;No upper extremity supported Standing balance-Leahy Scale: Fair Standing balance comment: Pt reliant on RW for balance during ambulation. Pt was able to stand and toilet without BUE support and had no visible LOB, but had some unsteadiness.                             Cognition Arousal/Alertness: Awake/alert Behavior During Therapy: WFL for tasks assessed/performed Overall Cognitive Status: Within Functional Limits for tasks assessed                                 General Comments: Pt is pleasant      Exercises      General Comments        Pertinent Vitals/Pain Pain Assessment: Faces Faces Pain Scale: Hurts a little bit Pain Location: Incision Pain Descriptors / Indicators: Operative site guarding Pain Intervention(s): Monitored during session    Home Living Family/patient expects to be discharged to:: Private residence Living Arrangements: Spouse/significant other Available Help at Discharge: Family;Available 24 hours/day Type of Home: House Home Access: Level entry   Home Layout: One level Home Equipment: Cane - single point;Tub bench;Toilet riser      Prior Function Level of Independence: Independent;Independent with assistive device(s)  Comments: Pt reports she was using her cane to get around occasionally, but was largely independent after her last surgery   PT Goals (current goals can now be found in the care plan section) Acute Rehab PT Goals Patient Stated Goal: To go home PT Goal Formulation: With patient Time For Goal Achievement: 09/15/17 Potential to Achieve Goals: Good Progress  towards PT goals: Progressing toward goals    Frequency    Min 5X/week      PT Plan Current plan remains appropriate    Co-evaluation              AM-PAC PT "6 Clicks" Daily Activity  Outcome Measure  Difficulty turning over in bed (including adjusting bedclothes, sheets and blankets)?: None Difficulty moving from lying on back to sitting on the side of the bed? : A Little Difficulty sitting down on and standing up from a chair with arms (e.g., wheelchair, bedside commode, etc,.)?: A Little Help needed moving to and from a bed to chair (including a wheelchair)?: A Little Help needed walking in hospital room?: A Little Help needed climbing 3-5 steps with a railing? : A Little 6 Click Score: 19    End of Session Equipment Utilized During Treatment: Gait belt Activity Tolerance: Patient limited by pain Patient left: with call bell/phone within reach;in bed;with family/visitor present Nurse Communication: Mobility status PT Visit Diagnosis: Unsteadiness on feet (R26.81);Other abnormalities of gait and mobility (R26.89);Muscle weakness (generalized) (M62.81);Difficulty in walking, not elsewhere classified (R26.2);Pain Pain - part of body: (Back)     Time: 4935-5217 PT Time Calculation (min) (ACUTE ONLY): 23 min  Charges:  $Gait Training: 23-37 mins                     Rolinda Roan, PT, DPT Acute Rehabilitation Services Pager: [phone]    Thelma Comp 09/02/2017, 11:53 AM

## 2017-09-02 NOTE — Discharge Summary (Signed)
Physician Discharge Summary  Patient ID: Jennifer Boyer MRN: [id number] DOB/AGE: 76-02-1940 76 y.o. Estimated body mass index is 36.57 kg/m as calculated from the following:   Height as of 08/24/17: 5' 6.5" (1.689 m).   Weight as of 08/24/17: 104.3 kg.   Admit date: 09/01/2017 Discharge date: 09/02/2017  Admission Diagnoses: Recurrent ruptured disc L5-S1 left  Discharge Diagnoses: Active Problems:   HNP (herniated nucleus pulposus), lumbar   Discharged Condition: good  Hospital Course: Patient was admitted underwent recurrent redo lumbar lobectomy discectomy postoperatively patient did very well and covering for on the floor was able eating and voiding spontaneously tolerating regular diet stable for discharge home.  Patient will be discharged scheduled follow-up in 1 to 2 weeks.  Consults: Significant Diagnostic Studies: Treatments: Redo laminectomy micro discectomy L5-S1 left Discharge Exam: Blood pressure (!) 112/57, pulse 64, temperature 98.2 F (36.8 C), temperature source Oral, resp. rate 16, SpO2 96 %. Strength 5 out of 5 wound clean dry and intact  Disposition: Home   Allergies as of 09/02/2017   No Known Allergies     Medication List    TAKE these medications   acetaminophen 500 MG tablet Commonly known as:  TYLENOL Take 500-1,000 mg by mouth every 6 (six) hours as needed for moderate pain.   amLODipine 5 MG tablet Commonly known as:  NORVASC Take 5 mg by mouth daily.   benazepril 20 MG tablet Commonly known as:  LOTENSIN Take 20 mg by mouth daily.   cholecalciferol 1000 units tablet Commonly known as:  VITAMIN D Take 1,000 Units by mouth once a week.   cyclobenzaprine 5 MG tablet Commonly known as:  FLEXERIL Take 5 mg by mouth 2 (two) times daily as needed for muscle spasms.   cyclobenzaprine 10 MG tablet Commonly known as:  FLEXERIL Take 1 tablet (10 mg total) by mouth 3 (three) times daily as needed for muscle spasms.   fluticasone 50 MCG/ACT  nasal spray Commonly known as:  FLONASE Place 2 sprays into the nose daily as needed for allergies.   gabapentin 300 MG capsule Commonly known as:  NEURONTIN Take 300 mg by mouth 3 (three) times daily.   levothyroxine 75 MCG tablet Commonly known as:  SYNTHROID, LEVOTHROID Take 75 mcg by mouth daily before breakfast.   meloxicam 15 MG tablet Commonly known as:  MOBIC Take 1 tablet (15 mg total) by mouth daily.   metFORMIN 1000 MG tablet Commonly known as:  GLUCOPHAGE Take 1,000 mg by mouth 2 (two) times daily with a meal.   OVER THE COUNTER MEDICATION Apply 1 application topically daily as needed (pain). CBD roll on   oxybutynin 5 MG tablet Commonly known as:  DITROPAN Take 5 mg by mouth 2 (two) times daily.   oxycodone 5 MG capsule Commonly known as:  OXY-IR Take 5 mg by mouth every 6 (six) hours as needed for pain. What changed:  Another medication with the same name was changed. Make sure you understand how and when to take each.   Oxycodone HCl 10 MG Tabs Take 1 tablet (10 mg total) by mouth every 3 (three) hours as needed ((score 7 to 10)). What changed:  reasons to take this   pantoprazole 40 MG tablet Commonly known as:  PROTONIX Take 40 mg by mouth daily.   simvastatin 40 MG tablet Commonly known as:  ZOCOR Take 40 mg by mouth every evening.   sitaGLIPtin 100 MG tablet Commonly known as:  JANUVIA Take 100 mg by mouth  every evening.      Follow-up Information    Kary Kos, MD Follow up.   Specialty:  Neurosurgery Contact information: [address] [place] [address] [place] [address] [phone]           Signed: Elaina Hoops 09/02/2017, 8:10 AM

## 2017-09-02 NOTE — Progress Notes (Signed)
Patient is discharged via wheelchair after received discharge instructions, after care and Rx for Oxycodone to be taken to pharmacy.

## 2017-09-24 DIAGNOSIS — E559 Vitamin D deficiency, unspecified: Secondary | ICD-10-CM | POA: Diagnosis not present

## 2017-09-24 DIAGNOSIS — N3281 Overactive bladder: Secondary | ICD-10-CM | POA: Diagnosis not present

## 2017-09-24 DIAGNOSIS — E7800 Pure hypercholesterolemia, unspecified: Secondary | ICD-10-CM | POA: Diagnosis not present

## 2017-09-24 DIAGNOSIS — M1990 Unspecified osteoarthritis, unspecified site: Secondary | ICD-10-CM | POA: Diagnosis not present

## 2017-09-24 DIAGNOSIS — M5116 Intervertebral disc disorders with radiculopathy, lumbar region: Secondary | ICD-10-CM | POA: Diagnosis not present

## 2017-09-24 DIAGNOSIS — R030 Elevated blood-pressure reading, without diagnosis of hypertension: Secondary | ICD-10-CM | POA: Diagnosis not present

## 2017-09-24 DIAGNOSIS — E039 Hypothyroidism, unspecified: Secondary | ICD-10-CM | POA: Diagnosis not present

## 2017-09-24 DIAGNOSIS — E119 Type 2 diabetes mellitus without complications: Secondary | ICD-10-CM | POA: Diagnosis not present

## 2017-10-06 DIAGNOSIS — M48061 Spinal stenosis, lumbar region without neurogenic claudication: Secondary | ICD-10-CM | POA: Diagnosis not present

## 2017-10-06 DIAGNOSIS — M5127 Other intervertebral disc displacement, lumbosacral region: Secondary | ICD-10-CM | POA: Diagnosis not present

## 2017-10-06 DIAGNOSIS — M5440 Lumbago with sciatica, unspecified side: Secondary | ICD-10-CM | POA: Diagnosis not present

## 2017-10-13 ENCOUNTER — Other Ambulatory Visit: Payer: Self-pay | Admitting: Neurosurgery

## 2017-10-13 DIAGNOSIS — M5440 Lumbago with sciatica, unspecified side: Secondary | ICD-10-CM

## 2017-10-28 ENCOUNTER — Ambulatory Visit
Admission: RE | Admit: 2017-10-28 | Discharge: 2017-10-28 | Disposition: A | Discharge status: Home / Self Care | Payer: Medicare HMO | Source: Ambulatory Visit | Attending: Neurosurgery | Admitting: Neurosurgery

## 2017-10-28 DIAGNOSIS — M5440 Lumbago with sciatica, unspecified side: Secondary | ICD-10-CM

## 2017-10-28 DIAGNOSIS — M47817 Spondylosis without myelopathy or radiculopathy, lumbosacral region: Secondary | ICD-10-CM | POA: Diagnosis not present

## 2017-10-28 MED ORDER — METHYLPREDNISOLONE ACETATE 40 MG/ML INJ SUSP (RADIOLOG
120.0000 mg | Freq: Once | INTRAMUSCULAR | Status: AC
  Administered 2017-10-28: 120 mg via EPIDURAL

## 2017-10-28 MED ORDER — IOPAMIDOL (ISOVUE-M 200) INJECTION 41%
1.0000 mL | Freq: Once | INTRAMUSCULAR | Status: AC
  Administered 2017-10-28: 1 mL via EPIDURAL

## 2017-11-09 DIAGNOSIS — R634 Abnormal weight loss: Secondary | ICD-10-CM | POA: Diagnosis not present

## 2017-11-09 DIAGNOSIS — R110 Nausea: Secondary | ICD-10-CM | POA: Diagnosis not present

## 2017-11-19 DIAGNOSIS — K2930 Chronic superficial gastritis without bleeding: Secondary | ICD-10-CM | POA: Diagnosis not present

## 2017-11-19 DIAGNOSIS — K222 Esophageal obstruction: Secondary | ICD-10-CM | POA: Diagnosis not present

## 2017-11-19 DIAGNOSIS — R110 Nausea: Secondary | ICD-10-CM | POA: Diagnosis not present

## 2017-11-19 DIAGNOSIS — K449 Diaphragmatic hernia without obstruction or gangrene: Secondary | ICD-10-CM | POA: Diagnosis not present

## 2017-11-24 DIAGNOSIS — K2930 Chronic superficial gastritis without bleeding: Secondary | ICD-10-CM | POA: Diagnosis not present

## 2017-12-08 DIAGNOSIS — Z800 Family history of malignant neoplasm of digestive organs: Secondary | ICD-10-CM | POA: Diagnosis not present

## 2017-12-08 DIAGNOSIS — R634 Abnormal weight loss: Secondary | ICD-10-CM | POA: Diagnosis not present

## 2017-12-08 DIAGNOSIS — R110 Nausea: Secondary | ICD-10-CM | POA: Diagnosis not present

## 2017-12-08 DIAGNOSIS — Z86010 Personal history of colonic polyps: Secondary | ICD-10-CM | POA: Diagnosis not present

## 2017-12-24 DIAGNOSIS — E119 Type 2 diabetes mellitus without complications: Secondary | ICD-10-CM | POA: Diagnosis not present

## 2017-12-27 DIAGNOSIS — N3281 Overactive bladder: Secondary | ICD-10-CM | POA: Diagnosis not present

## 2017-12-27 DIAGNOSIS — R110 Nausea: Secondary | ICD-10-CM | POA: Diagnosis not present

## 2017-12-27 DIAGNOSIS — E039 Hypothyroidism, unspecified: Secondary | ICD-10-CM | POA: Diagnosis not present

## 2017-12-27 DIAGNOSIS — E7800 Pure hypercholesterolemia, unspecified: Secondary | ICD-10-CM | POA: Diagnosis not present

## 2017-12-27 DIAGNOSIS — E119 Type 2 diabetes mellitus without complications: Secondary | ICD-10-CM | POA: Diagnosis not present

## 2017-12-27 DIAGNOSIS — M5116 Intervertebral disc disorders with radiculopathy, lumbar region: Secondary | ICD-10-CM | POA: Diagnosis not present

## 2017-12-27 DIAGNOSIS — E559 Vitamin D deficiency, unspecified: Secondary | ICD-10-CM | POA: Diagnosis not present

## 2017-12-27 DIAGNOSIS — M1990 Unspecified osteoarthritis, unspecified site: Secondary | ICD-10-CM | POA: Diagnosis not present

## 2017-12-29 DIAGNOSIS — R69 Illness, unspecified: Secondary | ICD-10-CM | POA: Diagnosis not present

## 2018-01-13 DIAGNOSIS — Z1231 Encounter for screening mammogram for malignant neoplasm of breast: Secondary | ICD-10-CM | POA: Diagnosis not present

## 2018-01-20 DIAGNOSIS — R350 Frequency of micturition: Secondary | ICD-10-CM | POA: Diagnosis not present

## 2018-01-20 DIAGNOSIS — R351 Nocturia: Secondary | ICD-10-CM | POA: Diagnosis not present

## 2018-01-20 DIAGNOSIS — N3946 Mixed incontinence: Secondary | ICD-10-CM | POA: Diagnosis not present

## 2018-01-20 DIAGNOSIS — N3942 Incontinence without sensory awareness: Secondary | ICD-10-CM | POA: Diagnosis not present

## 2018-01-20 DIAGNOSIS — N3944 Nocturnal enuresis: Secondary | ICD-10-CM | POA: Diagnosis not present

## 2018-01-28 DIAGNOSIS — H52223 Regular astigmatism, bilateral: Secondary | ICD-10-CM | POA: Diagnosis not present

## 2018-01-28 DIAGNOSIS — H5203 Hypermetropia, bilateral: Secondary | ICD-10-CM | POA: Diagnosis not present

## 2018-01-28 DIAGNOSIS — E119 Type 2 diabetes mellitus without complications: Secondary | ICD-10-CM | POA: Diagnosis not present

## 2018-02-03 DIAGNOSIS — E119 Type 2 diabetes mellitus without complications: Secondary | ICD-10-CM | POA: Diagnosis not present

## 2018-02-03 DIAGNOSIS — E7800 Pure hypercholesterolemia, unspecified: Secondary | ICD-10-CM | POA: Diagnosis not present

## 2018-02-03 DIAGNOSIS — E039 Hypothyroidism, unspecified: Secondary | ICD-10-CM | POA: Diagnosis not present

## 2018-02-03 DIAGNOSIS — M5116 Intervertebral disc disorders with radiculopathy, lumbar region: Secondary | ICD-10-CM | POA: Diagnosis not present

## 2018-02-03 DIAGNOSIS — E559 Vitamin D deficiency, unspecified: Secondary | ICD-10-CM | POA: Diagnosis not present

## 2018-02-08 DIAGNOSIS — K219 Gastro-esophageal reflux disease without esophagitis: Secondary | ICD-10-CM | POA: Diagnosis not present

## 2018-02-08 DIAGNOSIS — R634 Abnormal weight loss: Secondary | ICD-10-CM | POA: Diagnosis not present

## 2018-02-08 DIAGNOSIS — Z86010 Personal history of colonic polyps: Secondary | ICD-10-CM | POA: Diagnosis not present

## 2018-02-08 DIAGNOSIS — R110 Nausea: Secondary | ICD-10-CM | POA: Diagnosis not present

## 2018-02-10 DIAGNOSIS — R350 Frequency of micturition: Secondary | ICD-10-CM | POA: Diagnosis not present

## 2018-02-10 DIAGNOSIS — N3946 Mixed incontinence: Secondary | ICD-10-CM | POA: Diagnosis not present

## 2018-02-17 ENCOUNTER — Other Ambulatory Visit: Payer: Self-pay | Admitting: Neurosurgery

## 2018-02-17 DIAGNOSIS — N819 Female genital prolapse, unspecified: Secondary | ICD-10-CM | POA: Diagnosis not present

## 2018-02-17 DIAGNOSIS — M5440 Lumbago with sciatica, unspecified side: Secondary | ICD-10-CM

## 2018-02-17 DIAGNOSIS — N952 Postmenopausal atrophic vaginitis: Secondary | ICD-10-CM | POA: Diagnosis not present

## 2018-02-17 DIAGNOSIS — M1990 Unspecified osteoarthritis, unspecified site: Secondary | ICD-10-CM | POA: Insufficient documentation

## 2018-02-17 DIAGNOSIS — Z6835 Body mass index (BMI) 35.0-35.9, adult: Secondary | ICD-10-CM | POA: Diagnosis not present

## 2018-02-17 DIAGNOSIS — Z01419 Encounter for gynecological examination (general) (routine) without abnormal findings: Secondary | ICD-10-CM | POA: Diagnosis not present

## 2018-02-18 DIAGNOSIS — N3942 Incontinence without sensory awareness: Secondary | ICD-10-CM | POA: Diagnosis not present

## 2018-02-18 DIAGNOSIS — N3946 Mixed incontinence: Secondary | ICD-10-CM | POA: Diagnosis not present

## 2018-03-11 ENCOUNTER — Ambulatory Visit
Admission: RE | Admit: 2018-03-11 | Discharge: 2018-03-11 | Disposition: A | Discharge status: Home / Self Care | Payer: Medicare HMO | Source: Ambulatory Visit | Attending: Neurosurgery | Admitting: Neurosurgery

## 2018-03-11 DIAGNOSIS — M545 Low back pain: Secondary | ICD-10-CM | POA: Diagnosis not present

## 2018-03-11 DIAGNOSIS — M5440 Lumbago with sciatica, unspecified side: Secondary | ICD-10-CM

## 2018-03-11 MED ORDER — IOPAMIDOL (ISOVUE-M 200) INJECTION 41%
1.0000 mL | Freq: Once | INTRAMUSCULAR | Status: AC
  Administered 2018-03-11: 1 mL via INTRA_ARTICULAR

## 2018-03-11 MED ORDER — METHYLPREDNISOLONE ACETATE 40 MG/ML INJ SUSP (RADIOLOG
120.0000 mg | Freq: Once | INTRAMUSCULAR | Status: AC
  Administered 2018-03-11: 120 mg via INTRA_ARTICULAR

## 2018-03-15 DIAGNOSIS — D123 Benign neoplasm of transverse colon: Secondary | ICD-10-CM | POA: Diagnosis not present

## 2018-03-15 DIAGNOSIS — Z86010 Personal history of colonic polyps: Secondary | ICD-10-CM | POA: Diagnosis not present

## 2018-03-18 DIAGNOSIS — D123 Benign neoplasm of transverse colon: Secondary | ICD-10-CM | POA: Diagnosis not present

## 2018-03-29 DIAGNOSIS — E119 Type 2 diabetes mellitus without complications: Secondary | ICD-10-CM | POA: Diagnosis not present

## 2018-04-01 DIAGNOSIS — M5126 Other intervertebral disc displacement, lumbar region: Secondary | ICD-10-CM | POA: Diagnosis not present

## 2018-04-01 DIAGNOSIS — I70223 Atherosclerosis of native arteries of extremities with rest pain, bilateral legs: Secondary | ICD-10-CM | POA: Diagnosis not present

## 2018-04-07 DIAGNOSIS — N3942 Incontinence without sensory awareness: Secondary | ICD-10-CM | POA: Diagnosis not present

## 2018-04-07 DIAGNOSIS — N3946 Mixed incontinence: Secondary | ICD-10-CM | POA: Diagnosis not present

## 2018-04-26 ENCOUNTER — Other Ambulatory Visit: Payer: Self-pay

## 2018-04-26 DIAGNOSIS — I70229 Atherosclerosis of native arteries of extremities with rest pain, unspecified extremity: Secondary | ICD-10-CM

## 2018-05-09 ENCOUNTER — Ambulatory Visit (HOSPITAL_COMMUNITY)
Admission: RE | Admit: 2018-05-09 | Discharge: 2018-05-09 | Disposition: A | Discharge status: Home / Self Care | Payer: Medicare HMO | Source: Ambulatory Visit | Attending: Vascular Surgery | Admitting: Vascular Surgery

## 2018-05-09 ENCOUNTER — Other Ambulatory Visit: Payer: Self-pay

## 2018-05-09 DIAGNOSIS — I70229 Atherosclerosis of native arteries of extremities with rest pain, unspecified extremity: Secondary | ICD-10-CM | POA: Diagnosis not present

## 2018-05-10 DIAGNOSIS — N3946 Mixed incontinence: Secondary | ICD-10-CM | POA: Diagnosis not present

## 2018-05-10 DIAGNOSIS — R351 Nocturia: Secondary | ICD-10-CM | POA: Diagnosis not present

## 2018-06-02 DIAGNOSIS — G629 Polyneuropathy, unspecified: Secondary | ICD-10-CM | POA: Diagnosis not present

## 2018-06-07 DIAGNOSIS — G629 Polyneuropathy, unspecified: Secondary | ICD-10-CM | POA: Diagnosis not present

## 2018-06-07 DIAGNOSIS — M48061 Spinal stenosis, lumbar region without neurogenic claudication: Secondary | ICD-10-CM | POA: Diagnosis not present

## 2018-06-17 DIAGNOSIS — E119 Type 2 diabetes mellitus without complications: Secondary | ICD-10-CM | POA: Diagnosis not present

## 2018-06-17 DIAGNOSIS — E039 Hypothyroidism, unspecified: Secondary | ICD-10-CM | POA: Diagnosis not present

## 2018-06-17 DIAGNOSIS — E559 Vitamin D deficiency, unspecified: Secondary | ICD-10-CM | POA: Diagnosis not present

## 2018-06-17 DIAGNOSIS — I10 Essential (primary) hypertension: Secondary | ICD-10-CM | POA: Diagnosis not present

## 2018-06-17 DIAGNOSIS — M5116 Intervertebral disc disorders with radiculopathy, lumbar region: Secondary | ICD-10-CM | POA: Diagnosis not present

## 2018-06-17 DIAGNOSIS — E7800 Pure hypercholesterolemia, unspecified: Secondary | ICD-10-CM | POA: Diagnosis not present

## 2018-06-21 DIAGNOSIS — R899 Unspecified abnormal finding in specimens from other organs, systems and tissues: Secondary | ICD-10-CM | POA: Diagnosis not present

## 2018-07-18 ENCOUNTER — Other Ambulatory Visit: Payer: Self-pay | Admitting: Neurosurgery

## 2018-07-18 DIAGNOSIS — M48061 Spinal stenosis, lumbar region without neurogenic claudication: Secondary | ICD-10-CM

## 2018-07-26 DIAGNOSIS — M48061 Spinal stenosis, lumbar region without neurogenic claudication: Secondary | ICD-10-CM | POA: Diagnosis not present

## 2018-07-27 DIAGNOSIS — M48061 Spinal stenosis, lumbar region without neurogenic claudication: Secondary | ICD-10-CM | POA: Diagnosis not present

## 2018-07-27 DIAGNOSIS — M5127 Other intervertebral disc displacement, lumbosacral region: Secondary | ICD-10-CM | POA: Diagnosis not present

## 2018-08-03 ENCOUNTER — Other Ambulatory Visit: Payer: Self-pay

## 2018-08-03 ENCOUNTER — Ambulatory Visit
Admission: RE | Admit: 2018-08-03 | Discharge: 2018-08-03 | Disposition: A | Discharge status: Home / Self Care | Payer: Medicare HMO | Source: Ambulatory Visit | Attending: Neurosurgery | Admitting: Neurosurgery

## 2018-08-03 DIAGNOSIS — M48061 Spinal stenosis, lumbar region without neurogenic claudication: Secondary | ICD-10-CM

## 2018-08-03 DIAGNOSIS — M549 Dorsalgia, unspecified: Secondary | ICD-10-CM | POA: Diagnosis not present

## 2018-08-03 MED ORDER — IOPAMIDOL (ISOVUE-M 200) INJECTION 41%
1.0000 mL | Freq: Once | INTRAMUSCULAR | Status: AC
  Administered 2018-08-03: 1 mL via INTRA_ARTICULAR

## 2018-08-03 MED ORDER — METHYLPREDNISOLONE ACETATE 40 MG/ML INJ SUSP (RADIOLOG
120.0000 mg | Freq: Once | INTRAMUSCULAR | Status: AC
  Administered 2018-08-03: 120 mg via INTRA_ARTICULAR

## 2018-08-18 DIAGNOSIS — M5126 Other intervertebral disc displacement, lumbar region: Secondary | ICD-10-CM | POA: Diagnosis not present

## 2018-09-29 DIAGNOSIS — E119 Type 2 diabetes mellitus without complications: Secondary | ICD-10-CM | POA: Diagnosis not present

## 2018-10-18 DIAGNOSIS — E7800 Pure hypercholesterolemia, unspecified: Secondary | ICD-10-CM | POA: Diagnosis not present

## 2018-10-18 DIAGNOSIS — M545 Low back pain: Secondary | ICD-10-CM | POA: Diagnosis not present

## 2018-10-18 DIAGNOSIS — M1990 Unspecified osteoarthritis, unspecified site: Secondary | ICD-10-CM | POA: Diagnosis not present

## 2018-10-18 DIAGNOSIS — E119 Type 2 diabetes mellitus without complications: Secondary | ICD-10-CM | POA: Diagnosis not present

## 2018-10-18 DIAGNOSIS — I10 Essential (primary) hypertension: Secondary | ICD-10-CM | POA: Diagnosis not present

## 2018-10-18 DIAGNOSIS — R69 Illness, unspecified: Secondary | ICD-10-CM | POA: Diagnosis not present

## 2018-10-18 DIAGNOSIS — E039 Hypothyroidism, unspecified: Secondary | ICD-10-CM | POA: Diagnosis not present

## 2018-12-23 DIAGNOSIS — R69 Illness, unspecified: Secondary | ICD-10-CM | POA: Diagnosis not present

## 2019-01-03 DIAGNOSIS — E119 Type 2 diabetes mellitus without complications: Secondary | ICD-10-CM | POA: Diagnosis not present

## 2019-01-12 DIAGNOSIS — E119 Type 2 diabetes mellitus without complications: Secondary | ICD-10-CM | POA: Diagnosis not present

## 2019-01-12 DIAGNOSIS — Z23 Encounter for immunization: Secondary | ICD-10-CM | POA: Diagnosis not present

## 2019-01-17 DIAGNOSIS — Z1231 Encounter for screening mammogram for malignant neoplasm of breast: Secondary | ICD-10-CM | POA: Diagnosis not present

## 2019-02-05 ENCOUNTER — Ambulatory Visit: Payer: Medicare Other | Attending: Internal Medicine

## 2019-02-05 DIAGNOSIS — Z23 Encounter for immunization: Secondary | ICD-10-CM | POA: Insufficient documentation

## 2019-02-05 NOTE — Progress Notes (Signed)
   Covid-19 Vaccination Clinic  Name:  Jennifer Boyer    MRN: [id number] DOB: 1940/12/21  02/05/2019  [name] was observed post Covid-19 immunization for 15 minutes without incidence. She was provided with Vaccine Information Sheet and instruction to access the V-Safe system.   [name] was instructed to call 911 with any severe reactions post vaccine: Marland Kitchen Difficulty breathing  . Swelling of your face and throat  . A fast heartbeat  . A bad rash all over your body  . Dizziness and weakness

## 2019-02-26 ENCOUNTER — Ambulatory Visit: Payer: Medicare HMO | Attending: Internal Medicine

## 2019-02-26 DIAGNOSIS — Z23 Encounter for immunization: Secondary | ICD-10-CM | POA: Insufficient documentation

## 2019-02-26 NOTE — Progress Notes (Signed)
   Covid-19 Vaccination Clinic  Name:  Jennifer Boyer    MRN: [id number] DOB: [birth year]  02/26/2019  Jennifer Boyer was observed post Covid-19 immunization for 15 minutes without incidence. She was provided with Vaccine Information Sheet and instruction to access the V-Safe system.   Jennifer Boyer was instructed to call 911 with any severe reactions post vaccine: Marland Kitchen Difficulty breathing  . Swelling of your face and throat  . A fast heartbeat  . A bad rash all over your body  . Dizziness and weakness    Immunizations Administered    Name Date Dose VIS Date Route   Pfizer COVID-19 Vaccine 02/26/2019 11:05 AM 0.3 mL 12/30/2018 Intramuscular   Manufacturer: [place]   Lot: [id number]   [place]: [id number]

## 2019-02-27 DIAGNOSIS — R69 Illness, unspecified: Secondary | ICD-10-CM | POA: Diagnosis not present

## 2019-02-27 DIAGNOSIS — E669 Obesity, unspecified: Secondary | ICD-10-CM | POA: Diagnosis not present

## 2019-02-27 DIAGNOSIS — E7800 Pure hypercholesterolemia, unspecified: Secondary | ICD-10-CM | POA: Diagnosis not present

## 2019-02-27 DIAGNOSIS — I10 Essential (primary) hypertension: Secondary | ICD-10-CM | POA: Diagnosis not present

## 2019-02-27 DIAGNOSIS — M545 Low back pain: Secondary | ICD-10-CM | POA: Diagnosis not present

## 2019-02-27 DIAGNOSIS — M1990 Unspecified osteoarthritis, unspecified site: Secondary | ICD-10-CM | POA: Diagnosis not present

## 2019-02-27 DIAGNOSIS — E039 Hypothyroidism, unspecified: Secondary | ICD-10-CM | POA: Diagnosis not present

## 2019-02-27 DIAGNOSIS — E119 Type 2 diabetes mellitus without complications: Secondary | ICD-10-CM | POA: Diagnosis not present

## 2019-03-13 DIAGNOSIS — E7800 Pure hypercholesterolemia, unspecified: Secondary | ICD-10-CM | POA: Diagnosis not present

## 2019-03-13 DIAGNOSIS — E1169 Type 2 diabetes mellitus with other specified complication: Secondary | ICD-10-CM | POA: Diagnosis not present

## 2019-03-13 DIAGNOSIS — M1990 Unspecified osteoarthritis, unspecified site: Secondary | ICD-10-CM | POA: Diagnosis not present

## 2019-03-13 DIAGNOSIS — E039 Hypothyroidism, unspecified: Secondary | ICD-10-CM | POA: Diagnosis not present

## 2019-03-13 DIAGNOSIS — I10 Essential (primary) hypertension: Secondary | ICD-10-CM | POA: Diagnosis not present

## 2019-03-20 DIAGNOSIS — E119 Type 2 diabetes mellitus without complications: Secondary | ICD-10-CM | POA: Diagnosis not present

## 2019-03-20 DIAGNOSIS — H2513 Age-related nuclear cataract, bilateral: Secondary | ICD-10-CM | POA: Diagnosis not present

## 2019-04-10 DIAGNOSIS — R69 Illness, unspecified: Secondary | ICD-10-CM | POA: Diagnosis not present

## 2019-05-15 DIAGNOSIS — E1169 Type 2 diabetes mellitus with other specified complication: Secondary | ICD-10-CM | POA: Diagnosis not present

## 2019-05-15 DIAGNOSIS — E039 Hypothyroidism, unspecified: Secondary | ICD-10-CM | POA: Diagnosis not present

## 2019-05-15 DIAGNOSIS — E7800 Pure hypercholesterolemia, unspecified: Secondary | ICD-10-CM | POA: Diagnosis not present

## 2019-05-15 DIAGNOSIS — I10 Essential (primary) hypertension: Secondary | ICD-10-CM | POA: Diagnosis not present

## 2019-05-15 DIAGNOSIS — M1990 Unspecified osteoarthritis, unspecified site: Secondary | ICD-10-CM | POA: Diagnosis not present

## 2019-06-21 DIAGNOSIS — N3942 Incontinence without sensory awareness: Secondary | ICD-10-CM | POA: Diagnosis not present

## 2019-06-21 DIAGNOSIS — N3946 Mixed incontinence: Secondary | ICD-10-CM | POA: Diagnosis not present

## 2019-07-04 DIAGNOSIS — E119 Type 2 diabetes mellitus without complications: Secondary | ICD-10-CM | POA: Diagnosis not present

## 2019-07-13 DIAGNOSIS — R69 Illness, unspecified: Secondary | ICD-10-CM | POA: Diagnosis not present

## 2019-07-13 DIAGNOSIS — E039 Hypothyroidism, unspecified: Secondary | ICD-10-CM | POA: Diagnosis not present

## 2019-07-13 DIAGNOSIS — E119 Type 2 diabetes mellitus without complications: Secondary | ICD-10-CM | POA: Diagnosis not present

## 2019-07-13 DIAGNOSIS — M1990 Unspecified osteoarthritis, unspecified site: Secondary | ICD-10-CM | POA: Diagnosis not present

## 2019-07-13 DIAGNOSIS — M545 Low back pain: Secondary | ICD-10-CM | POA: Diagnosis not present

## 2019-07-13 DIAGNOSIS — I10 Essential (primary) hypertension: Secondary | ICD-10-CM | POA: Diagnosis not present

## 2019-07-13 DIAGNOSIS — E7800 Pure hypercholesterolemia, unspecified: Secondary | ICD-10-CM | POA: Diagnosis not present

## 2019-07-17 DIAGNOSIS — M545 Low back pain: Secondary | ICD-10-CM | POA: Diagnosis not present

## 2019-07-17 DIAGNOSIS — E1159 Type 2 diabetes mellitus with other circulatory complications: Secondary | ICD-10-CM | POA: Diagnosis not present

## 2019-07-17 DIAGNOSIS — M1990 Unspecified osteoarthritis, unspecified site: Secondary | ICD-10-CM | POA: Diagnosis not present

## 2019-07-17 DIAGNOSIS — E039 Hypothyroidism, unspecified: Secondary | ICD-10-CM | POA: Diagnosis not present

## 2019-07-17 DIAGNOSIS — I10 Essential (primary) hypertension: Secondary | ICD-10-CM | POA: Diagnosis not present

## 2019-07-17 DIAGNOSIS — E7800 Pure hypercholesterolemia, unspecified: Secondary | ICD-10-CM | POA: Diagnosis not present

## 2019-07-17 DIAGNOSIS — E119 Type 2 diabetes mellitus without complications: Secondary | ICD-10-CM | POA: Diagnosis not present

## 2019-07-17 DIAGNOSIS — N3946 Mixed incontinence: Secondary | ICD-10-CM | POA: Diagnosis not present

## 2019-07-17 DIAGNOSIS — E669 Obesity, unspecified: Secondary | ICD-10-CM | POA: Diagnosis not present

## 2019-07-17 DIAGNOSIS — R69 Illness, unspecified: Secondary | ICD-10-CM | POA: Diagnosis not present

## 2019-08-17 DIAGNOSIS — R350 Frequency of micturition: Secondary | ICD-10-CM | POA: Diagnosis not present

## 2019-08-17 DIAGNOSIS — N3941 Urge incontinence: Secondary | ICD-10-CM | POA: Diagnosis not present

## 2019-08-23 DIAGNOSIS — N3942 Incontinence without sensory awareness: Secondary | ICD-10-CM | POA: Diagnosis not present

## 2019-08-23 DIAGNOSIS — N3946 Mixed incontinence: Secondary | ICD-10-CM | POA: Diagnosis not present

## 2019-09-05 DIAGNOSIS — I10 Essential (primary) hypertension: Secondary | ICD-10-CM | POA: Diagnosis not present

## 2019-09-05 DIAGNOSIS — E1159 Type 2 diabetes mellitus with other circulatory complications: Secondary | ICD-10-CM | POA: Diagnosis not present

## 2019-09-05 DIAGNOSIS — E1142 Type 2 diabetes mellitus with diabetic polyneuropathy: Secondary | ICD-10-CM | POA: Diagnosis not present

## 2019-09-05 DIAGNOSIS — E039 Hypothyroidism, unspecified: Secondary | ICD-10-CM | POA: Diagnosis not present

## 2019-09-05 DIAGNOSIS — K219 Gastro-esophageal reflux disease without esophagitis: Secondary | ICD-10-CM | POA: Diagnosis not present

## 2019-09-05 DIAGNOSIS — E1169 Type 2 diabetes mellitus with other specified complication: Secondary | ICD-10-CM | POA: Diagnosis not present

## 2019-09-05 DIAGNOSIS — E119 Type 2 diabetes mellitus without complications: Secondary | ICD-10-CM | POA: Diagnosis not present

## 2019-09-05 DIAGNOSIS — M1990 Unspecified osteoarthritis, unspecified site: Secondary | ICD-10-CM | POA: Diagnosis not present

## 2019-09-05 DIAGNOSIS — E7800 Pure hypercholesterolemia, unspecified: Secondary | ICD-10-CM | POA: Diagnosis not present

## 2019-09-19 ENCOUNTER — Ambulatory Visit: Payer: Medicare HMO | Attending: Internal Medicine

## 2019-09-19 DIAGNOSIS — Z23 Encounter for immunization: Secondary | ICD-10-CM

## 2019-09-19 NOTE — Progress Notes (Signed)
   Covid-19 Vaccination Clinic  Name:  [name]    MRN: [id number] DOB: [birth year]  09/19/2019  [name] was observed post Covid-19 immunization for 15 minutes without incident. She was provided with Vaccine Information Sheet and instruction to access the V-Safe system.   [name] was instructed to call 911 with any severe reactions post vaccine: Marland Kitchen Difficulty breathing  . Swelling of face and throat  . A fast heartbeat  . A bad rash all over body  . Dizziness and weakness

## 2019-09-27 DIAGNOSIS — N3941 Urge incontinence: Secondary | ICD-10-CM | POA: Diagnosis not present

## 2019-10-06 DIAGNOSIS — N3942 Incontinence without sensory awareness: Secondary | ICD-10-CM | POA: Diagnosis not present

## 2019-10-06 DIAGNOSIS — R350 Frequency of micturition: Secondary | ICD-10-CM | POA: Diagnosis not present

## 2019-10-06 DIAGNOSIS — N3946 Mixed incontinence: Secondary | ICD-10-CM | POA: Diagnosis not present

## 2019-10-09 DIAGNOSIS — E119 Type 2 diabetes mellitus without complications: Secondary | ICD-10-CM | POA: Diagnosis not present

## 2019-10-09 DIAGNOSIS — I10 Essential (primary) hypertension: Secondary | ICD-10-CM | POA: Diagnosis not present

## 2019-10-09 DIAGNOSIS — M1990 Unspecified osteoarthritis, unspecified site: Secondary | ICD-10-CM | POA: Diagnosis not present

## 2019-10-09 DIAGNOSIS — E1169 Type 2 diabetes mellitus with other specified complication: Secondary | ICD-10-CM | POA: Diagnosis not present

## 2019-10-09 DIAGNOSIS — K219 Gastro-esophageal reflux disease without esophagitis: Secondary | ICD-10-CM | POA: Diagnosis not present

## 2019-10-09 DIAGNOSIS — E7800 Pure hypercholesterolemia, unspecified: Secondary | ICD-10-CM | POA: Diagnosis not present

## 2019-10-09 DIAGNOSIS — E039 Hypothyroidism, unspecified: Secondary | ICD-10-CM | POA: Diagnosis not present

## 2019-10-09 DIAGNOSIS — E1159 Type 2 diabetes mellitus with other circulatory complications: Secondary | ICD-10-CM | POA: Diagnosis not present

## 2019-10-09 DIAGNOSIS — E1142 Type 2 diabetes mellitus with diabetic polyneuropathy: Secondary | ICD-10-CM | POA: Diagnosis not present

## 2019-10-11 IMAGING — CR DG LUMBAR SPINE 2-3V
2 series · 2 of 2 positions shown · non-contrast
Comparison: 08/01/2017 MR and 03/24/2017 radiographs

CLINICAL DATA: L5-S1 microdiscectomy.

EXAM:
LUMBAR SPINE - 2-3 VIEW

[lateral (1 of 2)]
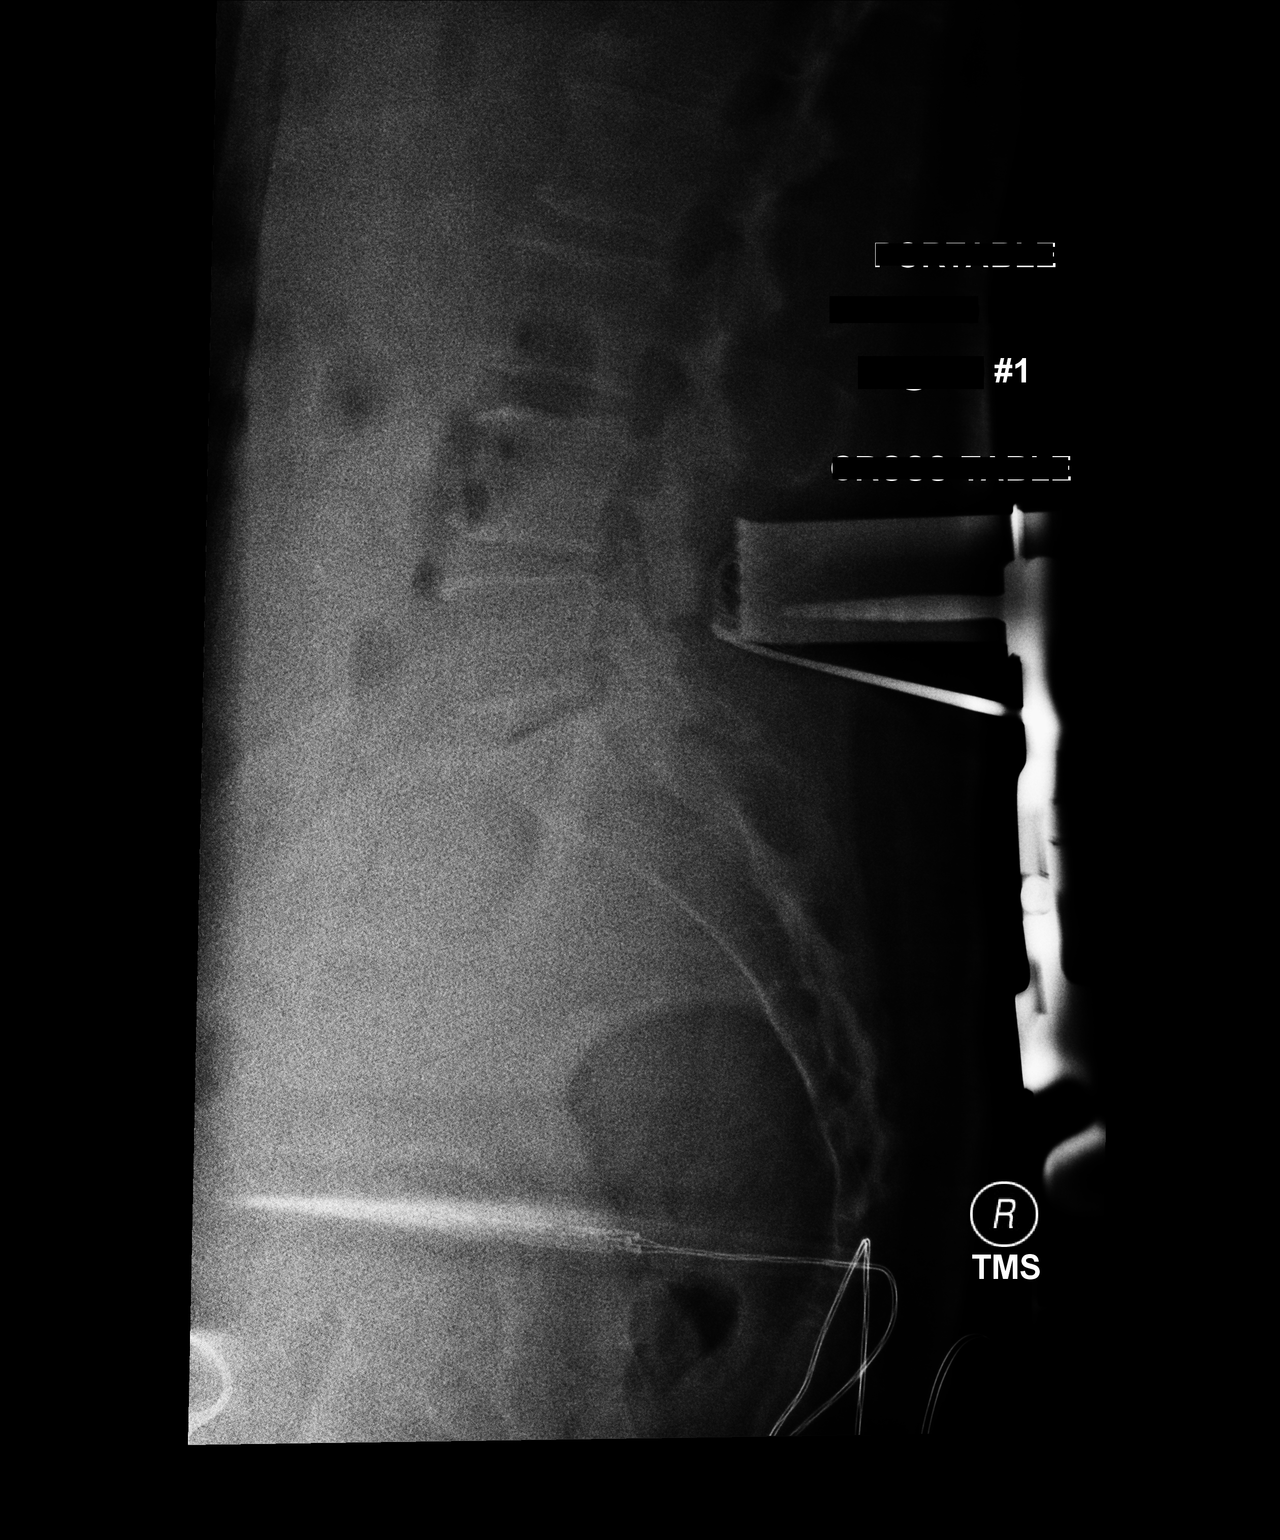

[lateral (2 of 2)]
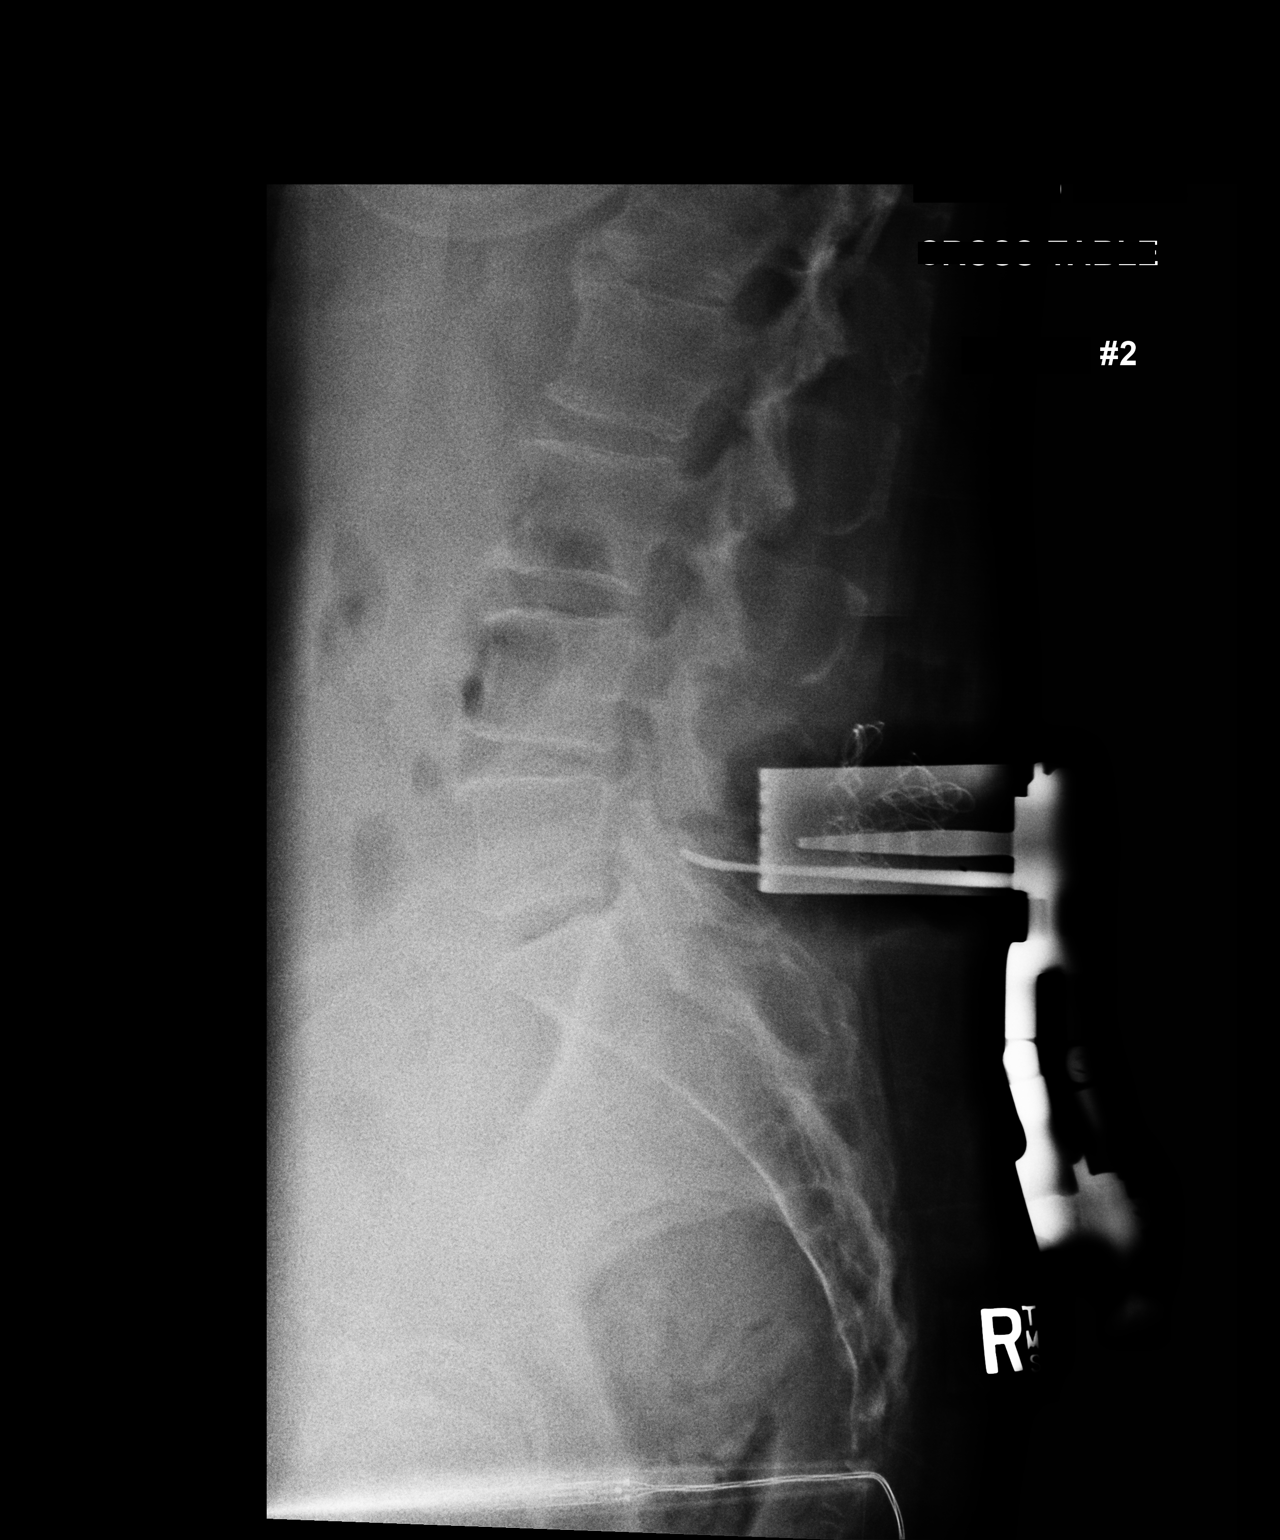

[2 of 2 positions shown; findings below may reference images not displayed]

FINDINGS: A posterior metallic probe is identified with tip between the L4 and
L5 spinous processes, directed at the L4-5 interspace.
IMPRESSION: Metallic probe between the spinous processes of L4 and L5 directed
towards the L4-5 interspace.

## 2019-10-13 DIAGNOSIS — E669 Obesity, unspecified: Secondary | ICD-10-CM | POA: Diagnosis not present

## 2019-10-13 DIAGNOSIS — M1990 Unspecified osteoarthritis, unspecified site: Secondary | ICD-10-CM | POA: Diagnosis not present

## 2019-10-13 DIAGNOSIS — E119 Type 2 diabetes mellitus without complications: Secondary | ICD-10-CM | POA: Diagnosis not present

## 2019-10-13 DIAGNOSIS — M545 Low back pain: Secondary | ICD-10-CM | POA: Diagnosis not present

## 2019-10-13 DIAGNOSIS — I10 Essential (primary) hypertension: Secondary | ICD-10-CM | POA: Diagnosis not present

## 2019-10-13 DIAGNOSIS — E7800 Pure hypercholesterolemia, unspecified: Secondary | ICD-10-CM | POA: Diagnosis not present

## 2019-10-13 DIAGNOSIS — N3946 Mixed incontinence: Secondary | ICD-10-CM | POA: Diagnosis not present

## 2019-10-13 DIAGNOSIS — R69 Illness, unspecified: Secondary | ICD-10-CM | POA: Diagnosis not present

## 2019-10-13 DIAGNOSIS — E039 Hypothyroidism, unspecified: Secondary | ICD-10-CM | POA: Diagnosis not present

## 2019-10-17 DIAGNOSIS — R69 Illness, unspecified: Secondary | ICD-10-CM | POA: Diagnosis not present

## 2019-10-17 DIAGNOSIS — N3946 Mixed incontinence: Secondary | ICD-10-CM | POA: Diagnosis not present

## 2019-10-17 DIAGNOSIS — Z23 Encounter for immunization: Secondary | ICD-10-CM | POA: Diagnosis not present

## 2019-10-17 DIAGNOSIS — I10 Essential (primary) hypertension: Secondary | ICD-10-CM | POA: Diagnosis not present

## 2019-10-17 DIAGNOSIS — E669 Obesity, unspecified: Secondary | ICD-10-CM | POA: Diagnosis not present

## 2019-10-17 DIAGNOSIS — E119 Type 2 diabetes mellitus without complications: Secondary | ICD-10-CM | POA: Diagnosis not present

## 2019-10-17 DIAGNOSIS — E039 Hypothyroidism, unspecified: Secondary | ICD-10-CM | POA: Diagnosis not present

## 2019-10-17 DIAGNOSIS — E7800 Pure hypercholesterolemia, unspecified: Secondary | ICD-10-CM | POA: Diagnosis not present

## 2019-10-17 DIAGNOSIS — M545 Low back pain: Secondary | ICD-10-CM | POA: Diagnosis not present

## 2019-10-17 DIAGNOSIS — M1990 Unspecified osteoarthritis, unspecified site: Secondary | ICD-10-CM | POA: Diagnosis not present

## 2019-12-19 DIAGNOSIS — Z23 Encounter for immunization: Secondary | ICD-10-CM | POA: Diagnosis not present

## 2019-12-19 DIAGNOSIS — R69 Illness, unspecified: Secondary | ICD-10-CM | POA: Diagnosis not present

## 2019-12-19 DIAGNOSIS — E669 Obesity, unspecified: Secondary | ICD-10-CM | POA: Diagnosis not present

## 2019-12-19 DIAGNOSIS — E119 Type 2 diabetes mellitus without complications: Secondary | ICD-10-CM | POA: Diagnosis not present

## 2019-12-19 DIAGNOSIS — E7800 Pure hypercholesterolemia, unspecified: Secondary | ICD-10-CM | POA: Diagnosis not present

## 2019-12-19 DIAGNOSIS — E039 Hypothyroidism, unspecified: Secondary | ICD-10-CM | POA: Diagnosis not present

## 2019-12-19 DIAGNOSIS — I10 Essential (primary) hypertension: Secondary | ICD-10-CM | POA: Diagnosis not present

## 2019-12-19 DIAGNOSIS — M1990 Unspecified osteoarthritis, unspecified site: Secondary | ICD-10-CM | POA: Diagnosis not present

## 2019-12-19 DIAGNOSIS — N3946 Mixed incontinence: Secondary | ICD-10-CM | POA: Diagnosis not present

## 2019-12-19 DIAGNOSIS — M5451 Vertebrogenic low back pain: Secondary | ICD-10-CM | POA: Diagnosis not present

## 2020-01-03 DIAGNOSIS — E7800 Pure hypercholesterolemia, unspecified: Secondary | ICD-10-CM | POA: Diagnosis not present

## 2020-01-03 DIAGNOSIS — E1169 Type 2 diabetes mellitus with other specified complication: Secondary | ICD-10-CM | POA: Diagnosis not present

## 2020-01-03 DIAGNOSIS — E1159 Type 2 diabetes mellitus with other circulatory complications: Secondary | ICD-10-CM | POA: Diagnosis not present

## 2020-01-03 DIAGNOSIS — I10 Essential (primary) hypertension: Secondary | ICD-10-CM | POA: Diagnosis not present

## 2020-01-03 DIAGNOSIS — E039 Hypothyroidism, unspecified: Secondary | ICD-10-CM | POA: Diagnosis not present

## 2020-01-03 DIAGNOSIS — E785 Hyperlipidemia, unspecified: Secondary | ICD-10-CM | POA: Diagnosis not present

## 2020-01-03 DIAGNOSIS — E119 Type 2 diabetes mellitus without complications: Secondary | ICD-10-CM | POA: Diagnosis not present

## 2020-01-03 DIAGNOSIS — M1990 Unspecified osteoarthritis, unspecified site: Secondary | ICD-10-CM | POA: Diagnosis not present

## 2020-01-03 DIAGNOSIS — E1142 Type 2 diabetes mellitus with diabetic polyneuropathy: Secondary | ICD-10-CM | POA: Diagnosis not present

## 2020-01-03 DIAGNOSIS — K219 Gastro-esophageal reflux disease without esophagitis: Secondary | ICD-10-CM | POA: Diagnosis not present

## 2020-01-04 DIAGNOSIS — R350 Frequency of micturition: Secondary | ICD-10-CM | POA: Diagnosis not present

## 2020-01-04 DIAGNOSIS — N3941 Urge incontinence: Secondary | ICD-10-CM | POA: Diagnosis not present

## 2020-01-04 DIAGNOSIS — N3942 Incontinence without sensory awareness: Secondary | ICD-10-CM | POA: Diagnosis not present

## 2020-01-23 DIAGNOSIS — Z1231 Encounter for screening mammogram for malignant neoplasm of breast: Secondary | ICD-10-CM | POA: Diagnosis not present

## 2020-02-15 DIAGNOSIS — E119 Type 2 diabetes mellitus without complications: Secondary | ICD-10-CM | POA: Diagnosis not present

## 2020-02-15 DIAGNOSIS — E7800 Pure hypercholesterolemia, unspecified: Secondary | ICD-10-CM | POA: Diagnosis not present

## 2020-02-15 DIAGNOSIS — K219 Gastro-esophageal reflux disease without esophagitis: Secondary | ICD-10-CM | POA: Diagnosis not present

## 2020-02-15 DIAGNOSIS — E039 Hypothyroidism, unspecified: Secondary | ICD-10-CM | POA: Diagnosis not present

## 2020-02-15 DIAGNOSIS — E785 Hyperlipidemia, unspecified: Secondary | ICD-10-CM | POA: Diagnosis not present

## 2020-02-15 DIAGNOSIS — E1142 Type 2 diabetes mellitus with diabetic polyneuropathy: Secondary | ICD-10-CM | POA: Diagnosis not present

## 2020-02-15 DIAGNOSIS — I10 Essential (primary) hypertension: Secondary | ICD-10-CM | POA: Diagnosis not present

## 2020-02-15 DIAGNOSIS — M1990 Unspecified osteoarthritis, unspecified site: Secondary | ICD-10-CM | POA: Diagnosis not present

## 2020-02-15 DIAGNOSIS — E1159 Type 2 diabetes mellitus with other circulatory complications: Secondary | ICD-10-CM | POA: Diagnosis not present

## 2020-02-15 DIAGNOSIS — E1169 Type 2 diabetes mellitus with other specified complication: Secondary | ICD-10-CM | POA: Diagnosis not present

## 2020-03-11 DIAGNOSIS — E119 Type 2 diabetes mellitus without complications: Secondary | ICD-10-CM | POA: Diagnosis not present

## 2020-03-21 DIAGNOSIS — E7800 Pure hypercholesterolemia, unspecified: Secondary | ICD-10-CM | POA: Diagnosis not present

## 2020-03-21 DIAGNOSIS — E039 Hypothyroidism, unspecified: Secondary | ICD-10-CM | POA: Diagnosis not present

## 2020-03-21 DIAGNOSIS — E119 Type 2 diabetes mellitus without complications: Secondary | ICD-10-CM | POA: Diagnosis not present

## 2020-04-02 DIAGNOSIS — E1159 Type 2 diabetes mellitus with other circulatory complications: Secondary | ICD-10-CM | POA: Diagnosis not present

## 2020-04-02 DIAGNOSIS — N3281 Overactive bladder: Secondary | ICD-10-CM | POA: Diagnosis not present

## 2020-04-02 DIAGNOSIS — E785 Hyperlipidemia, unspecified: Secondary | ICD-10-CM | POA: Diagnosis not present

## 2020-04-02 DIAGNOSIS — E039 Hypothyroidism, unspecified: Secondary | ICD-10-CM | POA: Diagnosis not present

## 2020-04-02 DIAGNOSIS — E559 Vitamin D deficiency, unspecified: Secondary | ICD-10-CM | POA: Diagnosis not present

## 2020-04-02 DIAGNOSIS — I10 Essential (primary) hypertension: Secondary | ICD-10-CM | POA: Diagnosis not present

## 2020-04-02 DIAGNOSIS — K219 Gastro-esophageal reflux disease without esophagitis: Secondary | ICD-10-CM | POA: Diagnosis not present

## 2020-04-02 DIAGNOSIS — E1142 Type 2 diabetes mellitus with diabetic polyneuropathy: Secondary | ICD-10-CM | POA: Diagnosis not present

## 2020-04-05 DIAGNOSIS — E039 Hypothyroidism, unspecified: Secondary | ICD-10-CM | POA: Diagnosis not present

## 2020-04-05 DIAGNOSIS — I10 Essential (primary) hypertension: Secondary | ICD-10-CM | POA: Diagnosis not present

## 2020-04-05 DIAGNOSIS — E785 Hyperlipidemia, unspecified: Secondary | ICD-10-CM | POA: Diagnosis not present

## 2020-04-05 DIAGNOSIS — M1990 Unspecified osteoarthritis, unspecified site: Secondary | ICD-10-CM | POA: Diagnosis not present

## 2020-04-05 DIAGNOSIS — E1159 Type 2 diabetes mellitus with other circulatory complications: Secondary | ICD-10-CM | POA: Diagnosis not present

## 2020-04-05 DIAGNOSIS — E119 Type 2 diabetes mellitus without complications: Secondary | ICD-10-CM | POA: Diagnosis not present

## 2020-04-05 DIAGNOSIS — E1142 Type 2 diabetes mellitus with diabetic polyneuropathy: Secondary | ICD-10-CM | POA: Diagnosis not present

## 2020-04-05 DIAGNOSIS — K219 Gastro-esophageal reflux disease without esophagitis: Secondary | ICD-10-CM | POA: Diagnosis not present

## 2020-04-05 DIAGNOSIS — E7800 Pure hypercholesterolemia, unspecified: Secondary | ICD-10-CM | POA: Diagnosis not present

## 2020-04-05 DIAGNOSIS — E1169 Type 2 diabetes mellitus with other specified complication: Secondary | ICD-10-CM | POA: Diagnosis not present

## 2020-05-02 DIAGNOSIS — E1159 Type 2 diabetes mellitus with other circulatory complications: Secondary | ICD-10-CM | POA: Diagnosis not present

## 2020-05-02 DIAGNOSIS — E1169 Type 2 diabetes mellitus with other specified complication: Secondary | ICD-10-CM | POA: Diagnosis not present

## 2020-05-02 DIAGNOSIS — E1142 Type 2 diabetes mellitus with diabetic polyneuropathy: Secondary | ICD-10-CM | POA: Diagnosis not present

## 2020-05-02 DIAGNOSIS — E119 Type 2 diabetes mellitus without complications: Secondary | ICD-10-CM | POA: Diagnosis not present

## 2020-05-02 DIAGNOSIS — M1990 Unspecified osteoarthritis, unspecified site: Secondary | ICD-10-CM | POA: Diagnosis not present

## 2020-05-02 DIAGNOSIS — E785 Hyperlipidemia, unspecified: Secondary | ICD-10-CM | POA: Diagnosis not present

## 2020-05-02 DIAGNOSIS — I10 Essential (primary) hypertension: Secondary | ICD-10-CM | POA: Diagnosis not present

## 2020-05-02 DIAGNOSIS — E039 Hypothyroidism, unspecified: Secondary | ICD-10-CM | POA: Diagnosis not present

## 2020-05-02 DIAGNOSIS — K219 Gastro-esophageal reflux disease without esophagitis: Secondary | ICD-10-CM | POA: Diagnosis not present

## 2020-05-02 DIAGNOSIS — E7800 Pure hypercholesterolemia, unspecified: Secondary | ICD-10-CM | POA: Diagnosis not present

## 2020-05-24 DIAGNOSIS — E119 Type 2 diabetes mellitus without complications: Secondary | ICD-10-CM | POA: Diagnosis not present

## 2020-07-08 DIAGNOSIS — E039 Hypothyroidism, unspecified: Secondary | ICD-10-CM | POA: Diagnosis not present

## 2020-07-08 DIAGNOSIS — I10 Essential (primary) hypertension: Secondary | ICD-10-CM | POA: Diagnosis not present

## 2020-07-08 DIAGNOSIS — E1159 Type 2 diabetes mellitus with other circulatory complications: Secondary | ICD-10-CM | POA: Diagnosis not present

## 2020-07-08 DIAGNOSIS — E559 Vitamin D deficiency, unspecified: Secondary | ICD-10-CM | POA: Diagnosis not present

## 2020-07-08 DIAGNOSIS — R69 Illness, unspecified: Secondary | ICD-10-CM | POA: Diagnosis not present

## 2020-07-08 DIAGNOSIS — K219 Gastro-esophageal reflux disease without esophagitis: Secondary | ICD-10-CM | POA: Diagnosis not present

## 2020-07-08 DIAGNOSIS — E1142 Type 2 diabetes mellitus with diabetic polyneuropathy: Secondary | ICD-10-CM | POA: Diagnosis not present

## 2020-07-08 DIAGNOSIS — E785 Hyperlipidemia, unspecified: Secondary | ICD-10-CM | POA: Diagnosis not present

## 2020-07-08 DIAGNOSIS — N3281 Overactive bladder: Secondary | ICD-10-CM | POA: Diagnosis not present

## 2020-07-09 DIAGNOSIS — R69 Illness, unspecified: Secondary | ICD-10-CM | POA: Diagnosis not present

## 2020-07-09 DIAGNOSIS — M1990 Unspecified osteoarthritis, unspecified site: Secondary | ICD-10-CM | POA: Diagnosis not present

## 2020-07-09 DIAGNOSIS — E039 Hypothyroidism, unspecified: Secondary | ICD-10-CM | POA: Diagnosis not present

## 2020-07-09 DIAGNOSIS — K219 Gastro-esophageal reflux disease without esophagitis: Secondary | ICD-10-CM | POA: Diagnosis not present

## 2020-07-09 DIAGNOSIS — I10 Essential (primary) hypertension: Secondary | ICD-10-CM | POA: Diagnosis not present

## 2020-07-09 DIAGNOSIS — E1142 Type 2 diabetes mellitus with diabetic polyneuropathy: Secondary | ICD-10-CM | POA: Diagnosis not present

## 2020-07-09 DIAGNOSIS — E7800 Pure hypercholesterolemia, unspecified: Secondary | ICD-10-CM | POA: Diagnosis not present

## 2020-07-09 DIAGNOSIS — E559 Vitamin D deficiency, unspecified: Secondary | ICD-10-CM | POA: Diagnosis not present

## 2020-07-09 DIAGNOSIS — N3281 Overactive bladder: Secondary | ICD-10-CM | POA: Diagnosis not present

## 2020-07-09 DIAGNOSIS — E785 Hyperlipidemia, unspecified: Secondary | ICD-10-CM | POA: Diagnosis not present

## 2020-07-09 DIAGNOSIS — E119 Type 2 diabetes mellitus without complications: Secondary | ICD-10-CM | POA: Diagnosis not present

## 2020-07-09 DIAGNOSIS — E1159 Type 2 diabetes mellitus with other circulatory complications: Secondary | ICD-10-CM | POA: Diagnosis not present

## 2020-08-01 DIAGNOSIS — E785 Hyperlipidemia, unspecified: Secondary | ICD-10-CM | POA: Diagnosis not present

## 2020-08-01 DIAGNOSIS — E7800 Pure hypercholesterolemia, unspecified: Secondary | ICD-10-CM | POA: Diagnosis not present

## 2020-08-01 DIAGNOSIS — E1169 Type 2 diabetes mellitus with other specified complication: Secondary | ICD-10-CM | POA: Diagnosis not present

## 2020-08-01 DIAGNOSIS — E039 Hypothyroidism, unspecified: Secondary | ICD-10-CM | POA: Diagnosis not present

## 2020-08-01 DIAGNOSIS — E1159 Type 2 diabetes mellitus with other circulatory complications: Secondary | ICD-10-CM | POA: Diagnosis not present

## 2020-08-01 DIAGNOSIS — I10 Essential (primary) hypertension: Secondary | ICD-10-CM | POA: Diagnosis not present

## 2020-08-01 DIAGNOSIS — E1142 Type 2 diabetes mellitus with diabetic polyneuropathy: Secondary | ICD-10-CM | POA: Diagnosis not present

## 2020-08-01 DIAGNOSIS — K219 Gastro-esophageal reflux disease without esophagitis: Secondary | ICD-10-CM | POA: Diagnosis not present

## 2020-08-01 DIAGNOSIS — E119 Type 2 diabetes mellitus without complications: Secondary | ICD-10-CM | POA: Diagnosis not present

## 2020-08-01 DIAGNOSIS — M1990 Unspecified osteoarthritis, unspecified site: Secondary | ICD-10-CM | POA: Diagnosis not present

## 2020-08-22 DIAGNOSIS — E1169 Type 2 diabetes mellitus with other specified complication: Secondary | ICD-10-CM | POA: Diagnosis not present

## 2020-08-22 DIAGNOSIS — I10 Essential (primary) hypertension: Secondary | ICD-10-CM | POA: Diagnosis not present

## 2020-08-22 DIAGNOSIS — M1990 Unspecified osteoarthritis, unspecified site: Secondary | ICD-10-CM | POA: Diagnosis not present

## 2020-08-22 DIAGNOSIS — E785 Hyperlipidemia, unspecified: Secondary | ICD-10-CM | POA: Diagnosis not present

## 2020-08-22 DIAGNOSIS — E039 Hypothyroidism, unspecified: Secondary | ICD-10-CM | POA: Diagnosis not present

## 2020-08-22 DIAGNOSIS — K219 Gastro-esophageal reflux disease without esophagitis: Secondary | ICD-10-CM | POA: Diagnosis not present

## 2020-08-22 DIAGNOSIS — E1159 Type 2 diabetes mellitus with other circulatory complications: Secondary | ICD-10-CM | POA: Diagnosis not present

## 2020-08-22 DIAGNOSIS — E119 Type 2 diabetes mellitus without complications: Secondary | ICD-10-CM | POA: Diagnosis not present

## 2020-08-22 DIAGNOSIS — E1142 Type 2 diabetes mellitus with diabetic polyneuropathy: Secondary | ICD-10-CM | POA: Diagnosis not present

## 2020-08-22 DIAGNOSIS — E7800 Pure hypercholesterolemia, unspecified: Secondary | ICD-10-CM | POA: Diagnosis not present

## 2020-09-18 DIAGNOSIS — E119 Type 2 diabetes mellitus without complications: Secondary | ICD-10-CM | POA: Diagnosis not present

## 2020-10-30 DIAGNOSIS — H25813 Combined forms of age-related cataract, bilateral: Secondary | ICD-10-CM | POA: Diagnosis not present

## 2020-10-30 DIAGNOSIS — I10 Essential (primary) hypertension: Secondary | ICD-10-CM | POA: Diagnosis not present

## 2020-10-30 DIAGNOSIS — H35033 Hypertensive retinopathy, bilateral: Secondary | ICD-10-CM | POA: Diagnosis not present

## 2020-10-30 DIAGNOSIS — E119 Type 2 diabetes mellitus without complications: Secondary | ICD-10-CM | POA: Diagnosis not present

## 2020-10-31 DIAGNOSIS — M1990 Unspecified osteoarthritis, unspecified site: Secondary | ICD-10-CM | POA: Diagnosis not present

## 2020-10-31 DIAGNOSIS — Z8249 Family history of ischemic heart disease and other diseases of the circulatory system: Secondary | ICD-10-CM | POA: Diagnosis not present

## 2020-10-31 DIAGNOSIS — E039 Hypothyroidism, unspecified: Secondary | ICD-10-CM | POA: Diagnosis not present

## 2020-10-31 DIAGNOSIS — M4800 Spinal stenosis, site unspecified: Secondary | ICD-10-CM | POA: Diagnosis not present

## 2020-10-31 DIAGNOSIS — Z604 Social exclusion and rejection: Secondary | ICD-10-CM | POA: Diagnosis not present

## 2020-10-31 DIAGNOSIS — G8929 Other chronic pain: Secondary | ICD-10-CM | POA: Diagnosis not present

## 2020-10-31 DIAGNOSIS — E1165 Type 2 diabetes mellitus with hyperglycemia: Secondary | ICD-10-CM | POA: Diagnosis not present

## 2020-10-31 DIAGNOSIS — K219 Gastro-esophageal reflux disease without esophagitis: Secondary | ICD-10-CM | POA: Diagnosis not present

## 2020-10-31 DIAGNOSIS — I10 Essential (primary) hypertension: Secondary | ICD-10-CM | POA: Diagnosis not present

## 2020-10-31 DIAGNOSIS — N3941 Urge incontinence: Secondary | ICD-10-CM | POA: Diagnosis not present

## 2020-10-31 DIAGNOSIS — E1136 Type 2 diabetes mellitus with diabetic cataract: Secondary | ICD-10-CM | POA: Diagnosis not present

## 2020-10-31 DIAGNOSIS — E1142 Type 2 diabetes mellitus with diabetic polyneuropathy: Secondary | ICD-10-CM | POA: Diagnosis not present

## 2020-10-31 DIAGNOSIS — Z7984 Long term (current) use of oral hypoglycemic drugs: Secondary | ICD-10-CM | POA: Diagnosis not present

## 2020-11-28 DIAGNOSIS — E119 Type 2 diabetes mellitus without complications: Secondary | ICD-10-CM | POA: Diagnosis not present

## 2020-11-28 DIAGNOSIS — H2511 Age-related nuclear cataract, right eye: Secondary | ICD-10-CM | POA: Diagnosis not present

## 2020-11-28 DIAGNOSIS — Z01818 Encounter for other preprocedural examination: Secondary | ICD-10-CM | POA: Diagnosis not present

## 2020-12-02 DIAGNOSIS — Z7984 Long term (current) use of oral hypoglycemic drugs: Secondary | ICD-10-CM | POA: Diagnosis not present

## 2020-12-02 DIAGNOSIS — E1165 Type 2 diabetes mellitus with hyperglycemia: Secondary | ICD-10-CM | POA: Diagnosis not present

## 2020-12-02 DIAGNOSIS — E039 Hypothyroidism, unspecified: Secondary | ICD-10-CM | POA: Diagnosis not present

## 2020-12-06 DIAGNOSIS — I10 Essential (primary) hypertension: Secondary | ICD-10-CM | POA: Diagnosis not present

## 2020-12-06 DIAGNOSIS — E1165 Type 2 diabetes mellitus with hyperglycemia: Secondary | ICD-10-CM | POA: Diagnosis not present

## 2020-12-06 DIAGNOSIS — E1159 Type 2 diabetes mellitus with other circulatory complications: Secondary | ICD-10-CM | POA: Diagnosis not present

## 2020-12-06 DIAGNOSIS — M1990 Unspecified osteoarthritis, unspecified site: Secondary | ICD-10-CM | POA: Diagnosis not present

## 2020-12-06 DIAGNOSIS — E785 Hyperlipidemia, unspecified: Secondary | ICD-10-CM | POA: Diagnosis not present

## 2020-12-06 DIAGNOSIS — E039 Hypothyroidism, unspecified: Secondary | ICD-10-CM | POA: Diagnosis not present

## 2020-12-06 DIAGNOSIS — E1142 Type 2 diabetes mellitus with diabetic polyneuropathy: Secondary | ICD-10-CM | POA: Diagnosis not present

## 2020-12-06 DIAGNOSIS — R69 Illness, unspecified: Secondary | ICD-10-CM | POA: Diagnosis not present

## 2020-12-06 DIAGNOSIS — K219 Gastro-esophageal reflux disease without esophagitis: Secondary | ICD-10-CM | POA: Diagnosis not present

## 2020-12-07 ENCOUNTER — Emergency Department (HOSPITAL_BASED_OUTPATIENT_CLINIC_OR_DEPARTMENT_OTHER)
Admission: EM | Admit: 2020-12-07 | Discharge: 2020-12-07 | Disposition: A | Discharge status: Home / Self Care | Payer: Medicare HMO | Attending: Student | Admitting: Student

## 2020-12-07 ENCOUNTER — Encounter (HOSPITAL_BASED_OUTPATIENT_CLINIC_OR_DEPARTMENT_OTHER): Payer: Self-pay | Admitting: *Deleted

## 2020-12-07 ENCOUNTER — Other Ambulatory Visit: Payer: Self-pay

## 2020-12-07 DIAGNOSIS — Z7984 Long term (current) use of oral hypoglycemic drugs: Secondary | ICD-10-CM | POA: Diagnosis not present

## 2020-12-07 DIAGNOSIS — R739 Hyperglycemia, unspecified: Secondary | ICD-10-CM

## 2020-12-07 DIAGNOSIS — E1165 Type 2 diabetes mellitus with hyperglycemia: Secondary | ICD-10-CM | POA: Diagnosis not present

## 2020-12-07 DIAGNOSIS — N3000 Acute cystitis without hematuria: Secondary | ICD-10-CM | POA: Diagnosis not present

## 2020-12-07 DIAGNOSIS — E039 Hypothyroidism, unspecified: Secondary | ICD-10-CM | POA: Diagnosis not present

## 2020-12-07 DIAGNOSIS — Z79899 Other long term (current) drug therapy: Secondary | ICD-10-CM | POA: Insufficient documentation

## 2020-12-07 DIAGNOSIS — I10 Essential (primary) hypertension: Secondary | ICD-10-CM | POA: Insufficient documentation

## 2020-12-07 LAB — CBG MONITORING, ED
Glucose-Capillary: 408 mg/dL — ABNORMAL HIGH (ref 70–99)
Glucose-Capillary: 286 mg/dL — ABNORMAL HIGH (ref 70–99)

## 2020-12-07 LAB — BASIC METABOLIC PANEL
Anion gap: 12 (ref 5–15)
BUN: 12 mg/dL (ref 8–23)
CO2: 21 mmol/L — ABNORMAL LOW (ref 22–32)
Calcium: 9.5 mg/dL (ref 8.9–10.3)
Chloride: 97 mmol/L — ABNORMAL LOW (ref 98–111)
Creatinine, Ser: 0.85 mg/dL (ref 0.44–1.00)
GFR, Estimated: >60 mL/min (ref >60)
Glucose, Bld: 431 mg/dL — ABNORMAL HIGH (ref 70–99)
Potassium: 4.2 mmol/L (ref 3.5–5.1)
Sodium: 130 mmol/L — ABNORMAL LOW (ref 135–145)

## 2020-12-07 LAB — I-STAT VENOUS BLOOD GAS, ED
Acid-base deficit: 4 mmol/L — ABNORMAL HIGH (ref 0.0–2.0)
Bicarbonate: 21 mmol/L (ref 20.0–28.0)
Calcium, Ion: 1.19 mmol/L (ref 1.15–1.40)
HCT: 34 % — ABNORMAL LOW (ref 36.0–46.0)
Hemoglobin: 11.6 g/dL — ABNORMAL LOW (ref 12.0–15.0)
O2 Saturation: 67 %
Potassium: 4.6 mmol/L (ref 3.5–5.1)
Sodium: 132 mmol/L — ABNORMAL LOW (ref 135–145)
TCO2: 22 mmol/L (ref 22–32)
pCO2, Ven: 35.2 mmHg — ABNORMAL LOW (ref 44.0–60.0)
pH, Ven: 7.383 (ref 7.250–7.430)
pO2, Ven: 35 mmHg (ref 32.0–45.0)

## 2020-12-07 LAB — URINALYSIS, ROUTINE W REFLEX MICROSCOPIC
Bilirubin Urine: NEGATIVE
Glucose, UA: >1000 mg/dL — AB
Ketones, ur: NEGATIVE mg/dL
Nitrite: POSITIVE — AB
Protein, ur: 30 mg/dL — AB
Specific Gravity, Urine: 1.038 — ABNORMAL HIGH (ref 1.005–1.030)
pH: 5 (ref 5.0–8.0)

## 2020-12-07 LAB — CBC
HCT: 32.3 % — ABNORMAL LOW (ref 36.0–46.0)
Hemoglobin: 10.3 g/dL — ABNORMAL LOW (ref 12.0–15.0)
MCH: 30.9 pg (ref 26.0–34.0)
MCHC: 31.9 g/dL (ref 30.0–36.0)
MCV: 97 fL (ref 80.0–100.0)
Platelets: 222 10*3/uL (ref 150–400)
RBC: 3.33 MIL/uL — ABNORMAL LOW (ref 3.87–5.11)
RDW: 14 % (ref 11.5–15.5)
WBC: 7.3 10*3/uL (ref 4.0–10.5)
nRBC: 0 % (ref 0.0–0.2)

## 2020-12-07 MED ORDER — SODIUM CHLORIDE 0.9 % IV BOLUS
1000.0000 mL | Freq: Once | INTRAVENOUS | Status: AC
  Administered 2020-12-07: 1000 mL via INTRAVENOUS

## 2020-12-07 MED ORDER — CEPHALEXIN 500 MG PO CAPS: 500.0000 mg | ORAL_CAPSULE | Freq: Four times a day (QID) | ORAL | 0 refills | Status: DC

## 2020-12-07 MED ORDER — CEPHALEXIN 500 MG PO CAPS: 500.0000 mg | ORAL_CAPSULE | Freq: Four times a day (QID) | ORAL | 0 refills | Status: AC

## 2020-12-07 MED ORDER — INSULIN ASPART 100 UNIT/ML IJ SOLN
4.0000 [IU] | Freq: Once | INTRAMUSCULAR | Status: AC
  Administered 2020-12-07: 4 [IU] via SUBCUTANEOUS

## 2020-12-07 NOTE — ED Provider Notes (Signed)
[facility] EMERGENCY DEPT Provider Note   CSN: [id number] Arrival date & time: 12/07/20  1602     History Chief Complaint  Patient presents with   Hyperglycemia    Jennifer Boyer is a 80 y.o. female.  HPI  80 year old female with a history of arthritis, diabetes, difficult intubation, fibromyalgia, GERD, hiatal hernia, hypertension, hypothyroidism, who presents the emergency department today for evaluation of hyperglycemia.  Patient states that she has felt fatigued and ill last week.  Her sugars have been high at home however she was seen at her doctor's office about a week ago for evaluation of her symptoms and had labs drawn.  She was called and advised that her blood sugar was elevated in the 500s and she needed to report to the ED.  She denies any fevers, chills, URI symptoms, chest pain, shortness of breath, abdominal pain, nausea, vomiting, diarrhea, urinary symptoms.  She has been compliant with her metformin as well as her injectable medication that she started earlier this week.  Family is at bedside and they deny any confusion.  Past Medical History:  Diagnosis Date   Arthritis    OA   Complication of anesthesia    pt has been told she is hard to intubate.   Diabetes mellitus without complication ([place])    Difficult intubation    Fibromyalgia    GERD (gastroesophageal reflux disease)    Headache    HX MIGRAINES  NONE IN LONG TIME   History of hiatal hernia    Hypertension    Hypothyroidism     Patient Active Problem List   Diagnosis Date Noted   HNP (herniated nucleus pulposus), lumbar 03/24/2017    Past Surgical History:  Procedure Laterality Date   BREAST SURGERY     bilateral mastectomy   CHOLECYSTECTOMY     DILATATION & CURETTAGE/HYSTEROSCOPY WITH TRUECLEAR N/A 09/30/2012   Procedure: DILATATION & CURETTAGE/HYSTEROSCOPY WITH TRUECLEAR;  Surgeon: Shon Millet II, MD;  Location: [facility];  Service: Gynecology;  Laterality: N/A;   DILATION  AND CURETTAGE OF UTERUS     uterine polyp   KNEE SURGERY     LUMBAR LAMINECTOMY/DECOMPRESSION MICRODISCECTOMY Left 03/24/2017   Procedure: LEFT LUMBAR FIVE-SACRAL ONE MICRODISCECTOMY, LEFT LUMBAR FOUR-FIVE DECOMPRESSIVE LAMINECTOMY;  Surgeon: Kary Kos, MD;  Location: [facility];  Service: Neurosurgery;  Laterality: Left;  Left L4-5 L5-S1 Laminectomy/Foraminotomy   LUMBAR LAMINECTOMY/DECOMPRESSION MICRODISCECTOMY Left 09/01/2017   Procedure: Microdiscectomy - Lumbar Five-Sacral One - left redo;  Surgeon: Kary Kos, MD;  Location: [facility];  Service: Neurosurgery;  Laterality: Left;  left   TONSILLECTOMY       OB History     Gravida  2   Para  2   Term      Preterm      AB      Living         SAB      IAB      Ectopic      Multiple      Live Births              No family history on file.  Social History   Tobacco Use   Smoking status: Never   Smokeless tobacco: Never  Vaping Use   Vaping Use: Never used  Substance Use Topics   Alcohol use: No   Drug use: No    Home Medications Prior to Admission medications   Medication Sig Start Date End Date Taking? Authorizing Provider  cephALEXin (  KEFLEX) 500 MG capsule Take 1 capsule (500 mg total) by mouth 4 (four) times daily for 7 days. 12/07/20 12/14/20 Yes Marytza Grandpre S, PA-C  acetaminophen (TYLENOL) 500 MG tablet Take 500-1,000 mg by mouth every 6 (six) hours as needed for moderate pain.    [provider]  amLODipine (NORVASC) 5 MG tablet Take 5 mg by mouth daily.    [provider]  benazepril (LOTENSIN) 20 MG tablet Take 20 mg by mouth daily.    [provider]  cholecalciferol (VITAMIN D) 1000 units tablet Take 1,000 Units by mouth once a week.     [provider]  cyclobenzaprine (FLEXERIL) 10 MG tablet Take 1 tablet (10 mg total) by mouth 3 (three) times daily as needed for muscle spasms. Patient not taking: Reported on 08/17/2017 03/25/17   Kary Kos, MD  cyclobenzaprine  (FLEXERIL) 5 MG tablet Take 5 mg by mouth 2 (two) times daily as needed for muscle spasms.    [provider]  fluticasone (FLONASE) 50 MCG/ACT nasal spray Place 2 sprays into the nose daily as needed for allergies.     [provider]  gabapentin (NEURONTIN) 300 MG capsule Take 300 mg by mouth 3 (three) times daily.    [provider]  levothyroxine (SYNTHROID, LEVOTHROID) 75 MCG tablet Take 75 mcg by mouth daily before breakfast.    [provider]  meloxicam (MOBIC) 15 MG tablet Take 1 tablet (15 mg total) by mouth daily. 06/26/13   Tuchman, Leslye Peer, DPM  metFORMIN (GLUCOPHAGE) 1000 MG tablet Take 1,000 mg by mouth 2 (two) times daily with a meal.    [provider]  OVER THE COUNTER MEDICATION Apply 1 application topically daily as needed (pain). CBD roll on    [provider]  oxybutynin (DITROPAN) 5 MG tablet Take 5 mg by mouth 2 (two) times daily.    [provider]  oxycodone (OXY-IR) 5 MG capsule Take 5 mg by mouth every 6 (six) hours as needed for pain.    [provider]  Oxycodone HCl 10 MG TABS Take 1 tablet (10 mg total) by mouth every 3 (three) hours as needed ((score 7 to 10)). 09/02/17   Kary Kos, MD  pantoprazole (PROTONIX) 40 MG tablet Take 40 mg by mouth daily.    [provider]  simvastatin (ZOCOR) 40 MG tablet Take 40 mg by mouth every evening.    [provider]  sitaGLIPtin (JANUVIA) 100 MG tablet Take 100 mg by mouth every evening.     [provider]    Allergies    Patient has no known allergies.  Review of Systems   Review of Systems  Constitutional:  Positive for fatigue. Negative for chills and fever.  HENT:  Negative for ear pain and sore throat.   Eyes:  Negative for visual disturbance.  Respiratory:  Negative for cough and shortness of breath.   Cardiovascular:  Negative for chest pain.  Gastrointestinal:  Negative for abdominal pain, constipation, diarrhea,  nausea and vomiting.  Genitourinary:  Negative for dysuria and hematuria.  Musculoskeletal:  Negative for back pain.  Skin:  Negative for rash.  Neurological:  Negative for headaches.  All other systems reviewed and are negative.  Physical Exam Updated Vital Signs BP (!) 176/74   Pulse 77   Temp 98.2 F (36.8 C) (Oral)   Resp 15   Ht 5' 6.5" (1.689 m)   Wt 99.8 kg   SpO2 100%   BMI  34.98 kg/m   Physical Exam Vitals and nursing note reviewed.  Constitutional:      General: She is not in acute distress.    Appearance: She is well-developed.  HENT:     Head: Normocephalic and atraumatic.  Eyes:     Conjunctiva/sclera: Conjunctivae normal.  Cardiovascular:     Rate and Rhythm: Normal rate and regular rhythm.     Heart sounds: Normal heart sounds. No murmur heard. Pulmonary:     Effort: Pulmonary effort is normal. No respiratory distress.     Breath sounds: Normal breath sounds. No wheezing, rhonchi or rales.  Abdominal:     General: Bowel sounds are normal.     Palpations: Abdomen is soft.     Tenderness: There is no abdominal tenderness. There is no guarding or rebound.  Musculoskeletal:        General: No swelling.     Cervical back: Neck supple.     Right lower leg: No edema.     Left lower leg: No edema.  Skin:    General: Skin is warm and dry.     Capillary Refill: Capillary refill takes less than 2 seconds.  Neurological:     Mental Status: She is alert.  Psychiatric:        Mood and Affect: Mood normal.    ED Results / Procedures / Treatments   Labs (all labs ordered are listed, but only abnormal results are displayed) Labs Reviewed  BASIC METABOLIC PANEL - Abnormal; Notable for the following components:      Result Value   Sodium 130 (*)    Chloride 97 (*)    CO2 21 (*)    Glucose, Bld 431 (*)    All other components within normal limits  CBC - Abnormal; Notable for the following components:   RBC 3.33 (*)    Hemoglobin 10.3 (*)    HCT 32.3 (*)     All other components within normal limits  URINALYSIS, ROUTINE W REFLEX MICROSCOPIC - Abnormal; Notable for the following components:   Specific Gravity, Urine 1.038 (*)    Glucose, UA >1,000 (*)    Hgb urine dipstick TRACE (*)    Protein, ur 30 (*)    Nitrite POSITIVE (*)    Leukocytes,Ua SMALL (*)    Bacteria, UA RARE (*)    All other components within normal limits  CBG MONITORING, ED - Abnormal; Notable for the following components:   Glucose-Capillary 408 (*)    All other components within normal limits  CBG MONITORING, ED - Abnormal; Notable for the following components:   Glucose-Capillary 286 (*)    All other components within normal limits  I-STAT VENOUS BLOOD GAS, ED - Abnormal; Notable for the following components:   pCO2, Ven 35.2 (*)    Acid-base deficit 4.0 (*)    Sodium 132 (*)    HCT 34.0 (*)    Hemoglobin 11.6 (*)    All other components within normal limits  URINE CULTURE  CBG MONITORING, ED    EKG None  Radiology No results found.  Procedures Procedures   Medications Ordered in ED Medications  sodium chloride 0.9 % bolus 1,000 mL (0 mLs Intravenous Stopped 12/07/20 1902)  insulin aspart (novoLOG) injection 4 Units (4 Units Subcutaneous Given 12/07/20 1749)    ED Course  I have reviewed the triage vital signs and the nursing notes.  Pertinent labs & imaging results that were available during my care of the patient were reviewed by  me and considered in my medical decision making (see chart for details).    MDM Rules/Calculators/A&P                          80 year old female here with hyperglycemia.  She was seen by her PCP earlier this week and had blood sugars elevated in the 500s.  She was sent here for further evaluation.  She is had no other systemic symptoms other than fatigue.  Reviewed/interpreted labs CBC is without leukocytosis, mild anemia is present BMP with mild pseudohyponatremia and hypochloremia, bicarb is 21, no elevated  anion gap. UA consistent with UTI. No ketonuria.  Ph wnl  Patient with hyperglycemia.  She has evidence of UTI on her work-up today.  She has no evidence of DKA.  She was given IV fluids and a little bit of insulin and her blood sugars are improving.  She will be given a prescription for antibiotics for her urinary tract infection.  We will have her follow-up with her PCP for reassessment and have her return to the ED for new or worsening symptoms.  She voices understanding of plan and reasons to return.  All questions answered.  Patient stable for discharge.  Final Clinical Impression(s) / ED Diagnoses Final diagnoses:  Hyperglycemia  Acute cystitis without hematuria    Rx / DC Orders ED Discharge Orders          Ordered    cephALEXin (KEFLEX) 500 MG capsule  4 times daily        12/07/20 1929             Bishop Dublin 12/07/20 1930    Teressa Lower, MD 12/07/20 2350

## 2020-12-07 NOTE — ED Triage Notes (Signed)
Blood sugar of 469 at 3pm.  Per patient, she has not eaten lunch.  Dr. Mina Marble advised patient to come to the ER.

## 2020-12-07 NOTE — ED Notes (Signed)
Pt d/c home per MD order. Discharge summary reviewed, verbalize understanding. Ambulatory off unit. No s/s of distress noted at discharge. Discharge home with family. This RN notified Lab to add on urine culture to previously collected UA

## 2020-12-07 NOTE — Discharge Instructions (Addendum)
You were given a prescription for antibiotics. Please take the antibiotic prescription fully.   A culture was sent of your urine today to determine if there is any bacterial growth. If the results of the culture are positive and you require an antibiotic or a change of your prescribed antibiotic you will be contacted by the hospital. If the results are negative you will not be contacted.  Please make sure that you are compliant with your diabetes medications.  Please call your doctor on Monday or go to the walk-in clinic tomorrow for reassessment and likely titration of your diabetes medications.  Please return to the emergency department for any new or worsening symptoms in the meantime including any vomiting, abdominal pain, chest pain shortness of breath or other concerns.

## 2020-12-09 DIAGNOSIS — E1165 Type 2 diabetes mellitus with hyperglycemia: Secondary | ICD-10-CM | POA: Diagnosis not present

## 2020-12-09 DIAGNOSIS — E1159 Type 2 diabetes mellitus with other circulatory complications: Secondary | ICD-10-CM | POA: Diagnosis not present

## 2020-12-09 DIAGNOSIS — I10 Essential (primary) hypertension: Secondary | ICD-10-CM | POA: Diagnosis not present

## 2020-12-09 DIAGNOSIS — E1142 Type 2 diabetes mellitus with diabetic polyneuropathy: Secondary | ICD-10-CM | POA: Diagnosis not present

## 2020-12-09 DIAGNOSIS — E785 Hyperlipidemia, unspecified: Secondary | ICD-10-CM | POA: Diagnosis not present

## 2020-12-10 LAB — URINE CULTURE: Culture: >=100000 — AB

## 2020-12-16 ENCOUNTER — Other Ambulatory Visit: Payer: Self-pay

## 2020-12-16 ENCOUNTER — Encounter (HOSPITAL_BASED_OUTPATIENT_CLINIC_OR_DEPARTMENT_OTHER): Payer: Self-pay | Admitting: Emergency Medicine

## 2020-12-16 ENCOUNTER — Emergency Department (HOSPITAL_BASED_OUTPATIENT_CLINIC_OR_DEPARTMENT_OTHER)
Admission: EM | Admit: 2020-12-16 | Discharge: 2020-12-16 | Disposition: A | Discharge status: Home / Self Care | Payer: Medicare HMO | Attending: Emergency Medicine | Admitting: Emergency Medicine

## 2020-12-16 DIAGNOSIS — Z79899 Other long term (current) drug therapy: Secondary | ICD-10-CM | POA: Insufficient documentation

## 2020-12-16 DIAGNOSIS — D72829 Elevated white blood cell count, unspecified: Secondary | ICD-10-CM | POA: Insufficient documentation

## 2020-12-16 DIAGNOSIS — R110 Nausea: Secondary | ICD-10-CM | POA: Insufficient documentation

## 2020-12-16 DIAGNOSIS — R5383 Other fatigue: Secondary | ICD-10-CM | POA: Insufficient documentation

## 2020-12-16 DIAGNOSIS — B3731 Acute candidiasis of vulva and vagina: Secondary | ICD-10-CM | POA: Diagnosis not present

## 2020-12-16 DIAGNOSIS — B379 Candidiasis, unspecified: Secondary | ICD-10-CM | POA: Diagnosis not present

## 2020-12-16 DIAGNOSIS — E1165 Type 2 diabetes mellitus with hyperglycemia: Secondary | ICD-10-CM | POA: Insufficient documentation

## 2020-12-16 DIAGNOSIS — E1142 Type 2 diabetes mellitus with diabetic polyneuropathy: Secondary | ICD-10-CM | POA: Diagnosis not present

## 2020-12-16 DIAGNOSIS — E039 Hypothyroidism, unspecified: Secondary | ICD-10-CM | POA: Insufficient documentation

## 2020-12-16 DIAGNOSIS — R739 Hyperglycemia, unspecified: Secondary | ICD-10-CM | POA: Diagnosis present

## 2020-12-16 DIAGNOSIS — I10 Essential (primary) hypertension: Secondary | ICD-10-CM | POA: Diagnosis not present

## 2020-12-16 DIAGNOSIS — Z7984 Long term (current) use of oral hypoglycemic drugs: Secondary | ICD-10-CM | POA: Insufficient documentation

## 2020-12-16 DIAGNOSIS — R300 Dysuria: Secondary | ICD-10-CM | POA: Diagnosis not present

## 2020-12-16 DIAGNOSIS — E1159 Type 2 diabetes mellitus with other circulatory complications: Secondary | ICD-10-CM | POA: Diagnosis not present

## 2020-12-16 DIAGNOSIS — E785 Hyperlipidemia, unspecified: Secondary | ICD-10-CM | POA: Diagnosis not present

## 2020-12-16 LAB — BASIC METABOLIC PANEL
Anion gap: 12 (ref 5–15)
BUN: 13 mg/dL (ref 8–23)
CO2: 22 mmol/L (ref 22–32)
Calcium: 9.3 mg/dL (ref 8.9–10.3)
Chloride: 97 mmol/L — ABNORMAL LOW (ref 98–111)
Creatinine, Ser: 0.83 mg/dL (ref 0.44–1.00)
GFR, Estimated: >60 mL/min (ref >60)
Glucose, Bld: 452 mg/dL — ABNORMAL HIGH (ref 70–99)
Potassium: 4.4 mmol/L (ref 3.5–5.1)
Sodium: 131 mmol/L — ABNORMAL LOW (ref 135–145)

## 2020-12-16 LAB — HEPATIC FUNCTION PANEL
ALT: 17 U/L (ref 0–44)
AST: 30 U/L (ref 15–41)
Albumin: 4 g/dL (ref 3.5–5.0)
Alkaline Phosphatase: 56 U/L (ref 38–126)
Bilirubin, Direct: 0.1 mg/dL (ref 0.0–0.2)
Indirect Bilirubin: 0.5 mg/dL (ref 0.3–0.9)
Total Bilirubin: 0.6 mg/dL (ref 0.3–1.2)
Total Protein: 7.4 g/dL (ref 6.5–8.1)

## 2020-12-16 LAB — I-STAT VENOUS BLOOD GAS, ED
Acid-Base Excess: 0 mmol/L (ref 0.0–2.0)
Bicarbonate: 23 mmol/L (ref 20.0–28.0)
Calcium, Ion: 1.06 mmol/L — ABNORMAL LOW (ref 1.15–1.40)
HCT: 33 % — ABNORMAL LOW (ref 36.0–46.0)
Hemoglobin: 11.2 g/dL — ABNORMAL LOW (ref 12.0–15.0)
O2 Saturation: 85 %
Potassium: 4.5 mmol/L (ref 3.5–5.1)
Sodium: 133 mmol/L — ABNORMAL LOW (ref 135–145)
TCO2: 24 mmol/L (ref 22–32)
pCO2, Ven: 30.8 mmHg — ABNORMAL LOW (ref 44.0–60.0)
pH, Ven: 7.482 — ABNORMAL HIGH (ref 7.250–7.430)
pO2, Ven: 46 mmHg — ABNORMAL HIGH (ref 32.0–45.0)

## 2020-12-16 LAB — CBC
HCT: 32.3 % — ABNORMAL LOW (ref 36.0–46.0)
Hemoglobin: 10.3 g/dL — ABNORMAL LOW (ref 12.0–15.0)
MCH: 30.8 pg (ref 26.0–34.0)
MCHC: 31.9 g/dL (ref 30.0–36.0)
MCV: 96.7 fL (ref 80.0–100.0)
Platelets: 223 10*3/uL (ref 150–400)
RBC: 3.34 MIL/uL — ABNORMAL LOW (ref 3.87–5.11)
RDW: 14 % (ref 11.5–15.5)
WBC: 5.4 10*3/uL (ref 4.0–10.5)
nRBC: 0 % (ref 0.0–0.2)

## 2020-12-16 LAB — CBG MONITORING, ED
Glucose-Capillary: 403 mg/dL — ABNORMAL HIGH (ref 70–99)
Glucose-Capillary: 336 mg/dL — ABNORMAL HIGH (ref 70–99)
Glucose-Capillary: 290 mg/dL — ABNORMAL HIGH (ref 70–99)

## 2020-12-16 LAB — URINALYSIS, ROUTINE W REFLEX MICROSCOPIC
Bilirubin Urine: NEGATIVE
Glucose, UA: >1000 mg/dL — AB
Ketones, ur: 40 mg/dL — AB
Nitrite: NEGATIVE
Specific Gravity, Urine: 1.035 — ABNORMAL HIGH (ref 1.005–1.030)
pH: 5.5 (ref 5.0–8.0)

## 2020-12-16 MED ORDER — INSULIN ASPART 100 UNIT/ML IJ SOLN
4.0000 [IU] | Freq: Once | INTRAMUSCULAR | Status: AC
  Administered 2020-12-16: 16:00:00 4 [IU] via INTRAVENOUS

## 2020-12-16 MED ORDER — INSULIN PEN NEEDLE 29G X 8MM MISC: 1.0000 | Freq: Once | 0 refills | Status: AC

## 2020-12-16 MED ORDER — FLUCONAZOLE 150 MG PO TABS: 150.0000 mg | ORAL_TABLET | Freq: Once | ORAL | 0 refills | Status: AC

## 2020-12-16 MED ORDER — "INSULIN SYRINGE 27G X 1/2"" 1 ML MISC": 1.0000 | Freq: Once | 0 refills | Status: AC

## 2020-12-16 MED ORDER — SODIUM CHLORIDE 0.9 % IV BOLUS
1000.0000 mL | Freq: Once | INTRAVENOUS | Status: AC
  Administered 2020-12-16: 16:00:00 1000 mL via INTRAVENOUS

## 2020-12-16 NOTE — Discharge Instructions (Signed)
Your history, exam, work-up today are consistent with hyperglycemia likely related to you not being able take your insulin not having the needles for the last few days.  We are able to get your glucose heading in the right direction under 300 after some fluids and insulin.  I wrote you prescription for some insulin needle pens as well as some syringe needles in case you have a vial instead.  I spoke with pharmacy who agreed with the single Diflucan dose however they did recommend you holding your simvastatin for 2 days.  Please follow-up with your primary doctor.  If any symptoms change or worsen, please return to the nearest emergency department.

## 2020-12-16 NOTE — ED Triage Notes (Signed)
Pt is a diabetic. Reports blood sugar of "high" on her meter x 1 week. Pt c/o fatigue.

## 2020-12-16 NOTE — ED Notes (Signed)
Patient verbalizes understanding of discharge instructions. Opportunity for questioning and answers were provided. Patient discharged from ED.  °

## 2020-12-16 NOTE — ED Provider Notes (Signed)
[facility] EMERGENC[facility] DEPT Provider Note   CSN: [id number] Arrival date & time: 12/16/20  1326     History Chief Complaint  Patient presents with   Hyperglycemia    Jennifer Boyer is a 80 y.o. female.  The history is provided by the patient, the spouse, a relative and medical records. No language interpreter was used.  Hyperglycemia Blood sugar level PTA:  >400 Severity:  Moderate Onset quality:  Gradual Duration:  5 days Timing:  Intermittent Progression:  Waxing and waning Chronicity:  Recurrent Diabetes status:  Controlled with insulin Context: noncompliance (out of insulin needles)   Relieved by:  Nothing Ineffective treatments:  None tried Associated symptoms: dysuria, fatigue and nausea   Associated symptoms: no abdominal pain, no altered mental status, no chest pain, no confusion, no dehydration, no diaphoresis, no fever, no increased appetite, no malaise, no polyuria, no shortness of breath, no vomiting, no weakness and no weight change       Past Medical History:  Diagnosis Date   Arthritis    OA   Complication of anesthesia    pt has been told she is hard to intubate.   Diabetes mellitus without complication ([place])    Difficult intubation    Fibromyalgia    GERD (gastroesophageal reflux disease)    Headache    HX MIGRAINES  NONE IN LONG TIME   History of hiatal hernia    Hypertension    Hypothyroidism     Patient Active Problem List   Diagnosis Date Noted   HNP (herniated nucleus pulposus), lumbar 03/24/2017    Past Surgical History:  Procedure Laterality Date   BREAST SURGERY     bilateral mastectomy   CHOLECYSTECTOMY     DILATATION & CURETTAGE/HYSTEROSCOPY WITH TRUECLEAR N/A 09/30/2012   Procedure: DILATATION & CURETTAGE/HYSTEROSCOPY WITH TRUECLEAR;  Surgeon: Shon Millet II, MD;  Location: [facility];  Service: Gynecology;  Laterality: N/A;   DILATION AND CURETTAGE OF UTERUS     uterine polyp   KNEE SURGERY     LUMBAR  LAMINECTOMY/DECOMPRESSION MICRODISCECTOMY Left 03/24/2017   Procedure: LEFT LUMBAR FIVE-SACRAL ONE MICRODISCECTOMY, LEFT LUMBAR FOUR-FIVE DECOMPRESSIVE LAMINECTOMY;  Surgeon: Kary Kos, MD;  Location: [facility];  Service: Neurosurgery;  Laterality: Left;  Left L4-5 L5-S1 Laminectomy/Foraminotomy   LUMBAR LAMINECTOMY/DECOMPRESSION MICRODISCECTOMY Left 09/01/2017   Procedure: Microdiscectomy - Lumbar Five-Sacral One - left redo;  Surgeon: Kary Kos, MD;  Location: [facility];  Service: Neurosurgery;  Laterality: Left;  left   TONSILLECTOMY       OB History     Gravida  2   Para  2   Term      Preterm      AB      Living         SAB      IAB      Ectopic      Multiple      Live Births              No family history on file.  Social History   Tobacco Use   Smoking status: Never   Smokeless tobacco: Never  Vaping Use   Vaping Use: Never used  Substance Use Topics   Alcohol use: No   Drug use: No    Home Medications Prior to Admission medications   Medication Sig Start Date End Date Taking? Authorizing Provider  acetaminophen (TYLENOL) 500 MG tablet Take 500-1,000 mg by mouth every 6 (six) hours as needed for moderate pain.  [provider]  amLODipine (NORVASC) 5 MG tablet Take 5 mg by mouth daily.    [provider]  benazepril (LOTENSIN) 20 MG tablet Take 20 mg by mouth daily.    [provider]  cholecalciferol (VITAMIN D) 1000 units tablet Take 1,000 Units by mouth once a week.     [provider]  cyclobenzaprine (FLEXERIL) 10 MG tablet Take 1 tablet (10 mg total) by mouth 3 (three) times daily as needed for muscle spasms. Patient not taking: Reported on 08/17/2017 03/25/17   Kary Kos, MD  cyclobenzaprine (FLEXERIL) 5 MG tablet Take 5 mg by mouth 2 (two) times daily as needed for muscle spasms.    [provider]  fluticasone (FLONASE) 50 MCG/ACT nasal spray Place 2 sprays into the nose daily as needed for allergies.      [provider]  gabapentin (NEURONTIN) 300 MG capsule Take 300 mg by mouth 3 (three) times daily.    [provider]  levothyroxine (SYNTHROID, LEVOTHROID) 75 MCG tablet Take 75 mcg by mouth daily before breakfast.    [provider]  meloxicam (MOBIC) 15 MG tablet Take 1 tablet (15 mg total) by mouth daily. 06/26/13   Tuchman, Leslye Peer, DPM  metFORMIN (GLUCOPHAGE) 1000 MG tablet Take 1,000 mg by mouth 2 (two) times daily with a meal.    [provider]  OVER THE COUNTER MEDICATION Apply 1 application topically daily as needed (pain). CBD roll on    [provider]  oxybutynin (DITROPAN) 5 MG tablet Take 5 mg by mouth 2 (two) times daily.    [provider]  oxycodone (OXY-IR) 5 MG capsule Take 5 mg by mouth every 6 (six) hours as needed for pain.    [provider]  Oxycodone HCl 10 MG TABS Take 1 tablet (10 mg total) by mouth every 3 (three) hours as needed ((score 7 to 10)). 09/02/17   Kary Kos, MD  pantoprazole (PROTONIX) 40 MG tablet Take 40 mg by mouth daily.    [provider]  simvastatin (ZOCOR) 40 MG tablet Take 40 mg by mouth every evening.    [provider]  sitaGLIPtin (JANUVIA) 100 MG tablet Take 100 mg by mouth every evening.     [provider]    Allergies    Patient has no known allergies.  Review of Systems   Review of Systems  Constitutional:  Positive for fatigue. Negative for chills, diaphoresis and fever.  HENT:  Negative for congestion.   Eyes:  Negative for visual disturbance.  Respiratory:  Negative for cough, chest tightness and shortness of breath.   Cardiovascular:  Negative for chest pain.  Gastrointestinal:  Positive for nausea. Negative for abdominal pain, constipation, diarrhea and vomiting.  Endocrine: Negative for polyuria.  Genitourinary:  Positive for dysuria. Negative for flank pain and frequency.       Some groin irritation  Musculoskeletal:  Negative for  back pain and neck pain.  Neurological:  Negative for weakness and headaches.  Psychiatric/Behavioral:  Negative for agitation and confusion.   All other systems reviewed and are negative.  Physical Exam Updated Vital Signs BP (!) 144/69 (BP Location: Right Arm)   Pulse 74   Temp 98.1 F (36.7 C)   Resp 20   Ht 5' 6.5" (1.689 m)   Wt 103.4 kg   SpO2 100%   BMI 36.25 kg/m   Physical Exam Vitals and nursing note reviewed.  Constitutional:      General:  She is not in acute distress.    Appearance: She is well-developed. She is not ill-appearing, toxic-appearing or diaphoretic.  HENT:     Head: Normocephalic and atraumatic.     Nose: Nose normal. No congestion or rhinorrhea.     Mouth/Throat:     Mouth: Mucous membranes are moist.  Eyes:     Extraocular Movements: Extraocular movements intact.     Conjunctiva/sclera: Conjunctivae normal.     Pupils: Pupils are equal, round, and reactive to light.  Cardiovascular:     Rate and Rhythm: Normal rate and regular rhythm.     Heart sounds: No murmur heard. Pulmonary:     Effort: Pulmonary effort is normal. No respiratory distress.     Breath sounds: Normal breath sounds. No wheezing, rhonchi or rales.  Abdominal:     General: Abdomen is flat.     Palpations: Abdomen is soft.     Tenderness: There is no abdominal tenderness. There is no right CVA tenderness, guarding or rebound.  Musculoskeletal:        General: No swelling or tenderness.     Cervical back: Neck supple. No tenderness.  Skin:    General: Skin is warm and dry.     Capillary Refill: Capillary refill takes less than 2 seconds.     Findings: No erythema or rash.  Neurological:     General: No focal deficit present.     Mental Status: She is alert. Mental status is at baseline.     Sensory: No sensory deficit.     Motor: No weakness.  Psychiatric:        Mood and Affect: Mood normal.    ED Results / Procedures / Treatments   Labs (all labs ordered are  listed, but only abnormal results are displayed) Labs Reviewed  BASIC METABOLIC PANEL - Abnormal; Notable for the following components:      Result Value   Sodium 131 (*)    Chloride 97 (*)    Glucose, Bld 452 (*)    All other components within normal limits  CBC - Abnormal; Notable for the following components:   RBC 3.34 (*)    Hemoglobin 10.3 (*)    HCT 32.3 (*)    All other components within normal limits  URINALYSIS, ROUTINE W REFLEX MICROSCOPIC - Abnormal; Notable for the following components:   Specific Gravity, Urine 1.035 (*)    Glucose, UA >1,000 (*)    Hgb urine dipstick SMALL (*)    Ketones, ur 40 (*)    Protein, ur TRACE (*)    Leukocytes,Ua MODERATE (*)    All other components within normal limits  CBG MONITORING, ED - Abnormal; Notable for the following components:   Glucose-Capillary 403 (*)    All other components within normal limits  I-STAT VENOUS BLOOD GAS, ED - Abnormal; Notable for the following components:   pH, Ven 7.482 (*)    pCO2, Ven 30.8 (*)    pO2, Ven 46.0 (*)    Sodium 133 (*)    Calcium, Ion 1.06 (*)    HCT 33.0 (*)    Hemoglobin 11.2 (*)    All other components within normal limits  CBG MONITORING, ED - Abnormal; Notable for the following components:   Glucose-Capillary 336 (*)    All other components within normal limits  CBG MONITORING, ED - Abnormal; Notable for the following components:   Glucose-Capillary 290 (*)    All other components within normal limits  URINE CULTURE  HEPATIC FUNCTION PANEL  CBG MONITORING, ED    EKG None  Radiology No results found.  Procedures Procedures   Medications Ordered in ED Medications  sodium chloride 0.9 % bolus 1,000 mL (0 mLs Intravenous Stopped 12/16/20 1759)  insulin aspart (novoLOG) injection 4 Units (4 Units Intravenous Given 12/16/20 1610)    ED Course  I have reviewed the triage vital signs and the nursing notes.  Pertinent labs & imaging results that were available during my  care of the patient were reviewed by me and considered in my medical decision making (see chart for details).    MDM Rules/Calculators/A&P                           Jennifer Boyer is a 80 y.o. female with a past medical history significant for GERD, fibromyalgia, hypothyroidism, hypertension, diabetes, and recent urinary tract infection who presents with elevated glucose, fatigue, and some groin irritation.  Patient reports that since Thanksgiving, she has run out of insulin needles and has not been able to manage her glucose at home.  She reports her glucoses were staying "high" for the last few days and her PCP has already ordered her more needles.  They told her to come in for evaluation given the elevated glucose on her monitor.  She otherwise denies any new pains including no pain in her chest, abdomen, or back.  She denies any vomiting but does report some nausea with her fatigue.  She denies any cough, ingestion, or URI symptoms.  Denies any dysuria but does report she has some groin irritation.  Denies any constipation or diarrhea or other complaints.  On exam, she has normal gait.  Lungs clear and chest nontender.  Abdomen nontender.  Patient moving all extremities.  Patient is at her mental status baseline she reports otherwise feels well.  Just feeling tired.  Glucose was over 400 on arrival and other blood work was ordered.  Patient was seen a week and a half ago for similar elevated glucoses but was found to have UTI at the time.  She reports that she has taken antibiotics and although she has some irritation she denies full dysuria.  Patient will have repeat labs and urinalysis.  Patient urinalysis shows resolution of nitrites but still has some leukocytes.  No bacteria was reported but she does have some yeast.  Suspect yeast infection.  Will send culture to look for any recurrent UTI however we will treat with medication for the yeast infection.  With her glucose similar to prior, we  will give fluids and 4 units of insulin as they did last time which got her under better control.  Patient would like to go home when she can and follow-up with her PCP for further glucose management.  Given her otherwise well appearance, if her blood work is reassuring and her glucose is improving, anticipate discharge home.  Patient's glucose is under 300 and she was feeling much better.  She would like to go home.  I discussion with her and we will give her prescription for insulin needles to last through the night and she will recheck her sugar later.  She will also take a dose Diflucan.  I spoke to pharmacy who agrees with the Diflucan but recommends having her hold her simvastatin for 2 days.  Patient was told of this.  Patient will follow-up with PCP and understood return precautions.  She no other questions or concerns and  was discharged in good condition.  Final Clinical Impression(s) / ED Diagnoses Final diagnoses:  Hyperglycemia  Yeast infection    Rx / DC Orders ED Discharge Orders          Ordered    Insulin Syringe 27G X 1/2" 1 ML MISC   Once        12/16/20 1811    fluconazole (DIFLUCAN) 150 MG tablet   Once        12/16/20 1811    Insulin Pen Needle 29G X 8MM MISC   Once        12/16/20 1816           Clinical Impression: 1. Hyperglycemia   2. Yeast infection     Disposition: Discharge  Condition: Good  I have discussed the results, Dx and Tx plan with the pt(& family if present). He/she/they expressed understanding and agree(s) with the plan. Discharge instructions discussed at great length. Strict return precautions discussed and pt &/or family have verbalized understanding of the instructions. No further questions at time of discharge.    New Prescriptions   FLUCONAZOLE (DIFLUCAN) 150 MG TABLET    Take 1 tablet (150 mg total) by mouth once for 1 dose.   INSULIN PEN NEEDLE 29G X 8MM MISC    1 Device by Does not apply route once for 1 dose.   INSULIN  SYRINGE 27G X 1/2" 1 ML MISC    1 Device by Does not apply route once for 1 dose.    Follow Up: Vernie Shanks, MD [place] [address] [facility] [place] [address] [phone]       Filmore Molyneux, Gwenyth Allegra, MD 12/16/20 [phone]

## 2020-12-17 DIAGNOSIS — K219 Gastro-esophageal reflux disease without esophagitis: Secondary | ICD-10-CM | POA: Diagnosis not present

## 2020-12-17 DIAGNOSIS — E039 Hypothyroidism, unspecified: Secondary | ICD-10-CM | POA: Diagnosis not present

## 2020-12-17 DIAGNOSIS — E1159 Type 2 diabetes mellitus with other circulatory complications: Secondary | ICD-10-CM | POA: Diagnosis not present

## 2020-12-17 DIAGNOSIS — E785 Hyperlipidemia, unspecified: Secondary | ICD-10-CM | POA: Diagnosis not present

## 2020-12-17 DIAGNOSIS — E1142 Type 2 diabetes mellitus with diabetic polyneuropathy: Secondary | ICD-10-CM | POA: Diagnosis not present

## 2020-12-17 DIAGNOSIS — E1165 Type 2 diabetes mellitus with hyperglycemia: Secondary | ICD-10-CM | POA: Diagnosis not present

## 2020-12-17 DIAGNOSIS — I10 Essential (primary) hypertension: Secondary | ICD-10-CM | POA: Diagnosis not present

## 2020-12-17 DIAGNOSIS — M1990 Unspecified osteoarthritis, unspecified site: Secondary | ICD-10-CM | POA: Diagnosis not present

## 2020-12-17 DIAGNOSIS — E1169 Type 2 diabetes mellitus with other specified complication: Secondary | ICD-10-CM | POA: Diagnosis not present

## 2020-12-17 LAB — URINE CULTURE: Culture: <10000 — AB

## 2020-12-19 DIAGNOSIS — E1165 Type 2 diabetes mellitus with hyperglycemia: Secondary | ICD-10-CM | POA: Diagnosis not present

## 2020-12-19 DIAGNOSIS — R300 Dysuria: Secondary | ICD-10-CM | POA: Diagnosis not present

## 2020-12-20 DIAGNOSIS — E119 Type 2 diabetes mellitus without complications: Secondary | ICD-10-CM | POA: Diagnosis not present

## 2021-01-09 DIAGNOSIS — E038 Other specified hypothyroidism: Secondary | ICD-10-CM | POA: Diagnosis not present

## 2021-01-09 DIAGNOSIS — E1169 Type 2 diabetes mellitus with other specified complication: Secondary | ICD-10-CM | POA: Diagnosis not present

## 2021-01-09 DIAGNOSIS — Z6835 Body mass index (BMI) 35.0-35.9, adult: Secondary | ICD-10-CM | POA: Diagnosis not present

## 2021-01-09 DIAGNOSIS — E785 Hyperlipidemia, unspecified: Secondary | ICD-10-CM | POA: Diagnosis not present

## 2021-01-09 DIAGNOSIS — Z794 Long term (current) use of insulin: Secondary | ICD-10-CM | POA: Diagnosis not present

## 2021-01-09 DIAGNOSIS — I10 Essential (primary) hypertension: Secondary | ICD-10-CM | POA: Diagnosis not present

## 2021-01-09 DIAGNOSIS — E1165 Type 2 diabetes mellitus with hyperglycemia: Secondary | ICD-10-CM | POA: Diagnosis not present

## 2021-01-09 DIAGNOSIS — G629 Polyneuropathy, unspecified: Secondary | ICD-10-CM | POA: Diagnosis not present

## 2021-01-28 DIAGNOSIS — Z1231 Encounter for screening mammogram for malignant neoplasm of breast: Secondary | ICD-10-CM | POA: Diagnosis not present

## 2021-02-21 DIAGNOSIS — E785 Hyperlipidemia, unspecified: Secondary | ICD-10-CM | POA: Diagnosis not present

## 2021-02-21 DIAGNOSIS — E039 Hypothyroidism, unspecified: Secondary | ICD-10-CM | POA: Diagnosis not present

## 2021-02-21 DIAGNOSIS — E1165 Type 2 diabetes mellitus with hyperglycemia: Secondary | ICD-10-CM | POA: Diagnosis not present

## 2021-02-21 DIAGNOSIS — E6609 Other obesity due to excess calories: Secondary | ICD-10-CM | POA: Diagnosis not present

## 2021-02-21 DIAGNOSIS — Z6833 Body mass index (BMI) 33.0-33.9, adult: Secondary | ICD-10-CM | POA: Diagnosis not present

## 2021-02-21 DIAGNOSIS — I10 Essential (primary) hypertension: Secondary | ICD-10-CM | POA: Diagnosis not present

## 2021-02-21 DIAGNOSIS — R69 Illness, unspecified: Secondary | ICD-10-CM | POA: Diagnosis not present

## 2021-02-21 DIAGNOSIS — N3281 Overactive bladder: Secondary | ICD-10-CM | POA: Diagnosis not present

## 2021-02-21 DIAGNOSIS — E1142 Type 2 diabetes mellitus with diabetic polyneuropathy: Secondary | ICD-10-CM | POA: Diagnosis not present

## 2021-02-21 DIAGNOSIS — E1159 Type 2 diabetes mellitus with other circulatory complications: Secondary | ICD-10-CM | POA: Diagnosis not present

## 2021-03-13 DIAGNOSIS — E119 Type 2 diabetes mellitus without complications: Secondary | ICD-10-CM | POA: Diagnosis not present

## 2021-05-08 DIAGNOSIS — Z6833 Body mass index (BMI) 33.0-33.9, adult: Secondary | ICD-10-CM | POA: Diagnosis not present

## 2021-05-08 DIAGNOSIS — E785 Hyperlipidemia, unspecified: Secondary | ICD-10-CM | POA: Diagnosis not present

## 2021-05-08 DIAGNOSIS — E6609 Other obesity due to excess calories: Secondary | ICD-10-CM | POA: Diagnosis not present

## 2021-05-08 DIAGNOSIS — E1159 Type 2 diabetes mellitus with other circulatory complications: Secondary | ICD-10-CM | POA: Diagnosis not present

## 2021-05-08 DIAGNOSIS — E1142 Type 2 diabetes mellitus with diabetic polyneuropathy: Secondary | ICD-10-CM | POA: Diagnosis not present

## 2021-05-08 DIAGNOSIS — E1165 Type 2 diabetes mellitus with hyperglycemia: Secondary | ICD-10-CM | POA: Diagnosis not present

## 2021-05-08 DIAGNOSIS — N3281 Overactive bladder: Secondary | ICD-10-CM | POA: Diagnosis not present

## 2021-05-08 DIAGNOSIS — R69 Illness, unspecified: Secondary | ICD-10-CM | POA: Diagnosis not present

## 2021-05-08 DIAGNOSIS — E039 Hypothyroidism, unspecified: Secondary | ICD-10-CM | POA: Diagnosis not present

## 2021-05-08 DIAGNOSIS — I10 Essential (primary) hypertension: Secondary | ICD-10-CM | POA: Diagnosis not present

## 2021-05-19 DIAGNOSIS — Z794 Long term (current) use of insulin: Secondary | ICD-10-CM | POA: Diagnosis not present

## 2021-05-19 DIAGNOSIS — I10 Essential (primary) hypertension: Secondary | ICD-10-CM | POA: Diagnosis not present

## 2021-05-19 DIAGNOSIS — E785 Hyperlipidemia, unspecified: Secondary | ICD-10-CM | POA: Diagnosis not present

## 2021-05-19 DIAGNOSIS — E1169 Type 2 diabetes mellitus with other specified complication: Secondary | ICD-10-CM | POA: Diagnosis not present

## 2021-05-19 DIAGNOSIS — E1165 Type 2 diabetes mellitus with hyperglycemia: Secondary | ICD-10-CM | POA: Diagnosis not present

## 2021-05-19 DIAGNOSIS — Z6835 Body mass index (BMI) 35.0-35.9, adult: Secondary | ICD-10-CM | POA: Diagnosis not present

## 2021-05-19 DIAGNOSIS — E038 Other specified hypothyroidism: Secondary | ICD-10-CM | POA: Diagnosis not present

## 2021-05-19 DIAGNOSIS — G629 Polyneuropathy, unspecified: Secondary | ICD-10-CM | POA: Diagnosis not present

## 2021-06-04 DIAGNOSIS — E119 Type 2 diabetes mellitus without complications: Secondary | ICD-10-CM | POA: Diagnosis not present

## 2021-06-18 DIAGNOSIS — Z794 Long term (current) use of insulin: Secondary | ICD-10-CM | POA: Diagnosis not present

## 2021-06-18 DIAGNOSIS — E1165 Type 2 diabetes mellitus with hyperglycemia: Secondary | ICD-10-CM | POA: Diagnosis not present

## 2021-06-18 DIAGNOSIS — E1169 Type 2 diabetes mellitus with other specified complication: Secondary | ICD-10-CM | POA: Diagnosis not present

## 2021-06-18 DIAGNOSIS — G629 Polyneuropathy, unspecified: Secondary | ICD-10-CM | POA: Diagnosis not present

## 2021-06-18 DIAGNOSIS — Z6835 Body mass index (BMI) 35.0-35.9, adult: Secondary | ICD-10-CM | POA: Diagnosis not present

## 2021-06-18 DIAGNOSIS — I10 Essential (primary) hypertension: Secondary | ICD-10-CM | POA: Diagnosis not present

## 2021-06-18 DIAGNOSIS — E785 Hyperlipidemia, unspecified: Secondary | ICD-10-CM | POA: Diagnosis not present

## 2021-06-18 DIAGNOSIS — E038 Other specified hypothyroidism: Secondary | ICD-10-CM | POA: Diagnosis not present

## 2021-06-23 DIAGNOSIS — Z6833 Body mass index (BMI) 33.0-33.9, adult: Secondary | ICD-10-CM | POA: Diagnosis not present

## 2021-06-23 DIAGNOSIS — N3281 Overactive bladder: Secondary | ICD-10-CM | POA: Diagnosis not present

## 2021-06-23 DIAGNOSIS — I10 Essential (primary) hypertension: Secondary | ICD-10-CM | POA: Diagnosis not present

## 2021-06-23 DIAGNOSIS — E1165 Type 2 diabetes mellitus with hyperglycemia: Secondary | ICD-10-CM | POA: Diagnosis not present

## 2021-06-23 DIAGNOSIS — E6609 Other obesity due to excess calories: Secondary | ICD-10-CM | POA: Diagnosis not present

## 2021-06-23 DIAGNOSIS — E039 Hypothyroidism, unspecified: Secondary | ICD-10-CM | POA: Diagnosis not present

## 2021-06-23 DIAGNOSIS — E1169 Type 2 diabetes mellitus with other specified complication: Secondary | ICD-10-CM | POA: Diagnosis not present

## 2021-06-23 DIAGNOSIS — R69 Illness, unspecified: Secondary | ICD-10-CM | POA: Diagnosis not present

## 2021-06-23 DIAGNOSIS — E1142 Type 2 diabetes mellitus with diabetic polyneuropathy: Secondary | ICD-10-CM | POA: Diagnosis not present

## 2021-06-23 DIAGNOSIS — E785 Hyperlipidemia, unspecified: Secondary | ICD-10-CM | POA: Diagnosis not present

## 2021-07-29 ENCOUNTER — Inpatient Hospital Stay (HOSPITAL_COMMUNITY)
Admission: EM | Admit: 2021-07-29 | Discharge: 2021-08-05 | DRG: 637 | Disposition: A | Discharge status: Skilled Nursing Facility | Payer: Medicare HMO | Attending: Internal Medicine | Admitting: Internal Medicine

## 2021-07-29 ENCOUNTER — Emergency Department (HOSPITAL_COMMUNITY): Payer: Medicare HMO

## 2021-07-29 ENCOUNTER — Encounter (HOSPITAL_COMMUNITY): Payer: Self-pay

## 2021-07-29 ENCOUNTER — Other Ambulatory Visit: Payer: Self-pay

## 2021-07-29 ENCOUNTER — Inpatient Hospital Stay (HOSPITAL_COMMUNITY): Payer: Medicare HMO

## 2021-07-29 DIAGNOSIS — E876 Hypokalemia: Secondary | ICD-10-CM | POA: Diagnosis not present

## 2021-07-29 DIAGNOSIS — I639 Cerebral infarction, unspecified: Secondary | ICD-10-CM | POA: Diagnosis not present

## 2021-07-29 DIAGNOSIS — Z79899 Other long term (current) drug therapy: Secondary | ICD-10-CM | POA: Diagnosis not present

## 2021-07-29 DIAGNOSIS — R69 Illness, unspecified: Secondary | ICD-10-CM | POA: Diagnosis not present

## 2021-07-29 DIAGNOSIS — F09 Unspecified mental disorder due to known physiological condition: Secondary | ICD-10-CM | POA: Diagnosis not present

## 2021-07-29 DIAGNOSIS — Z91148 Patient's other noncompliance with medication regimen for other reason: Secondary | ICD-10-CM

## 2021-07-29 DIAGNOSIS — R4182 Altered mental status, unspecified: Secondary | ICD-10-CM | POA: Diagnosis not present

## 2021-07-29 DIAGNOSIS — G9340 Encephalopathy, unspecified: Secondary | ICD-10-CM

## 2021-07-29 DIAGNOSIS — D539 Nutritional anemia, unspecified: Secondary | ICD-10-CM | POA: Diagnosis not present

## 2021-07-29 DIAGNOSIS — R739 Hyperglycemia, unspecified: Secondary | ICD-10-CM | POA: Diagnosis not present

## 2021-07-29 DIAGNOSIS — Z91199 Patient's noncompliance with other medical treatment and regimen due to unspecified reason: Secondary | ICD-10-CM | POA: Diagnosis not present

## 2021-07-29 DIAGNOSIS — I10 Essential (primary) hypertension: Secondary | ICD-10-CM

## 2021-07-29 DIAGNOSIS — N179 Acute kidney failure, unspecified: Secondary | ICD-10-CM | POA: Diagnosis not present

## 2021-07-29 DIAGNOSIS — G9341 Metabolic encephalopathy: Secondary | ICD-10-CM | POA: Diagnosis present

## 2021-07-29 DIAGNOSIS — E1110 Type 2 diabetes mellitus with ketoacidosis without coma: Principal | ICD-10-CM

## 2021-07-29 DIAGNOSIS — E785 Hyperlipidemia, unspecified: Secondary | ICD-10-CM

## 2021-07-29 DIAGNOSIS — Z79890 Hormone replacement therapy (postmenopausal): Secondary | ICD-10-CM

## 2021-07-29 DIAGNOSIS — I159 Secondary hypertension, unspecified: Secondary | ICD-10-CM

## 2021-07-29 DIAGNOSIS — D649 Anemia, unspecified: Secondary | ICD-10-CM

## 2021-07-29 DIAGNOSIS — E875 Hyperkalemia: Secondary | ICD-10-CM | POA: Diagnosis not present

## 2021-07-29 DIAGNOSIS — Z7984 Long term (current) use of oral hypoglycemic drugs: Secondary | ICD-10-CM

## 2021-07-29 DIAGNOSIS — R000 Tachycardia, unspecified: Secondary | ICD-10-CM | POA: Diagnosis not present

## 2021-07-29 DIAGNOSIS — R9431 Abnormal electrocardiogram [ECG] [EKG]: Secondary | ICD-10-CM | POA: Diagnosis not present

## 2021-07-29 DIAGNOSIS — Z743 Need for continuous supervision: Secondary | ICD-10-CM | POA: Diagnosis not present

## 2021-07-29 DIAGNOSIS — R0689 Other abnormalities of breathing: Secondary | ICD-10-CM | POA: Diagnosis not present

## 2021-07-29 DIAGNOSIS — E039 Hypothyroidism, unspecified: Secondary | ICD-10-CM | POA: Diagnosis not present

## 2021-07-29 DIAGNOSIS — Z791 Long term (current) use of non-steroidal anti-inflammatories (NSAID): Secondary | ICD-10-CM

## 2021-07-29 DIAGNOSIS — R350 Frequency of micturition: Secondary | ICD-10-CM | POA: Diagnosis present

## 2021-07-29 DIAGNOSIS — F32A Depression, unspecified: Secondary | ICD-10-CM | POA: Diagnosis present

## 2021-07-29 HISTORY — DX: Hyperlipidemia, unspecified: E78.5

## 2021-07-29 LAB — CBG MONITORING, ED
Glucose-Capillary: 524 mg/dL (ref 70–99)
Glucose-Capillary: 466 mg/dL — ABNORMAL HIGH (ref 70–99)
Glucose-Capillary: 435 mg/dL — ABNORMAL HIGH (ref 70–99)
Glucose-Capillary: 310 mg/dL — ABNORMAL HIGH (ref 70–99)
Glucose-Capillary: 253 mg/dL — ABNORMAL HIGH (ref 70–99)
Glucose-Capillary: 236 mg/dL — ABNORMAL HIGH (ref 70–99)
Glucose-Capillary: 223 mg/dL — ABNORMAL HIGH (ref 70–99)

## 2021-07-29 LAB — PHOSPHORUS: Phosphorus: 1.6 mg/dL — ABNORMAL LOW (ref 2.5–4.6)

## 2021-07-29 LAB — I-STAT VENOUS BLOOD GAS, ED
Acid-base deficit: 24 mmol/L — ABNORMAL HIGH (ref 0.0–2.0)
Acid-base deficit: 25 mmol/L — ABNORMAL HIGH (ref 0.0–2.0)
Bicarbonate: 4.4 mmol/L — ABNORMAL LOW (ref 20.0–28.0)
Bicarbonate: 4.5 mmol/L — ABNORMAL LOW (ref 20.0–28.0)
Calcium, Ion: 1.02 mmol/L — ABNORMAL LOW (ref 1.15–1.40)
Calcium, Ion: 1.15 mmol/L (ref 1.15–1.40)
HCT: 43 % (ref 36.0–46.0)
HCT: 45 % (ref 36.0–46.0)
Hemoglobin: 14.6 g/dL (ref 12.0–15.0)
Hemoglobin: 15.3 g/dL — ABNORMAL HIGH (ref 12.0–15.0)
O2 Saturation: 84 %
O2 Saturation: 99 %
Potassium: 4.9 mmol/L (ref 3.5–5.1)
Potassium: >8.5 mmol/L (ref 3.5–5.1)
Sodium: 133 mmol/L — ABNORMAL LOW (ref 135–145)
Sodium: 139 mmol/L (ref 135–145)
TCO2: <5 mmol/L — ABNORMAL LOW (ref 22–32)
TCO2: <5 mmol/L — ABNORMAL LOW (ref 22–32)
pCO2, Ven: 15.9 mmHg — CL (ref 44–60)
pCO2, Ven: 16.4 mmHg — CL (ref 44–60)
pH, Ven: 7.036 — CL (ref 7.25–7.43)
pH, Ven: 7.059 — CL (ref 7.25–7.43)
pO2, Ven: 198 mmHg — ABNORMAL HIGH (ref 32–45)
pO2, Ven: 67 mmHg — ABNORMAL HIGH (ref 32–45)

## 2021-07-29 LAB — OSMOLALITY: Osmolality: 354 mOsm/kg (ref 275–295)

## 2021-07-29 LAB — BASIC METABOLIC PANEL
Anion gap: 20 — ABNORMAL HIGH (ref 5–15)
BUN: 43 mg/dL — ABNORMAL HIGH (ref 8–23)
BUN: 36 mg/dL — ABNORMAL HIGH (ref 8–23)
CO2: <7 mmol/L — ABNORMAL LOW (ref 22–32)
CO2: 10 mmol/L — ABNORMAL LOW (ref 22–32)
Calcium: 8.7 mg/dL — ABNORMAL LOW (ref 8.9–10.3)
Calcium: 8.5 mg/dL — ABNORMAL LOW (ref 8.9–10.3)
Chloride: 105 mmol/L (ref 98–111)
Chloride: 106 mmol/L (ref 98–111)
Creatinine, Ser: 1.75 mg/dL — ABNORMAL HIGH (ref 0.44–1.00)
Creatinine, Ser: 1.33 mg/dL — ABNORMAL HIGH (ref 0.44–1.00)
GFR, Estimated: 29 mL/min — ABNORMAL LOW (ref >60)
GFR, Estimated: 40 mL/min — ABNORMAL LOW (ref >60)
Glucose, Bld: 555 mg/dL (ref 70–99)
Glucose, Bld: 156 mg/dL — ABNORMAL HIGH (ref 70–99)
Potassium: 4.9 mmol/L (ref 3.5–5.1)
Potassium: 3.4 mmol/L — ABNORMAL LOW (ref 3.5–5.1)
Sodium: 138 mmol/L (ref 135–145)
Sodium: 136 mmol/L (ref 135–145)

## 2021-07-29 LAB — URINALYSIS, ROUTINE W REFLEX MICROSCOPIC
Bilirubin Urine: NEGATIVE
Glucose, UA: >=500 mg/dL — AB
Hgb urine dipstick: NEGATIVE
Ketones, ur: 80 mg/dL — AB
Leukocytes,Ua: NEGATIVE
Nitrite: NEGATIVE
Protein, ur: 30 mg/dL — AB
Specific Gravity, Urine: 1.021 (ref 1.005–1.030)
pH: 5 (ref 5.0–8.0)

## 2021-07-29 LAB — CBC WITH DIFFERENTIAL/PLATELET
Abs Immature Granulocytes: 0.11 10*3/uL — ABNORMAL HIGH (ref 0.00–0.07)
Basophils Absolute: 0 10*3/uL (ref 0.0–0.1)
Basophils Relative: 0 %
Eosinophils Absolute: 0 10*3/uL (ref 0.0–0.5)
Eosinophils Relative: 0 %
HCT: 44.7 % (ref 36.0–46.0)
Hemoglobin: 13.3 g/dL (ref 12.0–15.0)
Immature Granulocytes: 1 %
Lymphocytes Relative: 8 %
Lymphs Abs: 0.7 10*3/uL (ref 0.7–4.0)
MCH: 32 pg (ref 26.0–34.0)
MCHC: 29.8 g/dL — ABNORMAL LOW (ref 30.0–36.0)
MCV: 107.5 fL — ABNORMAL HIGH (ref 80.0–100.0)
Monocytes Absolute: 0.5 10*3/uL (ref 0.1–1.0)
Monocytes Relative: 6 %
Neutro Abs: 7.8 10*3/uL — ABNORMAL HIGH (ref 1.7–7.7)
Neutrophils Relative %: 85 %
Platelets: 231 10*3/uL (ref 150–400)
RBC: 4.16 MIL/uL (ref 3.87–5.11)
RDW: 15 % (ref 11.5–15.5)
WBC: 9.1 10*3/uL (ref 4.0–10.5)
nRBC: 0 % (ref 0.0–0.2)

## 2021-07-29 LAB — BETA-HYDROXYBUTYRIC ACID
Beta-Hydroxybutyric Acid: >8 mmol/L — ABNORMAL HIGH (ref 0.05–0.27)
Beta-Hydroxybutyric Acid: 7.02 mmol/L — ABNORMAL HIGH (ref 0.05–0.27)

## 2021-07-29 LAB — MAGNESIUM: Magnesium: 1.7 mg/dL (ref 1.7–2.4)

## 2021-07-29 LAB — VITAMIN B12: Vitamin B-12: 456 pg/mL (ref 180–914)

## 2021-07-29 LAB — FOLATE: Folate: 13.9 ng/mL (ref >5.9)

## 2021-07-29 LAB — TSH: TSH: 2.073 u[IU]/mL (ref 0.350–4.500)

## 2021-07-29 MED ORDER — ENOXAPARIN SODIUM 30 MG/0.3ML IJ SOSY
30.0000 mg | PREFILLED_SYRINGE | INTRAMUSCULAR | Status: DC
Start: 2021-07-29 — End: 2021-07-30
  Administered 2021-07-29: 30 mg via SUBCUTANEOUS
  Filled 2021-07-29: qty 0.3

## 2021-07-29 MED ORDER — LABETALOL HCL 5 MG/ML IV SOLN: 10.0000 mg | INTRAVENOUS | Status: DC | PRN

## 2021-07-29 MED ORDER — INSULIN REGULAR(HUMAN) IN NACL 100-0.9 UT/100ML-% IV SOLN
INTRAVENOUS | Status: DC
  Administered 2021-07-29: 11.5 [IU]/h via INTRAVENOUS
  Administered 2021-07-30: 2.6 [IU]/h via INTRAVENOUS
  Filled 2021-07-29 (×2): qty 100

## 2021-07-29 MED ORDER — LEVOTHYROXINE SODIUM 75 MCG PO TABS
75.0000 ug | ORAL_TABLET | Freq: Every day | ORAL | Status: DC
  Administered 2021-07-30 – 2021-08-05 (×7): 75 ug via ORAL
  Filled 2021-07-29 (×7): qty 1

## 2021-07-29 MED ORDER — DEXTROSE 50 % IV SOLN: 0.0000 mL | INTRAVENOUS | Status: DC | PRN

## 2021-07-29 MED ORDER — DEXTROSE IN LACTATED RINGERS 5 % IV SOLN
INTRAVENOUS | Status: DC
Start: 2021-07-29 — End: 2021-07-29

## 2021-07-29 MED ORDER — DEXTROSE-NACL 5-0.9 % IV SOLN: INTRAVENOUS | Status: DC

## 2021-07-29 MED ORDER — LACTATED RINGERS IV BOLUS
20.0000 mL/kg | Freq: Once | INTRAVENOUS | Status: AC
Start: 2021-07-29 — End: 2021-07-29
  Administered 2021-07-29: 2000 mL via INTRAVENOUS

## 2021-07-29 MED ORDER — STERILE WATER FOR INJECTION IV SOLN
INTRAVENOUS | Status: DC
  Filled 2021-07-29 (×3): qty 1000

## 2021-07-29 MED ORDER — LACTATED RINGERS IV SOLN
INTRAVENOUS | Status: DC
Start: 2021-07-29 — End: 2021-07-29

## 2021-07-29 MED ORDER — HYDRALAZINE HCL 20 MG/ML IJ SOLN: 10.0000 mg | INTRAMUSCULAR | Status: DC | PRN

## 2021-07-29 NOTE — ED Notes (Signed)
Beir hugger reapplied at this time along with multiple warm blankets.

## 2021-07-29 NOTE — ED Notes (Signed)
Code Stroke Activated

## 2021-07-29 NOTE — ED Triage Notes (Signed)
Per EMS pt son reports increased AMS over the last few weeks. Excessive thirst and urination. No complaint with medications. With EMS pt was A/ox4. Pt son states she her memory has decreased.   EMS 516 CBG 151/76 100 HR  30 RR 100 RA 18LAC  1L NS in route

## 2021-07-29 NOTE — Assessment & Plan Note (Signed)
-   possibly due to poor nutritional intake - check folate and B12

## 2021-07-29 NOTE — Hospital Course (Signed)
Ms. Borgmeyer is an 81 yo female with PMH DMII, noncompliance, HTN, HLD who presented with confusion, increased urinary frequency, urinary incontinence, increased thirst, and generalized weakness.  Collateral information was provided by the patient's son who was present in the ER due to patient having confusion and unable to provide full HPI. Her son states that she is not compliant with her insulin regimen at home and does not follow a diabetic diet. He states that she had an episode of DKA about 6 months ago.  Per epic, she was seen in the ER in November 2022 and able to be treated and discharged home without requiring admission.  On this presentation, she underwent work-up in the ER.   Notable labs include:  Initial glucose 524. CO2<7, BUN 43, Creat 1.75, Beta hydroxybutyric >8, Osmo 354 UA neg nitrite, neg LE, 0-5 WBC, few bacteria CBC mostly unremarkable other than macrocytosis and probable anemia to ensue after fluids. VBG: 7.036/16 CXR negative for acute findings.   In the ER she was started on insulin drip and LR.  She is admitted for further DKA work-up and monitoring.

## 2021-07-29 NOTE — Assessment & Plan Note (Addendum)
-   initial K>8.5 - s/p IVF bolus and insulin - repeat K 4.9 - trend BMP, treat DKA and AKI

## 2021-07-29 NOTE — Progress Notes (Signed)
See my previous note. Update: Just spoke to Dr. Leonel Ramsay who informed me that CT head is negative for acute stroke.  Patient is confused but he did not appreciate any focal neurodeficit on exam.  Likely acute metabolic encephalopathy.  However, MRI can be considered.  I have ordered stat brain MRI.

## 2021-07-29 NOTE — Code Documentation (Signed)
Responded to Code Stroke called by ED RN at 2038 on pt already in ED for hyperglycemia. Code Stroke was called for aphasia, LSN originally thought to be 1947. CBG-253, NIH-0. After further review, LSN changed to prior to ED arrival (pt arrived at 41). TNK not given-NIH 0 and pt outside window. Plan MRI.

## 2021-07-29 NOTE — Consult Note (Signed)
Neurology Consultation Reason for Consult: Altered mental status Referring Physician: Pearline Cables  CC: Altered mental status  History is obtained from: Patient  HPI: Jennifer Boyer is a 81 y.o. female who was brought in for increasing altered mental status and polydipsia per her son for the past several weeks.  Earlier today, she was repeating herself, and acting more confused and so brought her into the emergency department to be checked out.  Here she was found to be in DKA started on insulin drip.  This evening, she appeared to get slightly worse and therefore code stroke was activated.  On my exam, she is mildly encephalopathic with some perseveration, but no clear focal neurological deficit.  She states that she has been having difficulty speaking that since at least early this afternoon, though then when I ask her later about her trouble speaking she denies it.  LKW: Unclear tpa given?: no, unclear time of onset  Past Medical History:  Diagnosis Date   Arthritis    OA   Complication of anesthesia    pt has been told she is hard to intubate.   Diabetes mellitus without complication ([place])    Difficult intubation    Fibromyalgia    GERD (gastroesophageal reflux disease)    Headache    HX MIGRAINES  NONE IN LONG TIME   History of hiatal hernia    Hypertension    Hypothyroidism      History reviewed. No pertinent family history.   Social History:  reports that she has never smoked. She has never used smokeless tobacco. She reports that she does not drink alcohol and does not use drugs.   Exam: Current vital signs: BP (!) 119/57   Pulse 95   Temp (!) 94.5 F (34.7 C) (Oral)   Resp 15   Ht '5\' 7"'$  (1.702 m)   Wt 104.3 kg   SpO2 100%   BMI 36.02 kg/m  Vital signs in last 24 hours: Temp:  [94.5 F (34.7 C)] 94.5 F (34.7 C) (07/11 1500) Pulse Rate:  [87-106] 95 (07/11 2015) Resp:  [15-26] 15 (07/11 2015) BP: (119-170)/(56-84) 119/57 (07/11 2015) SpO2:  [98 %-100 %]  100 % (07/11 2015) Weight:  [100 kg-104.3 kg] 104.3 kg (07/11 1619)   Physical Exam  Constitutional: Appears well-developed and well-nourished.  Psych: Affect appropriate to situation Eyes: No scleral injection HENT: No OP obstruction MSK: no joint deformities.  Cardiovascular: Normal rate and regular rhythm.  Respiratory: Effort normal, non-labored breathing GI: Soft.  No distension. There is no tenderness.  Skin: WDI  Neuro: Mental Status: Patient is awake, alert, oriented to person, place, month, year, and age. She does appear mildly encephalopathic with latency of speech, but without clear aphasia.  She does have some mild perseveration. Cranial Nerves: II: Visual Fields are full. Pupils are equal, round, and reactive to light.   III,IV, VI: EOMI without ptosis or diploplia.  V: Facial sensation is symmetric to temperature VII: Facial movement is symmetric.  VIII: hearing is intact to voice X: Uvula elevates symmetrically XI: Shoulder shrug is symmetric. XII: tongue is midline without atrophy or fasciculations.  Motor: Tone is normal. Bulk is normal. 5/5 strength was present in all four extremities.  Sensory: Sensation is symmetric to light touch and temperature in the arms and legs. Cerebellar: She is slow but without ataxia on finger-nose-finger bilaterally.     I have reviewed labs in epic and the results pertinent to this consultation are: Glucose 555 Beta hydroxybutyric acid  greater than eight  I have reviewed the images obtained: CT head-negative  Impression: 81 year old female with encephalopathy in the setting of DKA.  Given the duration of her encephalopathy, I do think an MRI would not be unreasonable, but if this is negative then I would not attribute her encephalopathy to her underlying medical condition.  Following blood sugar correction and DKA, mental status improvement can lag behind correction of the blood glucose.  Recommendations: 1) MRI brain 2)  neurology will follow if MRI is positive. 3) if MRI is negative and she does not show expected improvement, please reconsult neurology.   Roland Rack, MD [facility] [phone]  If 7pm- 7am, please page neurology on call as listed in [place].

## 2021-07-29 NOTE — Assessment & Plan Note (Signed)
-   check TSH - follow up med rec; will resume synthroid 75 for now, but confirm correct dose after med rec complete

## 2021-07-29 NOTE — ED Notes (Signed)
NS asked to activate Code Stroke on pt.

## 2021-07-29 NOTE — ED Notes (Signed)
Multiple warm blankets applied to the patient and applied around head and shoulders at this time

## 2021-07-29 NOTE — Assessment & Plan Note (Signed)
-   await med rec - use labetalol and hydralazine PRN for now

## 2021-07-29 NOTE — Progress Notes (Signed)
Overnight event  Received secure chat message from ED RN [place]: "Hey the patient is starting to complain of generalized pain and now she is throwing up."  I called the ED immediately and spoke to RN directly.  He informed me that the patient was moaning and grunting, breathing heavy, and had 1 episode of vomiting.  Per RN, patient is not able to tell where she is having pain.  Vital signs: Blood pressure 119/57, heart rate 93, SPO2 96% on room air, respiratory rate 19.  He told me that he thought that patient was having a neurologic event but when I asked him what deficits the patient was having, he did not have an answer for me.  He told me to come assess the patient at bedside.  I told him I would come do that immediately but if he was concerned that the patient was having a stroke, I requested him to immediate activate code stroke.  Unfortunately, RN declined to activate code stroke and told me I could come to the bedside to assess the patient and he then hung up the phone.  I was not able to get any additional information from the RN regarding this patient.  I was on the fourth floor of the hospital and immediately came down to the ED to assess the patient.  Upon my arrival, ED physician was at bedside and had already assessed the patient.  I was informed by ED physician that patient was alert and oriented and did not have any obvious focal neurodeficit on exam.  However, code stroke was activated as RN told her that he was concerned about patient having aphasia earlier.  Patient has been taken down for stat CT head.  Appreciate assistance from ED physician.

## 2021-07-29 NOTE — Assessment & Plan Note (Signed)
-   Suspected due to noncompliance as reported per son.  Patient too confused to endorse insulin regimen or diabetic regimen at home.  Son also states that she does not follow diabetic diet.  Given negative urinalysis and CXR, no obvious infection contributing to etiology but will continue to monitor - Discontinue LR and start on bicarb drip given severe acidosis (pH 7.036) - Continue insulin drip - Continue trending BMP, mag, Phos - Check A1c - RD consult placed for diet education - Okay for CLD as having no nausea or vomiting

## 2021-07-29 NOTE — ED Notes (Signed)
Secretary asked to page provider regarding current CBG

## 2021-07-29 NOTE — H&P (Signed)
History and Physical    Jennifer Boyer  FGH:[id number]  DOB: 05/24/1940  DOA: 07/29/2021  PCP: Vernie Shanks, MD (Inactive) Patient coming from: Home  Chief Complaint: AMS, increased thirst, increased urinary frequency  HPI:  Jennifer Boyer is an 81 yo female with PMH DMII, noncompliance, HTN, HLD who presented with confusion, increased urinary frequency, urinary incontinence, increased thirst, and generalized weakness.  Collateral information was provided by the patient's son who was present in the ER due to patient having confusion and unable to provide full HPI. Her son states that she is not compliant with her insulin regimen at home and does not follow a diabetic diet. He states that she had an episode of DKA about 6 months ago.  Per epic, she was seen in the ER in November 2022 and able to be treated and discharged home without requiring admission.  On this presentation, she underwent work-up in the ER.   Notable labs include:  Initial glucose 524. CO2<7, BUN 43, Creat 1.75, Beta hydroxybutyric >8, Osmo 354 UA neg nitrite, neg LE, 0-5 WBC, few bacteria CBC mostly unremarkable other than macrocytosis and probable anemia to ensue after fluids. VBG: 7.036/16 CXR negative for acute findings.   In the ER she was started on insulin drip and LR.  She is admitted for further DKA work-up and monitoring.  I have personally briefly reviewed patient's old medical records in [facility] and discussed patient with the ER provider when appropriate/indicated.  Assessment and Plan: * DKA, type 2 ([place]) - Suspected due to noncompliance as reported per son.  Patient too confused to endorse insulin regimen or diabetic regimen at home.  Son also states that she does not follow diabetic diet.  Given negative urinalysis and CXR, no obvious infection contributing to etiology but will continue to monitor - Discontinue LR and start on bicarb drip given severe acidosis (pH 7.036) - Continue insulin  drip - Continue trending BMP, mag, Phos - Check A1c - RD consult placed for diet education - Okay for CLD as having no nausea or vomiting  AKI (acute kidney injury) ([place]) - baseline creatinine ~ 0.8 - patient presents with increase in creat >0.3 mg/dL above baseline, creat increase >1.5x baseline presumed to have occurred within past 7 days PTA - presumed prerenal from poor intake and increased urinary frequency - continue IVF - trend BMP   Hyperkalemia - initial K>8.5 - s/p IVF bolus and insulin - repeat K 4.9 - trend BMP, treat DKA and AKI  Hypothyroidism - check TSH - follow up med rec; will resume synthroid 75 for now, but confirm correct dose after med rec complete   HTN (hypertension) - await med rec - use labetalol and hydralazine PRN for now  HLD (hyperlipidemia) - await med rec - can resume statin when recovered some  Macrocytic anemia - possibly due to poor nutritional intake - check folate and B12    Code Status:     Code Status: Full Code  DVT Prophylaxis:   enoxaparin (LOVENOX) injection 30 mg Start: 07/29/21 1700   Anticipated disposition is to: Home  History: Past Medical History:  Diagnosis Date   Arthritis    OA   Complication of anesthesia    pt has been told she is hard to intubate.   Diabetes mellitus without complication ([place])    Difficult intubation    Fibromyalgia    GERD (gastroesophageal reflux disease)    Headache    HX MIGRAINES  NONE IN  LONG TIME   History of hiatal hernia    Hypertension    Hypothyroidism     Past Surgical History:  Procedure Laterality Date   BREAST SURGERY     bilateral mastectomy   CHOLECYSTECTOMY     DILATATION & CURETTAGE/HYSTEROSCOPY WITH TRUECLEAR N/A 09/30/2012   Procedure: DILATATION & CURETTAGE/HYSTEROSCOPY WITH TRUECLEAR;  Surgeon: Shon Millet II, MD;  Location: [facility];  Service: Gynecology;  Laterality: N/A;   DILATION AND CURETTAGE OF UTERUS     uterine polyp   KNEE SURGERY     LUMBAR  LAMINECTOMY/DECOMPRESSION MICRODISCECTOMY Left 03/24/2017   Procedure: LEFT LUMBAR FIVE-SACRAL ONE MICRODISCECTOMY, LEFT LUMBAR FOUR-FIVE DECOMPRESSIVE LAMINECTOMY;  Surgeon: Kary Kos, MD;  Location: [facility];  Service: Neurosurgery;  Laterality: Left;  Left L4-5 L5-S1 Laminectomy/Foraminotomy   LUMBAR LAMINECTOMY/DECOMPRESSION MICRODISCECTOMY Left 09/01/2017   Procedure: Microdiscectomy - Lumbar Five-Sacral One - left redo;  Surgeon: Kary Kos, MD;  Location: [facility];  Service: Neurosurgery;  Laterality: Left;  left   TONSILLECTOMY       reports that she has never smoked. She has never used smokeless tobacco. She reports that she does not drink alcohol and does not use drugs.  No Known Allergies  History reviewed. No pertinent family history.  Home Medications: Prior to Admission medications   Medication Sig Start Date End Date Taking? Authorizing Provider  acetaminophen (TYLENOL) 500 MG tablet Take 500-1,000 mg by mouth every 6 (six) hours as needed for moderate pain.    [provider]  amLODipine (NORVASC) 5 MG tablet Take 5 mg by mouth daily.    [provider]  benazepril (LOTENSIN) 20 MG tablet Take 20 mg by mouth daily.    [provider]  cholecalciferol (VITAMIN D) 1000 units tablet Take 1,000 Units by mouth once a week.     [provider]  cyclobenzaprine (FLEXERIL) 10 MG tablet Take 1 tablet (10 mg total) by mouth 3 (three) times daily as needed for muscle spasms. Patient not taking: Reported on 08/17/2017 03/25/17   Kary Kos, MD  cyclobenzaprine (FLEXERIL) 5 MG tablet Take 5 mg by mouth 2 (two) times daily as needed for muscle spasms.    [provider]  fluticasone (FLONASE) 50 MCG/ACT nasal spray Place 2 sprays into the nose daily as needed for allergies.     [provider]  gabapentin (NEURONTIN) 300 MG capsule Take 300 mg by mouth 3 (three) times daily.    [provider]  levothyroxine (SYNTHROID, LEVOTHROID) 75 MCG  tablet Take 75 mcg by mouth daily before breakfast.    [provider]  meloxicam (MOBIC) 15 MG tablet Take 1 tablet (15 mg total) by mouth daily. 06/26/13   Tuchman, Leslye Peer, DPM  metFORMIN (GLUCOPHAGE) 1000 MG tablet Take 1,000 mg by mouth 2 (two) times daily with a meal.    [provider]  OVER THE COUNTER MEDICATION Apply 1 application topically daily as needed (pain). CBD roll on    [provider]  oxybutynin (DITROPAN) 5 MG tablet Take 5 mg by mouth 2 (two) times daily.    [provider]  oxycodone (OXY-IR) 5 MG capsule Take 5 mg by mouth every 6 (six) hours as needed for pain.    [provider]  Oxycodone HCl 10 MG TABS Take 1 tablet (10 mg total) by mouth every 3 (three) hours as needed ((score 7 to 10)). 09/02/17   Kary Kos, MD  pantoprazole (PROTONIX) 40 MG tablet Take 40 mg  by mouth daily.    [provider]  simvastatin (ZOCOR) 40 MG tablet Take 40 mg by mouth every evening.    [provider]  sitaGLIPtin (JANUVIA) 100 MG tablet Take 100 mg by mouth every evening.     [provider]    Review of Systems:  Review of Systems  Constitutional:  Positive for malaise/fatigue. Negative for chills and fever.  HENT: Negative.    Eyes: Negative.   Respiratory: Negative.    Cardiovascular: Negative.   Gastrointestinal:  Negative for diarrhea, nausea and vomiting.  Genitourinary:  Positive for frequency.  Neurological: Negative.   Psychiatric/Behavioral: Negative.      Physical Exam:  Vitals:   07/29/21 1433 07/29/21 1500 07/29/21 1615 07/29/21 1619  BP: (!) 170/76  (!) 151/58   Pulse: (!) 105  96   Resp: (!) 24  (!) 23   Temp:  (!) 94.5 F (34.7 C)    TempSrc:  Oral    SpO2: 100%  100%   Weight:    104.3 kg  Height:    '5\' 7"'$  (1.702 m)   Physical Exam Constitutional:      Appearance: She is obese. She is ill-appearing.     Comments: Confused, uncomfortable, ill appearing  HENT:     Head:  Normocephalic and atraumatic.     Mouth/Throat:     Mouth: Mucous membranes are dry.  Eyes:     Extraocular Movements: Extraocular movements intact.  Cardiovascular:     Rate and Rhythm: Regular rhythm. Tachycardia present.  Pulmonary:     Effort: Pulmonary effort is normal.     Breath sounds: Normal breath sounds. No wheezing.     Comments: Kussmaul breathing appreciated Abdominal:     General: Bowel sounds are normal. There is no distension.     Palpations: Abdomen is soft.     Tenderness: There is no abdominal tenderness.  Musculoskeletal:        General: No swelling. Normal range of motion.     Cervical back: Normal range of motion and neck supple.  Skin:    General: Skin is warm and dry.  Neurological:     Comments: Follows commands and moves all 4 extremities      Labs on Admission:  I have personally reviewed following labs and imaging studies Results for orders placed or performed during the hospital encounter of 07/29/21 (from the past 24 hour(s))  CBG monitoring, ED     Status: Abnormal   Collection Time: 07/29/21  2:43 PM  Result Value Ref Range   Glucose-Capillary 524 (HH) 70 - 99 mg/dL   Comment 1 Notify RN   Basic metabolic panel     Status: Abnormal   Collection Time: 07/29/21  2:53 PM  Result Value Ref Range   Sodium 138 135 - 145 mmol/L   Potassium 4.9 3.5 - 5.1 mmol/L   Chloride 105 98 - 111 mmol/L   CO2 <7 (L) 22 - 32 mmol/L   Glucose, Bld 555 (HH) 70 - 99 mg/dL   BUN 43 (H) 8 - 23 mg/dL   Creatinine, Ser 1.75 (H) 0.44 - 1.00 mg/dL   Calcium 8.7 (L) 8.9 - 10.3 mg/dL   GFR, Estimated 29 (L) >60 mL/min   Anion gap NOT CALCULATED 5 - 15  Beta-hydroxybutyric acid     Status: Abnormal   Collection Time: 07/29/21  2:53 PM  Result Value Ref Range   Beta-Hydroxybutyric Acid >8.00 (H) 0.05 - 0.27 mmol/L  CBC with  Differential (PNL)     Status: Abnormal   Collection Time: 07/29/21  2:53 PM  Result Value Ref Range   WBC 9.1 4.0 - 10.5 K/uL   RBC 4.16 3.87  - 5.11 MIL/uL   Hemoglobin 13.3 12.0 - 15.0 g/dL   HCT 44.7 36.0 - 46.0 %   MCV 107.5 (H) 80.0 - 100.0 fL   MCH 32.0 26.0 - 34.0 pg   MCHC 29.8 (L) 30.0 - 36.0 g/dL   RDW 15.0 11.5 - 15.5 %   Platelets 231 150 - 400 K/uL   nRBC 0.0 0.0 - 0.2 %   Neutrophils Relative % 85 %   Neutro Abs 7.8 (H) 1.7 - 7.7 K/uL   Lymphocytes Relative 8 %   Lymphs Abs 0.7 0.7 - 4.0 K/uL   Monocytes Relative 6 %   Monocytes Absolute 0.5 0.1 - 1.0 K/uL   Eosinophils Relative 0 %   Eosinophils Absolute 0.0 0.0 - 0.5 K/uL   Basophils Relative 0 %   Basophils Absolute 0.0 0.0 - 0.1 K/uL   Immature Granulocytes 1 %   Abs Immature Granulocytes 0.11 (H) 0.00 - 0.07 K/uL  Osmolality     Status: Abnormal   Collection Time: 07/29/21  2:53 PM  Result Value Ref Range   Osmolality 354 (HH) 275 - 295 mOsm/kg  Urinalysis, Routine w reflex microscopic Urine, Clean Catch     Status: Abnormal   Collection Time: 07/29/21  3:17 PM  Result Value Ref Range   Color, Urine YELLOW YELLOW   APPearance HAZY (A) CLEAR   Specific Gravity, Urine 1.021 1.005 - 1.030   pH 5.0 5.0 - 8.0   Glucose, UA >=500 (A) NEGATIVE mg/dL   Hgb urine dipstick NEGATIVE NEGATIVE   Bilirubin Urine NEGATIVE NEGATIVE   Ketones, ur 80 (A) NEGATIVE mg/dL   Protein, ur 30 (A) NEGATIVE mg/dL   Nitrite NEGATIVE NEGATIVE   Leukocytes,Ua NEGATIVE NEGATIVE   RBC / HPF 0-5 0 - 5 RBC/hpf   WBC, UA 0-5 0 - 5 WBC/hpf   Bacteria, UA FEW (A) NONE SEEN   Squamous Epithelial / LPF 0-5 0 - 5   Budding Yeast PRESENT   I-Stat Venous Blood Gas, ED     Status: Abnormal   Collection Time: 07/29/21  3:40 PM  Result Value Ref Range   pH, Ven 7.059 (LL) 7.25 - 7.43   pCO2, Ven 15.9 (LL) 44 - 60 mmHg   pO2, Ven 67 (H) 32 - 45 mmHg   Bicarbonate 4.5 (L) 20.0 - 28.0 mmol/L   TCO2 <5 (L) 22 - 32 mmol/L   O2 Saturation 84 %   Acid-base deficit 24.0 (H) 0.0 - 2.0 mmol/L   Sodium 133 (L) 135 - 145 mmol/L   Potassium >8.5 (HH) 3.5 - 5.1 mmol/L   Calcium, Ion 1.02  (L) 1.15 - 1.40 mmol/L   HCT 45.0 36.0 - 46.0 %   Hemoglobin 15.3 (H) 12.0 - 15.0 g/dL   Sample type VENOUS    Comment NOTIFIED PHYSICIAN   I-Stat venous blood gas, ED     Status: Abnormal   Collection Time: 07/29/21  3:50 PM  Result Value Ref Range   pH, Ven 7.036 (LL) 7.25 - 7.43   pCO2, Ven 16.4 (LL) 44 - 60 mmHg   pO2, Ven 198 (H) 32 - 45 mmHg   Bicarbonate 4.4 (L) 20.0 - 28.0 mmol/L   TCO2 <5 (L) 22 - 32 mmol/L   O2 Saturation 99 %  Acid-base deficit 25.0 (H) 0.0 - 2.0 mmol/L   Sodium 139 135 - 145 mmol/L   Potassium 4.9 3.5 - 5.1 mmol/L   Calcium, Ion 1.15 1.15 - 1.40 mmol/L   HCT 43.0 36.0 - 46.0 %   Hemoglobin 14.6 12.0 - 15.0 g/dL   Sample type VENOUS    Comment NOTIFIED PHYSICIAN      Radiological Exams on Admission: DG Chest Portable 1 View  Result Date: 07/29/2021 CLINICAL DATA:  Altered mental status EXAM: PORTABLE CHEST 1 VIEW COMPARISON:  None Available. FINDINGS: The cardiomediastinal silhouette is within normal limits. There is no focal airspace disease. There is no pleural effusion. No pneumothorax. There is no acute osseous abnormality. Thoracic spondylosis. IMPRESSION: No evidence of acute cardiopulmonary disease. Electronically Signed   By: Maurine Simmering M.D.   On: 07/29/2021 14:57   DG Chest Portable 1 View  Final Result      Consults called:     EKG: Independently reviewed. Sinus tach   Dwyane Dee, MD [facility] 07/29/2021, 5:02 PM

## 2021-07-29 NOTE — ED Provider Notes (Signed)
[facility] EMERGENCY DEPARTMENT Provider Note   CSN: [id number] Arrival date & time: 07/29/21  1422     History  No chief complaint on file.   Jennifer Boyer is a 81 y.o. female.  Pt is a 81 yo female with a pmhx significant for arthritis, diabetes, fibromyalgia, GERD, hiatal hernia, hypertension, and hypothyroidism.  Pt has not been feeling well for several days.  She has had altered mental status and excessive thirst.  Per EMS, she's been noncompliant with her meds.  Pt denies any pain.  Pt's son called EMS.       Home Medications Prior to Admission medications   Medication Sig Start Date End Date Taking? Authorizing Provider  acetaminophen (TYLENOL) 500 MG tablet Take 500-1,000 mg by mouth every 6 (six) hours as needed for moderate pain.    [provider]  amLODipine (NORVASC) 5 MG tablet Take 5 mg by mouth daily.    [provider]  benazepril (LOTENSIN) 20 MG tablet Take 20 mg by mouth daily.    [provider]  cholecalciferol (VITAMIN D) 1000 units tablet Take 1,000 Units by mouth once a week.     [provider]  cyclobenzaprine (FLEXERIL) 10 MG tablet Take 1 tablet (10 mg total) by mouth 3 (three) times daily as needed for muscle spasms. Patient not taking: Reported on 08/17/2017 03/25/17   Kary Kos, MD  cyclobenzaprine (FLEXERIL) 5 MG tablet Take 5 mg by mouth 2 (two) times daily as needed for muscle spasms.    [provider]  fluticasone (FLONASE) 50 MCG/ACT nasal spray Place 2 sprays into the nose daily as needed for allergies.     [provider]  gabapentin (NEURONTIN) 300 MG capsule Take 300 mg by mouth 3 (three) times daily.    [provider]  levothyroxine (SYNTHROID, LEVOTHROID) 75 MCG tablet Take 75 mcg by mouth daily before breakfast.    [provider]  meloxicam (MOBIC) 15 MG tablet Take 1 tablet (15 mg total) by mouth daily. 06/26/13   Tuchman, Leslye Peer, DPM   metFORMIN (GLUCOPHAGE) 1000 MG tablet Take 1,000 mg by mouth 2 (two) times daily with a meal.    [provider]  OVER THE COUNTER MEDICATION Apply 1 application topically daily as needed (pain). CBD roll on    [provider]  oxybutynin (DITROPAN) 5 MG tablet Take 5 mg by mouth 2 (two) times daily.    [provider]  oxycodone (OXY-IR) 5 MG capsule Take 5 mg by mouth every 6 (six) hours as needed for pain.    [provider]  Oxycodone HCl 10 MG TABS Take 1 tablet (10 mg total) by mouth every 3 (three) hours as needed ((score 7 to 10)). 09/02/17   Kary Kos, MD  pantoprazole (PROTONIX) 40 MG tablet Take 40 mg by mouth daily.    [provider]  simvastatin (ZOCOR) 40 MG tablet Take 40 mg by mouth every evening.    [provider]  sitaGLIPtin (JANUVIA) 100 MG tablet Take 100 mg by mouth every evening.     [provider]      Allergies    Patient has no known allergies.    Review of Systems   Review of Systems  Endocrine: Positive for polydipsia.  All other systems reviewed and are negative.   Physical Exam Updated Vital Signs BP (!) 151/58   Pulse 96   Temp (!) 94.5 F (34.7 C) (Oral)  Resp (!) 23   Ht '5\' 7"'$  (1.702 m)   Wt 104.3 kg   SpO2 100%   BMI 36.02 kg/m  Physical Exam Vitals and nursing note reviewed.  Constitutional:      General: She is in acute distress.     Appearance: She is ill-appearing.  HENT:     Head: Normocephalic and atraumatic.     Right Ear: External ear normal.     Left Ear: External ear normal.     Nose: Nose normal.     Mouth/Throat:     Mouth: Mucous membranes are dry.  Eyes:     Extraocular Movements: Extraocular movements intact.     Conjunctiva/sclera: Conjunctivae normal.     Pupils: Pupils are equal, round, and reactive to light.  Cardiovascular:     Rate and Rhythm: Tachycardia present.     Pulses: Normal pulses.     Heart sounds: Normal heart sounds.  Pulmonary:      Breath sounds: Normal breath sounds.     Comments: +kussmaul breathing Abdominal:     General: Abdomen is flat. Bowel sounds are normal.     Palpations: Abdomen is soft.  Musculoskeletal:        General: Normal range of motion.     Cervical back: Normal range of motion and neck supple.  Skin:    General: Skin is warm.     Capillary Refill: Capillary refill takes less than 2 seconds.  Neurological:     General: No focal deficit present.     Mental Status: She is alert.     Comments: Pt is slow to respond, but she is oriented.  She is following simple commands.  Psychiatric:        Mood and Affect: Mood normal.        Behavior: Behavior normal.     ED Results / Procedures / Treatments   Labs (all labs ordered are listed, but only abnormal results are displayed) Labs Reviewed  BASIC METABOLIC PANEL - Abnormal; Notable for the following components:      Result Value   CO2 <7 (*)    Glucose, Bld 555 (*)    BUN 43 (*)    Creatinine, Ser 1.75 (*)    Calcium 8.7 (*)    GFR, Estimated 29 (*)    All other components within normal limits  BETA-HYDROXYBUTYRIC ACID - Abnormal; Notable for the following components:   Beta-Hydroxybutyric Acid >8.00 (*)    All other components within normal limits  CBC WITH DIFFERENTIAL/PLATELET - Abnormal; Notable for the following components:   MCV 107.5 (*)    MCHC 29.8 (*)    Neutro Abs 7.8 (*)    Abs Immature Granulocytes 0.11 (*)    All other components within normal limits  URINALYSIS, ROUTINE W REFLEX MICROSCOPIC - Abnormal; Notable for the following components:   APPearance HAZY (*)    Glucose, UA >=500 (*)    Ketones, ur 80 (*)    Protein, ur 30 (*)    Bacteria, UA FEW (*)    All other components within normal limits  OSMOLALITY - Abnormal; Notable for the following components:   Osmolality 354 (*)    All other components within normal limits  CBG MONITORING, ED - Abnormal; Notable for the following components:   Glucose-Capillary 524  (*)    All other components within normal limits  I-STAT VENOUS BLOOD GAS, ED - Abnormal; Notable for the following components:   pH, Ven 7.059 (*)  pCO2, Ven 15.9 (*)    pO2, Ven 67 (*)    Bicarbonate 4.5 (*)    TCO2 <5 (*)    Acid-base deficit 24.0 (*)    Sodium 133 (*)    Potassium >8.5 (*)    Calcium, Ion 1.02 (*)    Hemoglobin 15.3 (*)    All other components within normal limits  I-STAT VENOUS BLOOD GAS, ED - Abnormal; Notable for the following components:   pH, Ven 7.036 (*)    pCO2, Ven 16.4 (*)    pO2, Ven 198 (*)    Bicarbonate 4.4 (*)    TCO2 <5 (*)    Acid-base deficit 25.0 (*)    All other components within normal limits  BASIC METABOLIC PANEL  BASIC METABOLIC PANEL  BASIC METABOLIC PANEL  BETA-HYDROXYBUTYRIC ACID    EKG EKG Interpretation  Date/Time:  Tuesday July 29 2021 14:37:18 EDT Ventricular Rate:  101 PR Interval:  185 QRS Duration: 90 QT Interval:  352 QTC Calculation: 457 R Axis:   -15 Text Interpretation: Sinus tachycardia Borderline left axis deviation ST elevation, consider inferior injury No significant change since last tracing Confirmed by Isla Pence [phone]) on 07/29/2021 3:49:16 PM  Radiology DG Chest Portable 1 View  Result Date: 07/29/2021 CLINICAL DATA:  Altered mental status EXAM: PORTABLE CHEST 1 VIEW COMPARISON:  None Available. FINDINGS: The cardiomediastinal silhouette is within normal limits. There is no focal airspace disease. There is no pleural effusion. No pneumothorax. There is no acute osseous abnormality. Thoracic spondylosis. IMPRESSION: No evidence of acute cardiopulmonary disease. Electronically Signed   By: Maurine Simmering M.D.   On: 07/29/2021 14:57    Procedures Procedures    Medications Ordered in ED Medications  lactated ringers infusion ( Intravenous New Bag/Given 07/29/21 1616)  dextrose 5 % in lactated ringers infusion (0 mLs Intravenous Hold 07/29/21 1622)  dextrose 50 % solution 0-50 mL (has no  administration in time range)  insulin regular, human (MYXREDLIN) 100 units/ 100 mL infusion (11.5 Units/hr Intravenous New Bag/Given 07/29/21 1622)  lactated ringers bolus 2,000 mL (2,000 mLs Intravenous New Bag/Given 07/29/21 1509)    ED Course/ Medical Decision Making/ A&P                           Medical Decision Making Amount and/or Complexity of Data Reviewed Labs: ordered. Radiology: ordered.  Risk Prescription drug management. Decision regarding hospitalization.   This patient presents to the ED for concern of ams, this involves an extensive number of treatment options, and is a complaint that carries with it a high risk of complications and morbidity.  The differential diagnosis includes electrolyte abn, infection, dka   Co morbidities that complicate the patient evaluation   arthritis, diabetes, difficult intubation, fibromyalgia, GERD, hiatal hernia, hypertension, hypothyroidism   Additional history obtained:  Additional history obtained from epic chart review External records from outside source obtained and reviewed including EMS report   Lab Tests:  I Ordered, and personally interpreted labs.  The pertinent results include:  cbc nl, ua neg for uti, but + ketones; vbg 7.036; k is 4.9, glucose 555, bun 43 and cr 1.75 (last bun/cr 13 and 0.83 in November); bhb >8   Imaging Studies ordered:  I ordered imaging studies including CXR  I independently visualized and interpreted imaging which showed  IMPRESSION:  No evidence of acute cardiopulmonary disease.   I agree with the radiologist interpretation   Cardiac Monitoring:  The patient  was maintained on a cardiac monitor.  I personally viewed and interpreted the cardiac monitored which showed an underlying rhythm of: sinus tachy   Medicines ordered and prescription drug management:  I ordered medication including IVFs and IV insulin  for dka  Reevaluation of the patient after these medicines showed that  the patient improved I have reviewed the patients home medicines and have made adjustments as needed   Critical Interventions:  IVFs and IV insulin   Consultations Obtained:  I requested consultation with the hospitalist (Dr. Sabino Gasser),  and discussed lab and imaging findings as well as pertinent plan - he will admit  Problem List / ED Course:  DKA:  IVFs ordered and IV insulin ordered.   Reevaluation:  After the interventions noted above, I reevaluated the patient and found that they have :improved.  MS is much improved.   Social Determinants of Health:  Lives at home   Dispostion:  After consideration of the diagnostic results and the patients response to treatment, I feel that the patent would benefit from admission.    CRITICAL CARE Performed by: Isla Pence   Total critical care time: 30 minutes  Critical care time was exclusive of separately billable procedures and treating other patients.  Critical care was necessary to treat or prevent imminent or life-threatening deterioration.  Critical care was time spent personally by me on the following activities: development of treatment plan with patient and/or surrogate as well as nursing, discussions with consultants, evaluation of patient's response to treatment, examination of patient, obtaining history from patient or surrogate, ordering and performing treatments and interventions, ordering and review of laboratory studies, ordering and review of radiographic studies, pulse oximetry and re-evaluation of patient's condition.         Final Clinical Impression(s) / ED Diagnoses Final diagnoses:  Diabetic ketoacidosis without coma associated with type 2 diabetes mellitus [facility])    Rx / DC Orders ED Discharge Orders     None         Isla Pence, MD 07/29/21 1625

## 2021-07-29 NOTE — ED Notes (Signed)
Hennie Duos, RN asked this NS to activate Code Stroke on pt. Hennie Duos, RN on the phone with Marlowe Sax, MD letting her know that he is activating Code Stroke. Hennie Duos, RN asked Marlowe Sax, MD to come and assess pt however she did not come so this NS asked him to get A.Gray's, MD approval for Code Stroke activation as MD name is needed for [facility] activation of Code Stroke.

## 2021-07-29 NOTE — ED Notes (Signed)
This RN made Rathore, MD aware that patient was throwing up and complaining of generalized pain but was not able to be more specific. Rathore requesting that this RN assess pt and asked her questions regarding her health conditions due to her not able to come down right away. It was then when this RN noticed that patient was starting to become more altered and having difficulty answering my questions and getting her words out. When asked what year it was patient stated "Jan 08, 1941". When asked what month is was patient stated "10-13-1940". This RN while on the phone with Marlowe Sax, MD informed her that the pt was having neurological changes and was worried for potential stroke due to this not being her normal since the pt being in my care. Marlowe Sax, MD requesting that this RN continue to ask pt questions regarding health conditions. This RN updated  Rathore, MD with pts Neurological status and explained to her that I would not be able to ask her the questions she wanted to know because I be preforming a neuro assessment for possible stroke and for her to please come down. Marlowe Sax, MD stated "I can't go down there I have another stroke" This RN informed Rathore, MD that I would be activating a code stroke on pt. This RN informed Pearline Cables, MD about the situation and came in to assess pt.

## 2021-07-29 NOTE — Assessment & Plan Note (Signed)
-   await med rec - can resume statin when recovered some

## 2021-07-29 NOTE — Assessment & Plan Note (Signed)
-   baseline creatinine ~ 0.8 - patient presents with increase in creat >0.3 mg/dL above baseline, creat increase >1.5x baseline presumed to have occurred within past 7 days PTA - presumed prerenal from poor intake and increased urinary frequency - continue IVF - trend BMP

## 2021-07-30 DIAGNOSIS — N179 Acute kidney failure, unspecified: Secondary | ICD-10-CM | POA: Diagnosis not present

## 2021-07-30 DIAGNOSIS — I159 Secondary hypertension, unspecified: Secondary | ICD-10-CM | POA: Diagnosis not present

## 2021-07-30 DIAGNOSIS — E1110 Type 2 diabetes mellitus with ketoacidosis without coma: Secondary | ICD-10-CM | POA: Diagnosis not present

## 2021-07-30 DIAGNOSIS — E785 Hyperlipidemia, unspecified: Secondary | ICD-10-CM | POA: Diagnosis not present

## 2021-07-30 LAB — CBG MONITORING, ED
Glucose-Capillary: 144 mg/dL — ABNORMAL HIGH (ref 70–99)
Glucose-Capillary: 144 mg/dL — ABNORMAL HIGH (ref 70–99)
Glucose-Capillary: 147 mg/dL — ABNORMAL HIGH (ref 70–99)
Glucose-Capillary: 159 mg/dL — ABNORMAL HIGH (ref 70–99)
Glucose-Capillary: 159 mg/dL — ABNORMAL HIGH (ref 70–99)
Glucose-Capillary: 161 mg/dL — ABNORMAL HIGH (ref 70–99)
Glucose-Capillary: 162 mg/dL — ABNORMAL HIGH (ref 70–99)
Glucose-Capillary: 170 mg/dL — ABNORMAL HIGH (ref 70–99)
Glucose-Capillary: 171 mg/dL — ABNORMAL HIGH (ref 70–99)
Glucose-Capillary: 179 mg/dL — ABNORMAL HIGH (ref 70–99)
Glucose-Capillary: 216 mg/dL — ABNORMAL HIGH (ref 70–99)

## 2021-07-30 LAB — CBC WITH DIFFERENTIAL/PLATELET
Abs Immature Granulocytes: 0.05 10*3/uL (ref 0.00–0.07)
Basophils Absolute: 0 10*3/uL (ref 0.0–0.1)
Basophils Relative: 0 %
Eosinophils Absolute: 0 10*3/uL (ref 0.0–0.5)
Eosinophils Relative: 0 %
HCT: 32 % — ABNORMAL LOW (ref 36.0–46.0)
Hemoglobin: 10.6 g/dL — ABNORMAL LOW (ref 12.0–15.0)
Immature Granulocytes: 1 %
Lymphocytes Relative: 22 %
Lymphs Abs: 1.7 10*3/uL (ref 0.7–4.0)
MCH: 32.2 pg (ref 26.0–34.0)
MCHC: 33.1 g/dL (ref 30.0–36.0)
MCV: 97.3 fL (ref 80.0–100.0)
Monocytes Absolute: 0.6 10*3/uL (ref 0.1–1.0)
Monocytes Relative: 8 %
Neutro Abs: 5.2 10*3/uL (ref 1.7–7.7)
Neutrophils Relative %: 69 %
Platelets: 178 10*3/uL (ref 150–400)
RBC: 3.29 MIL/uL — ABNORMAL LOW (ref 3.87–5.11)
RDW: 14.9 % (ref 11.5–15.5)
WBC: 7.6 10*3/uL (ref 4.0–10.5)
nRBC: 0 % (ref 0.0–0.2)

## 2021-07-30 LAB — MAGNESIUM
Magnesium: 1.7 mg/dL (ref 1.7–2.4)
Magnesium: 1.8 mg/dL (ref 1.7–2.4)

## 2021-07-30 LAB — BASIC METABOLIC PANEL
Anion gap: 15 (ref 5–15)
Anion gap: 7 (ref 5–15)
BUN: 32 mg/dL — ABNORMAL HIGH (ref 8–23)
BUN: 27 mg/dL — ABNORMAL HIGH (ref 8–23)
CO2: 15 mmol/L — ABNORMAL LOW (ref 22–32)
CO2: 23 mmol/L (ref 22–32)
Calcium: 7.9 mg/dL — ABNORMAL LOW (ref 8.9–10.3)
Calcium: 7.9 mg/dL — ABNORMAL LOW (ref 8.9–10.3)
Chloride: 106 mmol/L (ref 98–111)
Chloride: 107 mmol/L (ref 98–111)
Creatinine, Ser: 1.17 mg/dL — ABNORMAL HIGH (ref 0.44–1.00)
Creatinine, Ser: 0.86 mg/dL (ref 0.44–1.00)
GFR, Estimated: 47 mL/min — ABNORMAL LOW (ref >60)
GFR, Estimated: >60 mL/min (ref >60)
Glucose, Bld: 135 mg/dL — ABNORMAL HIGH (ref 70–99)
Glucose, Bld: 155 mg/dL — ABNORMAL HIGH (ref 70–99)
Potassium: 3.4 mmol/L — ABNORMAL LOW (ref 3.5–5.1)
Potassium: 3 mmol/L — ABNORMAL LOW (ref 3.5–5.1)
Sodium: 136 mmol/L (ref 135–145)
Sodium: 137 mmol/L (ref 135–145)

## 2021-07-30 LAB — GLUCOSE, CAPILLARY
Glucose-Capillary: 204 mg/dL — ABNORMAL HIGH (ref 70–99)
Glucose-Capillary: 199 mg/dL — ABNORMAL HIGH (ref 70–99)
Glucose-Capillary: 347 mg/dL — ABNORMAL HIGH (ref 70–99)

## 2021-07-30 LAB — PHOSPHORUS
Phosphorus: 1.3 mg/dL — ABNORMAL LOW (ref 2.5–4.6)
Phosphorus: 1.3 mg/dL — ABNORMAL LOW (ref 2.5–4.6)

## 2021-07-30 LAB — BETA-HYDROXYBUTYRIC ACID: Beta-Hydroxybutyric Acid: 5.01 mmol/L — ABNORMAL HIGH (ref 0.05–0.27)

## 2021-07-30 MED ORDER — ONDANSETRON HCL 4 MG/2ML IJ SOLN
4.0000 mg | Freq: Four times a day (QID) | INTRAMUSCULAR | Status: DC | PRN
  Administered 2021-07-30: 4 mg via INTRAVENOUS
  Filled 2021-07-30 (×2): qty 2

## 2021-07-30 MED ORDER — POTASSIUM CHLORIDE CRYS ER 20 MEQ PO TBCR: 40.0000 meq | EXTENDED_RELEASE_TABLET | Freq: Once | ORAL | Status: DC

## 2021-07-30 MED ORDER — ENOXAPARIN SODIUM 40 MG/0.4ML IJ SOSY
40.0000 mg | PREFILLED_SYRINGE | INTRAMUSCULAR | Status: DC
  Administered 2021-07-30 – 2021-08-04 (×6): 40 mg via SUBCUTANEOUS
  Filled 2021-07-30 (×6): qty 0.4

## 2021-07-30 MED ORDER — INSULIN GLARGINE-YFGN 100 UNIT/ML ~~LOC~~ SOLN
30.0000 [IU] | Freq: Every day | SUBCUTANEOUS | Status: DC
  Administered 2021-07-30: 30 [IU] via SUBCUTANEOUS
  Filled 2021-07-30 (×2): qty 0.3

## 2021-07-30 MED ORDER — MAGNESIUM SULFATE 2 GM/50ML IV SOLN
2.0000 g | Freq: Once | INTRAVENOUS | Status: AC
  Administered 2021-07-30: 2 g via INTRAVENOUS
  Filled 2021-07-30: qty 50

## 2021-07-30 MED ORDER — POTASSIUM PHOSPHATES 15 MMOLE/5ML IV SOLN
30.0000 mmol | Freq: Once | INTRAVENOUS | Status: AC
  Administered 2021-07-30: 30 mmol via INTRAVENOUS
  Filled 2021-07-30: qty 10

## 2021-07-30 MED ORDER — INSULIN ASPART 100 UNIT/ML IJ SOLN
0.0000 [IU] | Freq: Every day | INTRAMUSCULAR | Status: DC
  Administered 2021-07-30: 4 [IU] via SUBCUTANEOUS
  Administered 2021-07-31: 2 [IU] via SUBCUTANEOUS
  Administered 2021-08-01: 3 [IU] via SUBCUTANEOUS

## 2021-07-30 MED ORDER — INSULIN ASPART 100 UNIT/ML IJ SOLN
0.0000 [IU] | Freq: Three times a day (TID) | INTRAMUSCULAR | Status: DC
  Administered 2021-07-30: 2 [IU] via SUBCUTANEOUS
  Administered 2021-07-31 (×2): 7 [IU] via SUBCUTANEOUS
  Administered 2021-07-31: 5 [IU] via SUBCUTANEOUS
  Administered 2021-08-01 – 2021-08-02 (×4): 3 [IU] via SUBCUTANEOUS

## 2021-07-30 NOTE — Progress Notes (Signed)
PROGRESS NOTE        PATIENT DETAILS Name: Jennifer Boyer Age: 81 y.o. Sex: female Date of Birth: 12/24/1940 Admit Date: 07/29/2021 Admitting Physician Dwyane Dee, MD OZD:GUYQ, Edwyna Shell, MD (Inactive)  Brief Summary: Patient is a 81 y.o.  female with history of DM-2, HTN, HLD, medication noncompliance-who presented with polydipsia/polyuria/confusion-found to have DKA and subsequently admitted to the hospitalist service.  Significant events: 7/11>> admit to [facility]-in DKA/encephalopathy.  Significant studies: 7/11>>CXR: No PNA 7/11>> CT head: No acute intracranial abnormality.  Significant microbiology data: None  Procedures: None  Consults: Neurology  Subjective: Slightly lethargic but answers most of my questions appropriately.  Moves all 4 extremities when asked.  Speech is slow but clear.  Objective: Vitals: Blood pressure 122/62, pulse 81, temperature 98.3 F (36.8 C), temperature source Oral, resp. rate (!) 31, height '5\' 7"'$  (1.702 m), weight 104.3 kg, SpO2 96 %.   Exam: Gen Exam:Alert awake-not in any distress.  Only minimally confused. HEENT:atraumatic, normocephalic Chest: B/L clear to auscultation anteriorly CVS:S1S2 regular Abdomen:soft non tender, non distended Extremities:no edema Neurology: Non focal Skin: no rash  Pertinent Labs/Radiology:    Latest Ref Rng & Units 07/30/2021    1:57 AM 07/29/2021    3:50 PM 07/29/2021    3:40 PM  CBC  WBC 4.0 - 10.5 K/uL 7.6     Hemoglobin 12.0 - 15.0 g/dL 10.6  14.6  15.3   Hematocrit 36.0 - 46.0 % 32.0  43.0  45.0   Platelets 150 - 400 K/uL 178       Lab Results  Component Value Date   NA 136 07/30/2021   K 3.4 (L) 07/30/2021   CL 106 07/30/2021   CO2 15 (L) 07/30/2021      Assessment/Plan: DKA: Due to noncompliance-continue IVF/IV insulin until anion gap improves further.   AKI: Due to osmotic diuresis in the setting of DKA-improving.  Follow.  Hyperkalemia: Due to  DKA/AKI-resolved.  Acute metabolic encephalopathy: Seems to have improved than what was documented in H&P-only minimally confused.  Concern for some strokelike symptoms overnight-hence neurology consulted and MRI brain pending.  However doubt/low probability for CVA at this point.  If MRI brain negative for CVA-no further work-up anticipated.  Hypokalemia/hypophosphatemia/hypomagnesemia: Replete and recheck.  HTN: BP stable without the use of any antihypertensives-resume when able.  Hypothyroidism: Continue Synthroid.  TSH done on 7/11 stable.  HLD: Resume statin over the next few days.  Macrocytic anemia: Folate/B12 level stable-PCP to monitor further.  Deconditioning: Obtain PT/OT.  Code status:   Code Status: Full Code   DVT Prophylaxis: enoxaparin (LOVENOX) injection 30 mg Start: 07/29/21 1700   Family Communication: Son-[phone]-updated over the phone.   Disposition Plan: Status is: Inpatient Remains inpatient appropriate because:    Planned Discharge Destination:Home health   Diet: Diet Order             Diet clear liquid Room service appropriate? Yes; Fluid consistency: Thin  Diet effective now                     Antimicrobial agents: Anti-infectives (From admission, onward)    None        MEDICATIONS: Scheduled Meds:  enoxaparin (LOVENOX) injection  30 mg Subcutaneous Q24H   levothyroxine  75 mcg Oral Q0600   Continuous Infusions:  dextrose 5 % and 0.9% NaCl  100 mL/hr at 07/30/21 0840   insulin 3.4 Units/hr (07/30/21 0925)   magnesium sulfate bolus IVPB     potassium PHOSPHATE IVPB (in mmol)     sodium bicarbonate 150 mEq in sterile water 1,150 mL infusion 100 mL/hr at 07/30/21 0606   PRN Meds:.dextrose, hydrALAZINE, labetalol   I have personally reviewed following labs and imaging studies  LABORATORY DATA: CBC: Recent Labs  Lab 07/29/21 1453 07/29/21 1540 07/29/21 1550 07/30/21 0157  WBC 9.1  --   --  7.6  NEUTROABS  7.8*  --   --  5.2  HGB 13.3 15.3* 14.6 10.6*  HCT 44.7 45.0 43.0 32.0*  MCV 107.5*  --   --  97.3  PLT 231  --   --  948    Basic Metabolic Panel: Recent Labs  Lab 07/29/21 1453 07/29/21 1540 07/29/21 1550 07/29/21 2222 07/30/21 0157  NA 138 133* 139 136 136  K 4.9 >8.5* 4.9 3.4* 3.4*  CL 105  --   --  106 106  CO2 <7*  --   --  10* 15*  GLUCOSE 555*  --   --  156* 135*  BUN 43*  --   --  36* 32*  CREATININE 1.75*  --   --  1.33* 1.17*  CALCIUM 8.7*  --   --  8.5* 7.9*  MG  --   --   --  1.7 1.7  PHOS  --   --   --  1.6* 1.3*    GFR: Estimated Creatinine Clearance: 47.6 mL/min (A) (by C-G formula based on SCr of 1.17 mg/dL (H)).  Liver Function Tests: No results for input(s): "AST", "ALT", "ALKPHOS", "BILITOT", "PROT", "ALBUMIN" in the last 168 hours. No results for input(s): "LIPASE", "AMYLASE" in the last 168 hours. No results for input(s): "AMMONIA" in the last 168 hours.  Coagulation Profile: No results for input(s): "INR", "PROTIME" in the last 168 hours.  Cardiac Enzymes: No results for input(s): "CKTOTAL", "CKMB", "CKMBINDEX", "TROPONINI" in the last 168 hours.  BNP (last 3 results) No results for input(s): "PROBNP" in the last 8760 hours.  Lipid Profile: No results for input(s): "CHOL", "HDL", "LDLCALC", "TRIG", "CHOLHDL", "LDLDIRECT" in the last 72 hours.  Thyroid Function Tests: Recent Labs    07/29/21 2222  TSH 2.073    Anemia Panel: Recent Labs    07/29/21 2222  VITAMINB12 456  FOLATE 13.9    Urine analysis:    Component Value Date/Time   COLORURINE YELLOW 07/29/2021 1517   APPEARANCEUR HAZY (A) 07/29/2021 1517   LABSPEC 1.021 07/29/2021 1517   PHURINE 5.0 07/29/2021 1517   GLUCOSEU >=500 (A) 07/29/2021 1517   HGBUR NEGATIVE 07/29/2021 1517   BILIRUBINUR NEGATIVE 07/29/2021 1517   KETONESUR 80 (A) 07/29/2021 1517   PROTEINUR 30 (A) 07/29/2021 1517   NITRITE NEGATIVE 07/29/2021 1517   LEUKOCYTESUR NEGATIVE 07/29/2021 1517     Sepsis Labs: Lactic Acid, Venous No results found for: "LATICACIDVEN"  MICROBIOLOGY: No results found for this or any previous visit (from the past 240 hour(s)).  RADIOLOGY STUDIES/RESULTS: CT HEAD CODE STROKE WO CONTRAST  Result Date: 07/29/2021 CLINICAL DATA:  Code stroke. Initial evaluation for neuro deficit, stroke suspected EXAM: CT HEAD WITHOUT CONTRAST TECHNIQUE: Contiguous axial images were obtained from the base of the skull through the vertex without intravenous contrast. RADIATION DOSE REDUCTION: This exam was performed according to the departmental dose-optimization program which includes automated exposure control, adjustment of the mA and/or kV according to patient size  and/or use of iterative reconstruction technique. COMPARISON:  Prior study from 03/02/2008. FINDINGS: Brain: Cerebral volume within normal limits. No acute intracranial hemorrhage. No acute large vessel territory infarct. No mass lesion or midline shift. No hydrocephalus or extra-axial fluid collection. Vascular: No hyperdense vessel. Skull: Scalp soft tissues and calvarium within normal limits. Sinuses/Orbits: Globes and orbital soft tissues within normal limits. Paranasal sinuses are clear. No significant mastoid effusion. Other: None. ASPECTS [facility] Stroke Program Early CT Score) - Ganglionic level infarction (caudate, lentiform nuclei, internal capsule, insula, M1-M3 cortex): 7 - Supraganglionic infarction (M4-M6 cortex): 3 Total score (0-10 with 10 being normal): 10 IMPRESSION: 1. Negative head CT.  No acute intracranial abnormality. 2. ASPECTS is 10. These results were communicated to Dr. Leonel Ramsay at 9:13 pm on 07/29/2021 by text page via the [facility] messaging system. Electronically Signed   By: Jeannine Boga M.D.   On: 07/29/2021 21:14   DG Chest Portable 1 View  Result Date: 07/29/2021 CLINICAL DATA:  Altered mental status EXAM: PORTABLE CHEST 1 VIEW COMPARISON:  None Available. FINDINGS: The  cardiomediastinal silhouette is within normal limits. There is no focal airspace disease. There is no pleural effusion. No pneumothorax. There is no acute osseous abnormality. Thoracic spondylosis. IMPRESSION: No evidence of acute cardiopulmonary disease. Electronically Signed   By: Maurine Simmering M.D.   On: 07/29/2021 14:57     LOS: 1 day   Oren Binet, MD  [facility]    To contact the attending provider between 7A-7P or the covering provider during after hours 7P-7A, please log into the web site [email or web address] and access using universal [place] password for that web site. If you do not have the password, please call the hospital operator.  07/30/2021, 10:16 AM

## 2021-07-30 NOTE — ED Notes (Signed)
Partial linen change provider along with perineal care

## 2021-07-30 NOTE — ED Notes (Signed)
Secretary was asked by this RN to please page the provider regarding patient care questions

## 2021-07-30 NOTE — ED Notes (Signed)
This RN calls MRI tech at this time. MRI tech reports that they haven't seen the patient yet due to a stimulator placement which they have concern for. This RN to make sure that the provider is aware that the patient hasn't gone to MRI yet for this reason

## 2021-07-30 NOTE — ED Notes (Signed)
ED TO INPATIENT HANDOFF REPORT   S Name/Age/Gender Jennifer Boyer 81 y.o. female Room/Bed: 023C/023C  Code Status   Code Status: Full Code  Home/SNF/Other Home Patient oriented to: self and place Is this baseline? No   Triage Complete: Triage complete  Chief Complaint DKA, type 2 ([facility]) [E11.10]  Triage Note Per EMS pt son reports increased AMS over the last few weeks. Excessive thirst and urination. No complaint with medications. With EMS pt was A/ox4. Pt son states she her memory has decreased.   EMS 516 CBG 151/76 100 HR  30 RR 100 RA 18LAC  1L NS in route    Allergies No Known Allergies  Level of Care/Admitting Diagnosis ED Disposition     ED Disposition  Admit   Condition  --   Comment  Hospital Area: [facility] [[id number]]  Level of Care: Progressive [102]  Admit to Progressive based on following criteria: MULTISYSTEM THREATS such as stable sepsis, metabolic/electrolyte imbalance with or without encephalopathy that is responding to early treatment.  May admit patient to [facility] or [facility] if equivalent level of care is available:: No  Covid Evaluation: Asymptomatic - no recent exposure (last 10 days) testing not required  Diagnosis: DKA, type 2 [facility]) [[id number]]  Admitting Physician: Dwyane Dee [phone]  Attending Physician: Dwyane Dee [[id number]]  Certification:: I certify this patient will need inpatient services for at least 2 midnights  Estimated Length of Stay: 3          B Medical/Surgery History Past Medical History:  Diagnosis Date   Arthritis    OA   Complication of anesthesia    pt has been told she is hard to intubate.   Diabetes mellitus without complication ([place])    Difficult intubation    Fibromyalgia    GERD (gastroesophageal reflux disease)    Headache    HX MIGRAINES  NONE IN LONG TIME   History of hiatal hernia    Hypertension    Hypothyroidism    Past Surgical History:  Procedure Laterality  Date   BREAST SURGERY     bilateral mastectomy   CHOLECYSTECTOMY     DILATATION & CURETTAGE/HYSTEROSCOPY WITH TRUECLEAR N/A 09/30/2012   Procedure: DILATATION & CURETTAGE/HYSTEROSCOPY WITH TRUECLEAR;  Surgeon: Shon Millet II, MD;  Location: [facility];  Service: Gynecology;  Laterality: N/A;   DILATION AND CURETTAGE OF UTERUS     uterine polyp   KNEE SURGERY     LUMBAR LAMINECTOMY/DECOMPRESSION MICRODISCECTOMY Left 03/24/2017   Procedure: LEFT LUMBAR FIVE-SACRAL ONE MICRODISCECTOMY, LEFT LUMBAR FOUR-FIVE DECOMPRESSIVE LAMINECTOMY;  Surgeon: Kary Kos, MD;  Location: [facility];  Service: Neurosurgery;  Laterality: Left;  Left L4-5 L5-S1 Laminectomy/Foraminotomy   LUMBAR LAMINECTOMY/DECOMPRESSION MICRODISCECTOMY Left 09/01/2017   Procedure: Microdiscectomy - Lumbar Five-Sacral One - left redo;  Surgeon: Kary Kos, MD;  Location: [facility];  Service: Neurosurgery;  Laterality: Left;  left   TONSILLECTOMY       A IV Location/Drains/Wounds Patient Lines/Drains/Airways Status     Active Line/Drains/Airways     Name Placement date Placement time Site Days   Peripheral IV 12/07/20 20 G Left Antecubital 12/07/20  1621  Antecubital  235   Peripheral IV 07/29/21 18 G Posterior;Right Hand 07/29/21  1554  Hand  1   Peripheral IV 07/30/21 20 G 1" Left;Posterior Forearm 07/30/21  1116  Forearm  less than 1   Incision 09/30/12 Perineum Other (Comment) 09/30/12  0720  -- 3225   Incision (Closed) 03/24/17 Back Other (  Comment) 03/24/17  1251  -- 1589   Incision (Closed) 09/01/17 Back Other (Comment) 09/01/17  1031  -- 1428            Intake/Output Last 24 hours No intake or output data in the 24 hours ending 07/30/21 1248  Labs/Imaging Results for orders placed or performed during the hospital encounter of 07/29/21 (from the past 48 hour(s))  CBG monitoring, ED     Status: Abnormal   Collection Time: 07/29/21  2:43 PM  Result Value Ref Range   Glucose-Capillary 524 (HH) 70 - 99 mg/dL    Comment:  Glucose reference range applies only to samples taken after fasting for at least 8 hours.   Comment 1 Notify RN   Basic metabolic panel     Status: Abnormal   Collection Time: 07/29/21  2:53 PM  Result Value Ref Range   Sodium 138 135 - 145 mmol/L   Potassium 4.9 3.5 - 5.1 mmol/L   Chloride 105 98 - 111 mmol/L   CO2 <7 (L) 22 - 32 mmol/L   Glucose, Bld 555 (HH) 70 - 99 mg/dL    Comment: CRITICAL RESULT CALLED TO, READ BACK BY AND VERIFIED WITH A.COSTALES,RN '@1540'$  07/29/2021 VANG.J Glucose reference range applies only to samples taken after fasting for at least 8 hours.    BUN 43 (H) 8 - 23 mg/dL   Creatinine, Ser 1.75 (H) 0.44 - 1.00 mg/dL   Calcium 8.7 (L) 8.9 - 10.3 mg/dL   GFR, Estimated 29 (L) >60 mL/min    Comment: (NOTE) Calculated using the CKD-EPI Creatinine Equation (2021)    Anion gap NOT CALCULATED 5 - 15    Comment: Performed at [facility] [address] [place], [place] [address]  Beta-hydroxybutyric acid     Status: Abnormal   Collection Time: 07/29/21  2:53 PM  Result Value Ref Range   Beta-Hydroxybutyric Acid >8.00 (H) 0.05 - 0.27 mmol/L    Comment: RESULT CONFIRMED BY MANUAL DILUTION Performed at [facility] [address] [place], [place] [address]   CBC with Differential (PNL)     Status: Abnormal   Collection Time: 07/29/21  2:53 PM  Result Value Ref Range   WBC 9.1 4.0 - 10.5 K/uL   RBC 4.16 3.87 - 5.11 MIL/uL   Hemoglobin 13.3 12.0 - 15.0 g/dL   HCT 44.7 36.0 - 46.0 %   MCV 107.5 (H) 80.0 - 100.0 fL   MCH 32.0 26.0 - 34.0 pg   MCHC 29.8 (L) 30.0 - 36.0 g/dL   RDW 15.0 11.5 - 15.5 %   Platelets 231 150 - 400 K/uL   nRBC 0.0 0.0 - 0.2 %   Neutrophils Relative % 85 %   Neutro Abs 7.8 (H) 1.7 - 7.7 K/uL   Lymphocytes Relative 8 %   Lymphs Abs 0.7 0.7 - 4.0 K/uL   Monocytes Relative 6 %   Monocytes Absolute 0.5 0.1 - 1.0 K/uL   Eosinophils Relative 0 %   Eosinophils Absolute 0.0 0.0 - 0.5 K/uL   Basophils Relative 0 %   Basophils  Absolute 0.0 0.0 - 0.1 K/uL   Immature Granulocytes 1 %   Abs Immature Granulocytes 0.11 (H) 0.00 - 0.07 K/uL    Comment: Performed at [facility] [address], [place], [place] [address]  Osmolality     Status: Abnormal   Collection Time: 07/29/21  2:53 PM  Result Value Ref Range   Osmolality 354 (HH)  275 - 295 mOsm/kg    Comment: REPEATED TO VERIFY CRITICAL RESULT CALLED TO, READ BACK BY AND VERIFIED WITH: A COSTALES RN 1551 07/29/2021 BY R VERAAR Performed at [facility], [place] [address], [place], [place] [address]   Urinalysis, Routine w reflex microscopic Urine, Clean Catch     Status: Abnormal   Collection Time: 07/29/21  3:17 PM  Result Value Ref Range   Color, Urine YELLOW YELLOW   APPearance HAZY (A) CLEAR   Specific Gravity, Urine 1.021 1.005 - 1.030   pH 5.0 5.0 - 8.0   Glucose, UA >=500 (A) NEGATIVE mg/dL   Hgb urine dipstick NEGATIVE NEGATIVE   Bilirubin Urine NEGATIVE NEGATIVE   Ketones, ur 80 (A) NEGATIVE mg/dL   Protein, ur 30 (A) NEGATIVE mg/dL   Nitrite NEGATIVE NEGATIVE   Leukocytes,Ua NEGATIVE NEGATIVE   RBC / HPF 0-5 0 - 5 RBC/hpf   WBC, UA 0-5 0 - 5 WBC/hpf   Bacteria, UA FEW (A) NONE SEEN   Squamous Epithelial / LPF 0-5 0 - 5   Budding Yeast PRESENT     Comment: Performed at [facility], [place] [address], [place], [place] [address]  I-Stat Venous Blood Gas, ED     Status: Abnormal   Collection Time: 07/29/21  3:40 PM  Result Value Ref Range   pH, Ven 7.059 (LL) 7.25 - 7.43   pCO2, Ven 15.9 (LL) 44 - 60 mmHg   pO2, Ven 67 (H) 32 - 45 mmHg   Bicarbonate 4.5 (L) 20.0 - 28.0 mmol/L   TCO2 <5 (L) 22 - 32 mmol/L   O2 Saturation 84 %   Acid-base deficit 24.0 (H) 0.0 - 2.0 mmol/L   Sodium 133 (L) 135 - 145 mmol/L   Potassium >8.5 (HH) 3.5 - 5.1 mmol/L   Calcium, Ion 1.02 (L) 1.15 - 1.40 mmol/L   HCT 45.0 36.0 - 46.0 %   Hemoglobin 15.3 (H) 12.0 - 15.0 g/dL   Sample type VENOUS    Comment NOTIFIED PHYSICIAN   I-Stat venous blood gas,  ED     Status: Abnormal   Collection Time: 07/29/21  3:50 PM  Result Value Ref Range   pH, Ven 7.036 (LL) 7.25 - 7.43   pCO2, Ven 16.4 (LL) 44 - 60 mmHg   pO2, Ven 198 (H) 32 - 45 mmHg   Bicarbonate 4.4 (L) 20.0 - 28.0 mmol/L   TCO2 <5 (L) 22 - 32 mmol/L   O2 Saturation 99 %   Acid-base deficit 25.0 (H) 0.0 - 2.0 mmol/L   Sodium 139 135 - 145 mmol/L   Potassium 4.9 3.5 - 5.1 mmol/L   Calcium, Ion 1.15 1.15 - 1.40 mmol/L   HCT 43.0 36.0 - 46.0 %   Hemoglobin 14.6 12.0 - 15.0 g/dL   Sample type VENOUS    Comment NOTIFIED PHYSICIAN   CBG monitoring, ED     Status: Abnormal   Collection Time: 07/29/21  5:24 PM  Result Value Ref Range   Glucose-Capillary 466 (H) 70 - 99 mg/dL    Comment: Glucose reference range applies only to samples taken after fasting for at least 8 hours.  CBG monitoring, ED     Status: Abnormal   Collection Time: 07/29/21  6:24 PM  Result Value Ref Range   Glucose-Capillary 435 (H) 70 - 99 mg/dL    Comment: Glucose reference range applies only to samples taken after fasting for at least 8 hours.  CBG monitoring, ED  Status: Abnormal   Collection Time: 07/29/21  7:37 PM  Result Value Ref Range   Glucose-Capillary 310 (H) 70 - 99 mg/dL    Comment: Glucose reference range applies only to samples taken after fasting for at least 8 hours.  CBG monitoring, ED     Status: Abnormal   Collection Time: 07/29/21  8:36 PM  Result Value Ref Range   Glucose-Capillary 253 (H) 70 - 99 mg/dL    Comment: Glucose reference range applies only to samples taken after fasting for at least 8 hours.  CBG monitoring, ED     Status: Abnormal   Collection Time: 07/29/21  9:53 PM  Result Value Ref Range   Glucose-Capillary 236 (H) 70 - 99 mg/dL    Comment: Glucose reference range applies only to samples taken after fasting for at least 8 hours.  Basic metabolic panel     Status: Abnormal   Collection Time: 07/29/21 10:22 PM  Result Value Ref Range   Sodium 136 135 - 145 mmol/L    Potassium 3.4 (L) 3.5 - 5.1 mmol/L   Chloride 106 98 - 111 mmol/L   CO2 10 (L) 22 - 32 mmol/L   Glucose, Bld 156 (H) 70 - 99 mg/dL    Comment: Glucose reference range applies only to samples taken after fasting for at least 8 hours.   BUN 36 (H) 8 - 23 mg/dL   Creatinine, Ser 1.33 (H) 0.44 - 1.00 mg/dL   Calcium 8.5 (L) 8.9 - 10.3 mg/dL   GFR, Estimated 40 (L) >60 mL/min    Comment: (NOTE) Calculated using the CKD-EPI Creatinine Equation (2021)    Anion gap 20 (H) 5 - 15    Comment: Performed at [facility] [address], [place], [place] [address]  Beta-hydroxybutyric acid     Status: Abnormal   Collection Time: 07/29/21 10:22 PM  Result Value Ref Range   Beta-Hydroxybutyric Acid 7.02 (H) 0.05 - 0.27 mmol/L    Comment: RESULT CONFIRMED BY MANUAL DILUTION Performed at [facility] [address], [place], [place] [address]   Magnesium     Status: None   Collection Time: 07/29/21 10:22 PM  Result Value Ref Range   Magnesium 1.7 1.7 - 2.4 mg/dL    Comment: Performed at [facility], [place] [address], [place], [place] [address]  Phosphorus     Status: Abnormal   Collection Time: 07/29/21 10:22 PM  Result Value Ref Range   Phosphorus 1.6 (L) 2.5 - 4.6 mg/dL    Comment: Performed at [facility] [address], [place], [place] [address]  Folate     Status: None   Collection Time: 07/29/21 10:22 PM  Result Value Ref Range   Folate 13.9 >5.9 ng/mL    Comment: Performed at [facility], [place] [address] [place], [place] [address]  Vitamin B12     Status: None   Collection Time: 07/29/21 10:22 PM  Result Value Ref Range   Vitamin B-12 456 180 - 914 pg/mL    Comment: (NOTE) This assay is not validated for testing neonatal or myeloproliferative syndrome specimens for Vitamin B12 levels. Performed at [facility], [place] [address], [place], [place] [address]   TSH     Status: None   Collection Time: 07/29/21 10:22 PM  Result Value Ref Range    TSH 2.073 0.350 - 4.500 uIU/mL    Comment: Performed by a 3rd Generation assay with a  functional sensitivity of <=0.01 uIU/mL. Performed at [facility], [place] [address] [place], [place] [address]   CBG monitoring, ED     Status: Abnormal   Collection Time: 07/29/21 10:47 PM  Result Value Ref Range   Glucose-Capillary 223 (H) 70 - 99 mg/dL    Comment: Glucose reference range applies only to samples taken after fasting for at least 8 hours.  CBG monitoring, ED     Status: Abnormal   Collection Time: 07/30/21 12:03 AM  Result Value Ref Range   Glucose-Capillary 162 (H) 70 - 99 mg/dL    Comment: Glucose reference range applies only to samples taken after fasting for at least 8 hours.  CBG monitoring, ED     Status: Abnormal   Collection Time: 07/30/21  1:05 AM  Result Value Ref Range   Glucose-Capillary 147 (H) 70 - 99 mg/dL    Comment: Glucose reference range applies only to samples taken after fasting for at least 8 hours.  Basic metabolic panel     Status: Abnormal   Collection Time: 07/30/21  1:57 AM  Result Value Ref Range   Sodium 136 135 - 145 mmol/L   Potassium 3.4 (L) 3.5 - 5.1 mmol/L   Chloride 106 98 - 111 mmol/L   CO2 15 (L) 22 - 32 mmol/L   Glucose, Bld 135 (H) 70 - 99 mg/dL    Comment: Glucose reference range applies only to samples taken after fasting for at least 8 hours.   BUN 32 (H) 8 - 23 mg/dL   Creatinine, Ser 1.17 (H) 0.44 - 1.00 mg/dL   Calcium 7.9 (L) 8.9 - 10.3 mg/dL   GFR, Estimated 47 (L) >60 mL/min    Comment: (NOTE) Calculated using the CKD-EPI Creatinine Equation (2021)    Anion gap 15 5 - 15    Comment: Performed at [facility] [address], [place], [place] [address]  Magnesium     Status: None   Collection Time: 07/30/21  1:57 AM  Result Value Ref Range   Magnesium 1.7 1.7 - 2.4 mg/dL    Comment: Performed at [facility] [address], [place], [place] [address]  Phosphorus     Status: Abnormal   Collection Time:  07/30/21  1:57 AM  Result Value Ref Range   Phosphorus 1.3 (L) 2.5 - 4.6 mg/dL    Comment: Performed at [facility] [address], [place], [place] [address]  CBC with Differential/Platelet     Status: Abnormal   Collection Time: 07/30/21  1:57 AM  Result Value Ref Range   WBC 7.6 4.0 - 10.5 K/uL   RBC 3.29 (L) 3.87 - 5.11 MIL/uL   Hemoglobin 10.6 (L) 12.0 - 15.0 g/dL   HCT 32.0 (L) 36.0 - 46.0 %   MCV 97.3 80.0 - 100.0 fL    Comment: REPEATED TO VERIFY DELTA CHECK NOTED    MCH 32.2 26.0 - 34.0 pg   MCHC 33.1 30.0 - 36.0 g/dL   RDW 14.9 11.5 - 15.5 %   Platelets 178 150 - 400 K/uL   nRBC 0.0 0.0 - 0.2 %   Neutrophils Relative % 69 %   Neutro Abs 5.2 1.7 - 7.7 K/uL   Lymphocytes Relative 22 %   Lymphs Abs 1.7 0.7 - 4.0 K/uL   Monocytes Relative 8 %   Monocytes Absolute 0.6 0.1 - 1.0 K/uL   Eosinophils Relative 0 %   Eosinophils Absolute 0.0 0.0 -  0.5 K/uL   Basophils Relative 0 %   Basophils Absolute 0.0 0.0 - 0.1 K/uL   Immature Granulocytes 1 %   Abs Immature Granulocytes 0.05 0.00 - 0.07 K/uL    Comment: Performed at [facility], [place] [address] [place], [place] [address]  Beta-hydroxybutyric acid     Status: Abnormal   Collection Time: 07/30/21  1:57 AM  Result Value Ref Range   Beta-Hydroxybutyric Acid 5.01 (H) 0.05 - 0.27 mmol/L    Comment: RESULT CONFIRMED BY MANUAL DILUTION Performed at [facility] [address] [place], [place] [address]   CBG monitoring, ED     Status: Abnormal   Collection Time: 07/30/21  2:00 AM  Result Value Ref Range   Glucose-Capillary 144 (H) 70 - 99 mg/dL    Comment: Glucose reference range applies only to samples taken after fasting for at least 8 hours.  CBG monitoring, ED     Status: Abnormal   Collection Time: 07/30/21  4:05 AM  Result Value Ref Range   Glucose-Capillary 161 (H) 70 - 99 mg/dL    Comment: Glucose reference range applies only to samples taken after fasting for at least 8 hours.   Comment 1  Notify RN    Comment 2 Document in Chart   CBG monitoring, ED     Status: Abnormal   Collection Time: 07/30/21  5:10 AM  Result Value Ref Range   Glucose-Capillary 170 (H) 70 - 99 mg/dL    Comment: Glucose reference range applies only to samples taken after fasting for at least 8 hours.  CBG monitoring, ED     Status: Abnormal   Collection Time: 07/30/21  6:13 AM  Result Value Ref Range   Glucose-Capillary 179 (H) 70 - 99 mg/dL    Comment: Glucose reference range applies only to samples taken after fasting for at least 8 hours.   Comment 1 Notify RN    Comment 2 Document in Chart   CBG monitoring, ED     Status: Abnormal   Collection Time: 07/30/21  7:53 AM  Result Value Ref Range   Glucose-Capillary 159 (H) 70 - 99 mg/dL    Comment: Glucose reference range applies only to samples taken after fasting for at least 8 hours.   Comment 1 Notify RN    Comment 2 Document in Chart   CBG monitoring, ED     Status: Abnormal   Collection Time: 07/30/21  9:24 AM  Result Value Ref Range   Glucose-Capillary 171 (H) 70 - 99 mg/dL    Comment: Glucose reference range applies only to samples taken after fasting for at least 8 hours.   Comment 1 Notify RN    Comment 2 Document in Chart   CBG monitoring, ED     Status: Abnormal   Collection Time: 07/30/21 10:18 AM  Result Value Ref Range   Glucose-Capillary 144 (H) 70 - 99 mg/dL    Comment: Glucose reference range applies only to samples taken after fasting for at least 8 hours.  Magnesium     Status: None   Collection Time: 07/30/21 11:10 AM  Result Value Ref Range   Magnesium 1.8 1.7 - 2.4 mg/dL    Comment: Performed at [facility] [address], [place], [place] [address]  Phosphorus     Status: Abnormal   Collection Time: 07/30/21 11:10 AM  Result Value Ref Range   Phosphorus 1.3 (L) 2.5 - 4.6 mg/dL    Comment:  Performed at [facility], [place] [address], [place], [place] [address]  Basic metabolic panel     Status: Abnormal    Collection Time: 07/30/21 11:10 AM  Result Value Ref Range   Sodium 137 135 - 145 mmol/L   Potassium 3.0 (L) 3.5 - 5.1 mmol/L   Chloride 107 98 - 111 mmol/L   CO2 23 22 - 32 mmol/L   Glucose, Bld 155 (H) 70 - 99 mg/dL    Comment: Glucose reference range applies only to samples taken after fasting for at least 8 hours.   BUN 27 (H) 8 - 23 mg/dL   Creatinine, Ser 0.86 0.44 - 1.00 mg/dL   Calcium 7.9 (L) 8.9 - 10.3 mg/dL   GFR, Estimated >60 >60 mL/min    Comment: (NOTE) Calculated using the CKD-EPI Creatinine Equation (2021)    Anion gap 7 5 - 15    Comment: Performed at [facility] [address], [place], [place] [address]  CBG monitoring, ED     Status: Abnormal   Collection Time: 07/30/21 11:35 AM  Result Value Ref Range   Glucose-Capillary 159 (H) 70 - 99 mg/dL    Comment: Glucose reference range applies only to samples taken after fasting for at least 8 hours.   CT HEAD CODE STROKE WO CONTRAST  Result Date: 07/29/2021 CLINICAL DATA:  Code stroke. Initial evaluation for neuro deficit, stroke suspected EXAM: CT HEAD WITHOUT CONTRAST TECHNIQUE: Contiguous axial images were obtained from the base of the skull through the vertex without intravenous contrast. RADIATION DOSE REDUCTION: This exam was performed according to the departmental dose-optimization program which includes automated exposure control, adjustment of the mA and/or kV according to patient size and/or use of iterative reconstruction technique. COMPARISON:  Prior study from 03/02/2008. FINDINGS: Brain: Cerebral volume within normal limits. No acute intracranial hemorrhage. No acute large vessel territory infarct. No mass lesion or midline shift. No hydrocephalus or extra-axial fluid collection. Vascular: No hyperdense vessel. Skull: Scalp soft tissues and calvarium within normal limits. Sinuses/Orbits: Globes and orbital soft tissues within normal limits. Paranasal sinuses are clear. No significant mastoid effusion.  Other: None. ASPECTS [facility] Stroke Program Early CT Score) - Ganglionic level infarction (caudate, lentiform nuclei, internal capsule, insula, M1-M3 cortex): 7 - Supraganglionic infarction (M4-M6 cortex): 3 Total score (0-10 with 10 being normal): 10 IMPRESSION: 1. Negative head CT.  No acute intracranial abnormality. 2. ASPECTS is 10. These results were communicated to Dr. Leonel Ramsay at 9:13 pm on 07/29/2021 by text page via the [facility] messaging system. Electronically Signed   By: Jeannine Boga M.D.   On: 07/29/2021 21:14   DG Chest Portable 1 View  Result Date: 07/29/2021 CLINICAL DATA:  Altered mental status EXAM: PORTABLE CHEST 1 VIEW COMPARISON:  None Available. FINDINGS: The cardiomediastinal silhouette is within normal limits. There is no focal airspace disease. There is no pleural effusion. No pneumothorax. There is no acute osseous abnormality. Thoracic spondylosis. IMPRESSION: No evidence of acute cardiopulmonary disease. Electronically Signed   By: Maurine Simmering M.D.   On: 07/29/2021 14:57    Pending Labs Unresulted Labs (From admission, onward)     Start     Ordered   07/30/21 0500  CBC with Differential/Platelet  Daily at 5am,   R      07/29/21 1645   07/29/21 1645  Magnesium  (Diabetes Ketoacidosis (DKA))  Now then every 8 hours,   R      07/29/21 1645   07/29/21 1645  Phosphorus  (Diabetes Ketoacidosis (DKA))  Now then every 8 hours,   R      07/29/21 1645   07/29/21 1644  Hemoglobin A1c  (Diabetes Ketoacidosis (DKA))  Add-on,   AD       Comments: To assess prior glycemic control.    07/29/21 1645            Vitals/Pain Today's Vitals   07/30/21 0730 07/30/21 0915 07/30/21 0945 07/30/21 1230  BP: 122/62 118/60 (!) 101/53 (!) 117/52  Pulse: 81 79 76 75  Resp: (!) 31 (!) 24 (!) 31 19  Temp:      TempSrc:      SpO2: 96% 96% 96% 99%  Weight:      Height:      PainSc:        Isolation Precautions No active isolations  Medications Medications  dextrose 50  % solution 0-50 mL (has no administration in time range)  insulin regular, human (MYXREDLIN) 100 units/ 100 mL infusion (2.8 Units/hr Intravenous Rate/Dose Change 07/30/21 1137)  enoxaparin (LOVENOX) injection 30 mg (30 mg Subcutaneous Given 07/29/21 1739)  sodium bicarbonate 150 mEq in sterile water 1,150 mL infusion ( Intravenous New Bag/Given 07/30/21 0606)  labetalol (NORMODYNE) injection 10 mg (has no administration in time range)  hydrALAZINE (APRESOLINE) injection 10 mg (has no administration in time range)  levothyroxine (SYNTHROID) tablet 75 mcg (75 mcg Oral Given 07/30/21 0507)  dextrose 5 %-0.9 % sodium chloride infusion ( Intravenous New Bag/Given 07/30/21 0840)  potassium PHOSPHATE 30 mmol in dextrose 5 % 500 mL infusion (has no administration in time range)  lactated ringers bolus 2,000 mL (0 mLs Intravenous Stopped 07/29/21 1715)  magnesium sulfate IVPB 2 g 50 mL (2 g Intravenous New Bag/Given 07/30/21 1128)    Mobility walks High fall risk   Focused Assessments Neuro Assessment Handoff:  Swallow screen pass? Yes    NIH Stroke Scale ( + Modified Stroke Scale Criteria)  Interval: Initial Level of Consciousness (1a.)   : Alert, keenly responsive LOC Questions (1b. )   +: Answers both questions correctly LOC Commands (1c. )   + : Performs both tasks correctly Best Gaze (2. )  +: Normal Visual (3. )  +: No visual loss Facial Palsy (4. )    : Normal symmetrical movements Motor Arm, Left (5a. )   +: No drift Motor Arm, Right (5b. )   +: No drift Motor Leg, Left (6a. )   +: No drift Motor Leg, Right (6b. )   +: No drift Limb Ataxia (7. ): Absent Sensory (8. )   +: Normal, no sensory loss Best Language (9. )   +: No aphasia Dysarthria (10. ): Normal Extinction/Inattention (11.)   +: No Abnormality Modified SS Total  +: 0 Complete NIHSS TOTAL: 0     Neuro Assessment: Exceptions to [facility] Neuro Checks:   Initial (07/29/21 2048)  Last Documented NIHSS Modified Score: 0  (07/29/21 2048) Has TPA been given? No If patient is a Neuro Trauma and patient is going to OR before floor call report to [facility] nurse: [phone] or [phone]   R Recommendations: See Admitting Provider Note

## 2021-07-30 NOTE — Progress Notes (Signed)
Inpatient Diabetes Program Recommendations  AACE/ADA: New Consensus Statement on Inpatient Glycemic Control (2015)  Target Ranges:  Prepandial:   less than 140 mg/dL      Peak postprandial:   less than 180 mg/dL (1-2 hours)      Critically ill patients:  140 - 180 mg/dL   Lab Results  Component Value Date   GLUCAP 199 (H) 07/30/2021   HGBA1C 8.0 (H) 08/24/2017    Review of Glycemic Control  Diabetes history: DM2 Outpatient Diabetes medications: Basaglar 35 units QD, metformin 100 mg BID (not taking regularly), Januvia 100 mg QD (not taking) Current orders for Inpatient glycemic control: IV insulin transitioning to Semglee 30 units QD, Novolog 0-9 units TID.  HgbA1C pending  Inpatient Diabetes Program Recommendations:    Agree with orders. May need rapid-acting insulin for home.  Spoke with pt at bedside regarding her diabetes control. Pt states she DOES take her insulin, although doesn't take metformin and Januvia. Per husband, she does not take orals. Discussed importance of controlling blood sugars to prevent complications. Sees PCP on a regular basis and monitors blood sugars 1x/day. Encouraged pt to monitor more frequently and f/u with PCP with blood sugar log. Answered questions.  Thank you. Lorenda Peck, RD, LDN, CDE Inpatient Diabetes Coordinator [phone]

## 2021-07-31 ENCOUNTER — Inpatient Hospital Stay (HOSPITAL_COMMUNITY): Payer: Medicare HMO

## 2021-07-31 DIAGNOSIS — E785 Hyperlipidemia, unspecified: Secondary | ICD-10-CM | POA: Diagnosis not present

## 2021-07-31 DIAGNOSIS — E1110 Type 2 diabetes mellitus with ketoacidosis without coma: Secondary | ICD-10-CM | POA: Diagnosis not present

## 2021-07-31 DIAGNOSIS — I159 Secondary hypertension, unspecified: Secondary | ICD-10-CM | POA: Diagnosis not present

## 2021-07-31 DIAGNOSIS — N179 Acute kidney failure, unspecified: Secondary | ICD-10-CM | POA: Diagnosis not present

## 2021-07-31 LAB — CBC
HCT: 32 % — ABNORMAL LOW (ref 36.0–46.0)
Hemoglobin: 10.5 g/dL — ABNORMAL LOW (ref 12.0–15.0)
MCH: 31.3 pg (ref 26.0–34.0)
MCHC: 32.8 g/dL (ref 30.0–36.0)
MCV: 95.2 fL (ref 80.0–100.0)
Platelets: 135 10*3/uL — ABNORMAL LOW (ref 150–400)
RBC: 3.36 MIL/uL — ABNORMAL LOW (ref 3.87–5.11)
RDW: 15.3 % (ref 11.5–15.5)
WBC: 5.1 10*3/uL (ref 4.0–10.5)
nRBC: 0 % (ref 0.0–0.2)

## 2021-07-31 LAB — BASIC METABOLIC PANEL
Anion gap: 15 (ref 5–15)
BUN: 17 mg/dL (ref 8–23)
CO2: 21 mmol/L — ABNORMAL LOW (ref 22–32)
Calcium: 8 mg/dL — ABNORMAL LOW (ref 8.9–10.3)
Chloride: 100 mmol/L (ref 98–111)
Creatinine, Ser: 0.92 mg/dL (ref 0.44–1.00)
GFR, Estimated: >60 mL/min (ref >60)
Glucose, Bld: 429 mg/dL — ABNORMAL HIGH (ref 70–99)
Potassium: 3.2 mmol/L — ABNORMAL LOW (ref 3.5–5.1)
Sodium: 136 mmol/L (ref 135–145)

## 2021-07-31 LAB — GLUCOSE, CAPILLARY
Glucose-Capillary: 243 mg/dL — ABNORMAL HIGH (ref 70–99)
Glucose-Capillary: 257 mg/dL — ABNORMAL HIGH (ref 70–99)
Glucose-Capillary: 304 mg/dL — ABNORMAL HIGH (ref 70–99)
Glucose-Capillary: 315 mg/dL — ABNORMAL HIGH (ref 70–99)
Glucose-Capillary: 460 mg/dL — ABNORMAL HIGH (ref 70–99)

## 2021-07-31 LAB — MAGNESIUM: Magnesium: 2.2 mg/dL (ref 1.7–2.4)

## 2021-07-31 LAB — PHOSPHORUS: Phosphorus: 2.1 mg/dL — ABNORMAL LOW (ref 2.5–4.6)

## 2021-07-31 LAB — HEMOGLOBIN A1C
Hgb A1c MFr Bld: >15.5 % — ABNORMAL HIGH (ref 4.8–5.6)
Mean Plasma Glucose: >398 mg/dL

## 2021-07-31 MED ORDER — POTASSIUM PHOSPHATES 15 MMOLE/5ML IV SOLN
30.0000 mmol | Freq: Once | INTRAVENOUS | Status: AC
  Administered 2021-07-31: 30 mmol via INTRAVENOUS
  Filled 2021-07-31: qty 10

## 2021-07-31 MED ORDER — INSULIN ASPART 100 UNIT/ML IJ SOLN
6.0000 [IU] | Freq: Three times a day (TID) | INTRAMUSCULAR | Status: DC
  Administered 2021-07-31 (×3): 6 [IU] via SUBCUTANEOUS

## 2021-07-31 MED ORDER — INSULIN GLARGINE-YFGN 100 UNIT/ML ~~LOC~~ SOLN
42.0000 [IU] | Freq: Every day | SUBCUTANEOUS | Status: DC
Start: 2021-07-31 — End: 2021-08-01
  Administered 2021-07-31: 42 [IU] via SUBCUTANEOUS
  Filled 2021-07-31 (×2): qty 0.42

## 2021-07-31 MED ORDER — ROSUVASTATIN CALCIUM 20 MG PO TABS
20.0000 mg | ORAL_TABLET | Freq: Every day | ORAL | Status: DC
Start: 2021-07-31 — End: 2021-08-05
  Administered 2021-07-31 – 2021-08-05 (×6): 20 mg via ORAL
  Filled 2021-07-31 (×6): qty 1

## 2021-07-31 NOTE — Progress Notes (Signed)
Nutrition Education Note  RD received a consult for diet education.  Pt unavailable at time of RD visit. RD left "Plate Method for Diabetics" at bedside and added additional educational material to AVS. Will attempt in person education at later time.  Pt currently on a Heart Healthy/Carb Modified diet, no meal completions have been documented. Labs and medications reviewed. No weight loss noted within the past year per EMR.  No additional nutrition interventions warranted at this time. Please re-consult RD as needed.    Hermina Barters RD, LDN Clinical Dietitian See Shea Evans for contact information.

## 2021-07-31 NOTE — NC FL2 (Signed)
[place] LEVEL OF CARE SCREENING TOOL     IDENTIFICATION  Patient Name: Jennifer Boyer Birthdate: 11/25/1940 Sex: female Admission Date (Current Location): 07/29/2021  [facility] and [place] Number:  Herbalist and Address:  [facility], [place] [address], [place],  [address]      Provider Number: [id number]  Attending Physician Name and Address:  Jonetta Osgood, MD  Relative Name and Phone Number:       Current Level of Care: Hospital Recommended Level of Care: [place] Prior Approval Number:    Date Approved/Denied:   PASRR Number: [id number]  Discharge Plan: SNF    Current Diagnoses: Patient Active Problem List   Diagnosis Date Noted   DKA, type 2 ([place]) 07/29/2021   Macrocytic anemia 07/29/2021   Hyperkalemia 07/29/2021   AKI (acute kidney injury) ([place]) 07/29/2021   HTN (hypertension) 07/29/2021   Hypothyroidism 07/29/2021   HLD (hyperlipidemia) 07/29/2021   HNP (herniated nucleus pulposus), lumbar 03/24/2017    Orientation RESPIRATION BLADDER Height & Weight     Self, Place  Normal Incontinent Weight: 230 lb (104.3 kg) Height:  '5\' 7"'$  (170.2 cm)  BEHAVIORAL SYMPTOMS/MOOD NEUROLOGICAL BOWEL NUTRITION STATUS      Incontinent Diet (See dc summary)  AMBULATORY STATUS COMMUNICATION OF NEEDS Skin   Limited Assist Verbally PU Stage and Appropriate Care (Stage II on coccyx)                       Personal Care Assistance Level of Assistance  Bathing, Feeding, Dressing Bathing Assistance: Limited assistance Feeding assistance: Limited assistance Dressing Assistance: Limited assistance     Functional Limitations Info             [place]  PT (By licensed PT), OT (By licensed OT)     PT Frequency: 5x/week OT Frequency: 5x/week            Contractures Contractures Info: Not present    Additional Factors Info  Code Status, Allergies, Insulin Sliding  Scale Code Status Info: Full Allergies Info: NKA   Insulin Sliding Scale Info: See dc summary       Current Medications (07/31/2021):  This is the current hospital active medication list Current Facility-Administered Medications  Medication Dose Route Frequency Provider Last Rate Last Admin   dextrose 50 % solution 0-50 mL  0-50 mL Intravenous PRN Isla Pence, MD       enoxaparin (LOVENOX) injection 40 mg  40 mg Subcutaneous Q24H Dwyane Dee, MD   40 mg at 07/31/21 1719   hydrALAZINE (APRESOLINE) injection 10 mg  10 mg Intravenous Q4H PRN Dwyane Dee, MD       insulin aspart (novoLOG) injection 0-5 Units  0-5 Units Subcutaneous QHS Irene Pap N, DO   4 Units at 07/30/21 2349   insulin aspart (novoLOG) injection 0-9 Units  0-9 Units Subcutaneous TID WC Jonetta Osgood, MD   5 Units at 07/31/21 1719   insulin aspart (novoLOG) injection 6 Units  6 Units Subcutaneous TID WC Jonetta Osgood, MD   6 Units at 07/31/21 1719   insulin glargine-yfgn (SEMGLEE) injection 42 Units  42 Units Subcutaneous Daily Jonetta Osgood, MD   42 Units at 07/31/21 1119   labetalol (NORMODYNE) injection 10 mg  10 mg Intravenous Q4H PRN Dwyane Dee, MD       levothyroxine (SYNTHROID) tablet 75 mcg  75 mcg Oral Q0600 Dwyane Dee, MD  75 mcg at 07/31/21 0616   ondansetron (ZOFRAN) injection 4 mg  4 mg Intravenous Q6H PRN Jonetta Osgood, MD   4 mg at 07/30/21 1856   rosuvastatin (CRESTOR) tablet 20 mg  20 mg Oral Daily Jonetta Osgood, MD   20 mg at 07/31/21 1537     Discharge Medications: Please see discharge summary for a list of discharge medications.  Relevant Imaging Results:  Relevant Lab Results:   Additional Information SSN: [id number]. [place] COVID-19 Vaccine 09/19/2019 , 02/26/2019 , 02/05/2019  Benard Halsted, LCSW

## 2021-07-31 NOTE — Progress Notes (Addendum)
PROGRESS NOTE        PATIENT DETAILS Name: Jennifer Boyer Age: 81 y.o. Sex: female Date of Birth: September 28, 1940 Admit Date: 07/29/2021 Admitting Physician Dwyane Dee, MD RDE:YCXK, Edwyna Shell, MD (Inactive)  Brief Summary: Patient is a 81 y.o.  female with history of DM-2, HTN, HLD, medication noncompliance-who presented with polydipsia/polyuria/confusion-found to have DKA and subsequently admitted to the hospitalist service.  Significant events: 7/11>> admit to [facility]-in DKA/encephalopathy.  Significant studies: 7/11>>CXR: No PNA 7/11>> CT head: No acute intracranial abnormality. 7/12>> MRI brain: No acute intracranial abnormality.  Significant microbiology data: None  Procedures: None  Consults: Neurology  Subjective: Much more awake and alert-answers all my questions appropriately.  Objective: Vitals: Blood pressure (!) 131/57, pulse 76, temperature 98.6 F (37 C), temperature source Oral, resp. rate 16, height '5\' 7"'$  (1.702 m), weight 104.3 kg, SpO2 96 %.   Exam: Gen Exam:Alert awake-not in any distress HEENT:atraumatic, normocephalic Chest: B/L clear to auscultation anteriorly CVS:S1S2 regular Abdomen:soft non tender, non distended Extremities:no edema Neurology: Non focal Skin: no rash   Pertinent Labs/Radiology:    Latest Ref Rng & Units 07/31/2021   12:22 AM 07/30/2021    1:57 AM 07/29/2021    3:50 PM  CBC  WBC 4.0 - 10.5 K/uL 5.1  7.6    Hemoglobin 12.0 - 15.0 g/dL 10.5  10.6  14.6   Hematocrit 36.0 - 46.0 % 32.0  32.0  43.0   Platelets 150 - 400 K/uL 135  178      Lab Results  Component Value Date   NA 136 07/31/2021   K 3.2 (L) 07/31/2021   CL 100 07/31/2021   CO2 21 (L) 07/31/2021    Assessment/Plan: DKA: Due to noncompliance-resolved.  AKI: Due to osmotic diuresis in the setting of DKA--saline lock all IVF.  Hyperkalemia: Due to DKA/AKI-resolved.  Acute metabolic encephalopathy: Due to DKA-significant  improvement-asuspect she is back to her baseline.  MRI brain negative-no further work-up recommended by neurology (note-CVA ruled out)  Hypokalemia/hypophosphatemia/hypomagnesemia: Continue to replete and recheck.  DM-2: CBGs significantly elevated this morning-increased Semglee to 42 units daily-added 6 units of NovoLog with meals.  Reassess tomorrow.  HTN: BP stable without the use of any antihypertensives-resume when able.  Hypothyroidism: Continue Synthroid.  TSH done on 7/11 stable.  HLD: Resume statin   Macrocytic anemia: Folate/B12 level stable-PCP to monitor further.  Deconditioning: Obtain PT/OT.  Noncompliance: Spoke with son on 7/12-and subsequently with daughter on 7/13-patient has been very poorly compliant with medications.  They are also suggesting that patient may have developed some cognitive issues-as numerous bills have been unpaid.  Have recommended outpatient neurocognitive evaluation-we will place a epic referral to neurology.  Code status:   Code Status: Full Code   DVT Prophylaxis: enoxaparin (LOVENOX) injection 40 mg Start: 07/30/21 1700   Family Communication: Son-Robert-[phone]-(who connected with his sister)-both updated extensively on 7/13. Marland Kitchen   Disposition Plan: Status is: Inpatient Remains inpatient appropriate because: Optimize diabetic control (CBG reading> 400 this morning)-await PT/OT evaluation to determine safe disposition.   Planned Discharge Destination:Home health versus SNF.   Diet: Diet Order             Diet full liquid Room service appropriate? Yes; Fluid consistency: Thin  Diet effective now  Antimicrobial agents: Anti-infectives (From admission, onward)    None        MEDICATIONS: Scheduled Meds:  enoxaparin (LOVENOX) injection  40 mg Subcutaneous Q24H   insulin aspart  0-5 Units Subcutaneous QHS   insulin aspart  0-9 Units Subcutaneous TID WC   insulin aspart  6 Units Subcutaneous TID  WC   insulin glargine-yfgn  42 Units Subcutaneous Daily   levothyroxine  75 mcg Oral Q0600   Continuous Infusions:  potassium PHOSPHATE IVPB (in mmol) 30 mmol (07/31/21 0924)   sodium bicarbonate 150 mEq in sterile water 1,150 mL infusion 100 mL/hr at 07/30/21 0606   PRN Meds:.dextrose, hydrALAZINE, labetalol, ondansetron (ZOFRAN) IV   I have personally reviewed following labs and imaging studies  LABORATORY DATA: CBC: Recent Labs  Lab 07/29/21 1453 07/29/21 1540 07/29/21 1550 07/30/21 0157 07/31/21 0022  WBC 9.1  --   --  7.6 5.1  NEUTROABS 7.8*  --   --  5.2  --   HGB 13.3 15.3* 14.6 10.6* 10.5*  HCT 44.7 45.0 43.0 32.0* 32.0*  MCV 107.5*  --   --  97.3 95.2  PLT 231  --   --  178 135*     Basic Metabolic Panel: Recent Labs  Lab 07/29/21 1453 07/29/21 1540 07/29/21 1550 07/29/21 2222 07/30/21 0157 07/30/21 1110 07/31/21 0022  NA 138   < > 139 136 136 137 136  K 4.9   < > 4.9 3.4* 3.4* 3.0* 3.2*  CL 105  --   --  106 106 107 100  CO2 <7*  --   --  10* 15* 23 21*  GLUCOSE 555*  --   --  156* 135* 155* 429*  BUN 43*  --   --  36* 32* 27* 17  CREATININE 1.75*  --   --  1.33* 1.17* 0.86 0.92  CALCIUM 8.7*  --   --  8.5* 7.9* 7.9* 8.0*  MG  --   --   --  1.7 1.7 1.8 2.2  PHOS  --   --   --  1.6* 1.3* 1.3* 2.1*   < > = values in this interval not displayed.     GFR: Estimated Creatinine Clearance: 60.6 mL/min (by C-G formula based on SCr of 0.92 mg/dL).  Liver Function Tests: No results for input(s): "AST", "ALT", "ALKPHOS", "BILITOT", "PROT", "ALBUMIN" in the last 168 hours. No results for input(s): "LIPASE", "AMYLASE" in the last 168 hours. No results for input(s): "AMMONIA" in the last 168 hours.  Coagulation Profile: No results for input(s): "INR", "PROTIME" in the last 168 hours.  Cardiac Enzymes: No results for input(s): "CKTOTAL", "CKMB", "CKMBINDEX", "TROPONINI" in the last 168 hours.  BNP (last 3 results) No results for input(s): "PROBNP" in  the last 8760 hours.  Lipid Profile: No results for input(s): "CHOL", "HDL", "LDLCALC", "TRIG", "CHOLHDL", "LDLDIRECT" in the last 72 hours.  Thyroid Function Tests: Recent Labs    07/29/21 2222  TSH 2.073     Anemia Panel: Recent Labs    07/29/21 2222  VITAMINB12 456  FOLATE 13.9     Urine analysis:    Component Value Date/Time   COLORURINE YELLOW 07/29/2021 1517   APPEARANCEUR HAZY (A) 07/29/2021 1517   LABSPEC 1.021 07/29/2021 1517   PHURINE 5.0 07/29/2021 1517   GLUCOSEU >=500 (A) 07/29/2021 1517   HGBUR NEGATIVE 07/29/2021 1517   BILIRUBINUR NEGATIVE 07/29/2021 1517   KETONESUR 80 (A) 07/29/2021 1517   PROTEINUR 30 (A) 07/29/2021 1517  NITRITE NEGATIVE 07/29/2021 1517   LEUKOCYTESUR NEGATIVE 07/29/2021 1517    Sepsis Labs: Lactic Acid, Venous No results found for: "LATICACIDVEN"  MICROBIOLOGY: No results found for this or any previous visit (from the past 240 hour(s)).  RADIOLOGY STUDIES/RESULTS: MR BRAIN WO CONTRAST  Result Date: 07/31/2021 CLINICAL DATA:  81 year old female code stroke presentation 2 days ago. Altered mental status. EXAM: MRI HEAD WITHOUT CONTRAST TECHNIQUE: Multiplanar, multiecho pulse sequences of the brain and surrounding structures were obtained without intravenous contrast. COMPARISON:  Head CT 07/29/2021.  Brain MRI 03/02/2008. FINDINGS: Brain: Overall cerebral volume is normal for age. No restricted diffusion to suggest acute infarction. No midline shift, mass effect, evidence of mass lesion, ventriculomegaly, extra-axial collection or acute intracranial hemorrhage. Cervicomedullary junction and pituitary are within normal limits. Pearline Cables and white matter signal throughout the brain remains normal for age, not significantly changed from 2010. No definite cortical encephalomalacia or chronic cerebral blood products. Vascular: Major intracranial vascular flow voids are stable since 2010. Skull and upper cervical spine: Negative visible  cervical spine. Visualized bone marrow signal is within normal limits. Sinuses/Orbits: Negative orbits. Paranasal Visualized paranasal sinuses and mastoids are stable and well aerated. Other: Grossly normal visible internal auditory structures. Negative visible scalp and face. IMPRESSION: No acute intracranial abnormality and normal for age noncontrast MRI appearance of the brain. Electronically Signed   By: Genevie Ann M.D.   On: 07/31/2021 11:11   CT HEAD CODE STROKE WO CONTRAST  Result Date: 07/29/2021 CLINICAL DATA:  Code stroke. Initial evaluation for neuro deficit, stroke suspected EXAM: CT HEAD WITHOUT CONTRAST TECHNIQUE: Contiguous axial images were obtained from the base of the skull through the vertex without intravenous contrast. RADIATION DOSE REDUCTION: This exam was performed according to the departmental dose-optimization program which includes automated exposure control, adjustment of the mA and/or kV according to patient size and/or use of iterative reconstruction technique. COMPARISON:  Prior study from 03/02/2008. FINDINGS: Brain: Cerebral volume within normal limits. No acute intracranial hemorrhage. No acute large vessel territory infarct. No mass lesion or midline shift. No hydrocephalus or extra-axial fluid collection. Vascular: No hyperdense vessel. Skull: Scalp soft tissues and calvarium within normal limits. Sinuses/Orbits: Globes and orbital soft tissues within normal limits. Paranasal sinuses are clear. No significant mastoid effusion. Other: None. ASPECTS [facility] Stroke Program Early CT Score) - Ganglionic level infarction (caudate, lentiform nuclei, internal capsule, insula, M1-M3 cortex): 7 - Supraganglionic infarction (M4-M6 cortex): 3 Total score (0-10 with 10 being normal): 10 IMPRESSION: 1. Negative head CT.  No acute intracranial abnormality. 2. ASPECTS is 10. These results were communicated to Dr. Leonel Ramsay at 9:13 pm on 07/29/2021 by text page via the [facility] messaging system.  Electronically Signed   By: Jeannine Boga M.D.   On: 07/29/2021 21:14   DG Chest Portable 1 View  Result Date: 07/29/2021 CLINICAL DATA:  Altered mental status EXAM: PORTABLE CHEST 1 VIEW COMPARISON:  None Available. FINDINGS: The cardiomediastinal silhouette is within normal limits. There is no focal airspace disease. There is no pleural effusion. No pneumothorax. There is no acute osseous abnormality. Thoracic spondylosis. IMPRESSION: No evidence of acute cardiopulmonary disease. Electronically Signed   By: Maurine Simmering M.D.   On: 07/29/2021 14:57     LOS: 2 days   Oren Binet, MD  [facility]    To contact the attending provider between 7A-7P or the covering provider during after hours 7P-7A, please log into the web site [email or web address] and access using universal [place] password for that web  site. If you do not have the password, please call the hospital operator.  07/31/2021, 1:31 PM

## 2021-07-31 NOTE — Discharge Instructions (Addendum)
Follow with Primary MD Vernie Shanks, MD (Inactive) in 7 days   Get CBC, CMP, Magnesium, 2 view Chest X ray -  checked next visit within 1 week by   SNF MD    Activity: As tolerated with Full fall precautions use walker/cane & assistance as needed  Disposition SNF  Diet: Heart Healthy low carbohydrate diet.  Check CBGs q. [place].  Special Instructions: If you have smoked or chewed Tobacco  in the last 2 yrs please stop smoking, stop any regular Alcohol  and or any Recreational drug use.  On your next visit with your primary care physician please Get Medicines reviewed and adjusted.  Please request your Prim.MD to go over all Hospital Tests and Procedure/Radiological results at the follow up, please get all Hospital records sent to your Prim MD by signing hospital release before you go home.  If you experience worsening of your admission symptoms, develop shortness of breath, life threatening emergency, suicidal or homicidal thoughts you must seek medical attention immediately by calling 911 or calling your MD immediately  if symptoms less severe.  You Must read complete instructions/literature along with all the possible adverse reactions/side effects for all the Medicines you take and that have been prescribed to you. Take any new Medicines after you have completely understood and accpet all the possible adverse reactions/side effects.      Carbohydrate Counting For People With Diabetes  Foods with carbohydrates make your blood glucose level go up. Learning how to count carbohydrates can help you control your blood glucose levels. First, identify the foods you eat that contain carbohydrates. Then, using the Foods with Carbohydrates chart, determine about how much carbohydrates are in your meals and snacks. Make sure you are eating foods with fiber, protein, and healthy fat along with your carbohydrate foods. Foods with Carbohydrates The following table shows carbohydrate foods that have  about 15 grams of carbohydrate each. Using measuring cups, spoons, or a food scale when you first begin learning about carbohydrate counting can help you learn about the portion sizes you typically eat. The following foods have 15 grams carbohydrate each:  Grains 1 slice bread (1 ounce)  1 small tortilla (6-inch size)   large bagel (1 ounce)  1/3 cup pasta or rice (cooked)   hamburger or hot dog bun ( ounce)   cup cooked cereal   to  cup ready-to-eat cereal  2 taco shells (5-inch size) Fruit 1 small fresh fruit ( to 1 cup)   medium banana  17 small grapes (3 ounces)  1 cup melon or berries   cup canned or frozen fruit  2 tablespoons dried fruit (blueberries, cherries, cranberries, raisins)   cup unsweetened fruit juice  Starchy Vegetables  cup cooked beans, peas, corn, potatoes/sweet potatoes   large baked potato (3 ounces)  1 cup acorn or butternut squash  Snack Foods 3 to 6 crackers  8 potato chips or 13 tortilla chips ( ounce to 1 ounce)  3 cups popped popcorn  Dairy 3/4 cup (6 ounces) nonfat plain yogurt, or yogurt with sugar-free sweetener  1 cup milk  1 cup plain rice, soy, coconut or flavored almond milk Sweets and Desserts  cup ice cream or frozen yogurt  1 tablespoon jam, jelly, pancake syrup, table sugar, or honey  2 tablespoons light pancake syrup  1 inch square of frosted cake or 2 inch square of unfrosted cake  2 small cookies (2/3 ounce each) or  large cookie  Sometimes you'll have  to estimate carbohydrate amounts if you don't know the exact recipe. One cup of mixed foods like soups can have 1 to 2 carbohydrate servings, while some casseroles might have 2 or more servings of carbohydrate. Foods that have less than 20 calories in each serving can be counted as "free" foods. Count 1 cup raw vegetables, or  cup cooked non-starchy vegetables as "free" foods. If you eat 3 or more servings at one meal, then count them as 1 carbohydrate serving.  Foods  without Carbohydrates  Not all foods contain carbohydrates. Meat, some dairy, fats, non-starchy vegetables, and many beverages don't contain carbohydrate. So when you count carbohydrates, you can generally exclude chicken, pork, beef, fish, seafood, eggs, tofu, cheese, butter, sour cream, avocado, nuts, seeds, olives, mayonnaise, water, black coffee, unsweetened tea, and zero-calorie drinks. Vegetables with no or low carbohydrate include green beans, cauliflower, tomatoes, and onions. How much carbohydrate should I eat at each meal?  Carbohydrate counting can help you plan your meals and manage your weight. Following are some starting points for carbohydrate intake at each meal. Work with your registered dietitian nutritionist to find the best range that works for your blood glucose and weight.   To Lose Weight To Maintain Weight  Women 2 - 3 carb servings 3 - 4 carb servings  Men 3 - 4 carb servings 4 - 5 carb servings  Checking your blood glucose after meals will help you know if you need to adjust the timing, type, or number of carbohydrate servings in your meal plan. Achieve and keep a healthy body weight by balancing your food intake and physical activity.  Tips How should I plan my meals?  Plan for half the food on your plate to include non-starchy vegetables, like salad greens, broccoli, or carrots. Try to eat 3 to 5 servings of non-starchy vegetables every day. Have a protein food at each meal. Protein foods include chicken, fish, meat, eggs, or beans (note that beans contain carbohydrate). These two food groups (non-starchy vegetables and proteins) are low in carbohydrate. If you fill up your plate with these foods, you will eat less carbohydrate but still fill up your stomach. Try to limit your carbohydrate portion to  of the plate.  What fats are healthiest to eat?  Diabetes increases risk for heart disease. To help protect your heart, eat more healthy fats, such as olive oil, nuts, and  avocado. Eat less saturated fats like butter, cream, and high-fat meats, like bacon and sausage. Avoid trans fats, which are in all foods that list "partially hydrogenated oil" as an ingredient. What should I drink?  Choose drinks that are not sweetened with sugar. The healthiest choices are water, carbonated or seltzer waters, and tea and coffee without added sugars.  Sweet drinks will make your blood glucose go up very quickly. One serving of soda or energy drink is  cup. It is best to drink these beverages only if your blood glucose is low.  Artificially sweetened, or diet drinks, typically do not increase your blood glucose if they have zero calories in them. Read labels of beverages, as some diet drinks do have carbohydrate and will raise your blood glucose. Label Reading Tips Read Nutrition Facts labels to find out how many grams of carbohydrate are in a food you want to eat. Don't forget: sometimes serving sizes on the label aren't the same as how much food you are going to eat, so you may need to calculate how much carbohydrate is in the  food you are serving yourself.   Carbohydrate Counting for People with Diabetes Sample 1-Day Menu  Breakfast  cup yogurt, low fat, low sugar (1 carbohydrate serving)   cup cereal, ready-to-eat, unsweetened (1 carbohydrate serving)  1 cup strawberries (1 carbohydrate serving)   cup almonds ( carbohydrate serving)  Lunch 1, 5 ounce can chunk light tuna  2 ounces cheese, low fat cheddar  6 whole wheat crackers (1 carbohydrate serving)  1 small apple (1 carbohydrate servings)   cup carrots ( carbohydrate serving)   cup snap peas  1 cup 1% milk (1 carbohydrate serving)   Evening Meal Stir fry made with: 3 ounces chicken  1 cup brown rice (3 carbohydrate servings)   cup broccoli ( carbohydrate serving)   cup green beans   cup onions  1 tablespoon olive oil  2 tablespoons teriyaki sauce ( carbohydrate serving)  Evening Snack 1 extra small  banana (1 carbohydrate serving)  1 tablespoon peanut butter   Carbohydrate Counting for People with Diabetes Vegan Sample 1-Day Menu  Breakfast 1 cup cooked oatmeal (2 carbohydrate servings)   cup blueberries (1 carbohydrate serving)  2 tablespoons flaxseeds  1 cup soymilk fortified with calcium and vitamin D  1 cup coffee  Lunch 2 slices whole wheat bread (2 carbohydrate servings)   cup baked tofu   cup lettuce  2 slices tomato  2 slices avocado   cup baby carrots ( carbohydrate serving)  1 orange (1 carbohydrate serving)  1 cup soymilk fortified with calcium and vitamin D   Evening Meal Burrito made with: 1 6-inch corn tortilla (1 carbohydrate serving)  1 cup refried vegetarian beans (2 carbohydrate servings)   cup chopped tomatoes   cup lettuce   cup salsa  1/3 cup brown rice (1 carbohydrate serving)  1 tablespoon olive oil for rice   cup zucchini   Evening Snack 6 small whole grain crackers (1 carbohydrate serving)  2 apricots ( carbohydrate serving)   cup unsalted peanuts ( carbohydrate serving)    Carbohydrate Counting for People with Diabetes Vegetarian (Lacto-Ovo) Sample 1-Day Menu  Breakfast 1 cup cooked oatmeal (2 carbohydrate servings)   cup blueberries (1 carbohydrate serving)  2 tablespoons flaxseeds  1 egg  1 cup 1% milk (1 carbohydrate serving)  1 cup coffee  Lunch 2 slices whole wheat bread (2 carbohydrate servings)  2 ounces low-fat cheese   cup lettuce  2 slices tomato  2 slices avocado   cup baby carrots ( carbohydrate serving)  1 orange (1 carbohydrate serving)  1 cup unsweetened tea  Evening Meal Burrito made with: 1 6-inch corn tortilla (1 carbohydrate serving)   cup refried vegetarian beans (1 carbohydrate serving)   cup tomatoes   cup lettuce   cup salsa  1/3 cup brown rice (1 carbohydrate serving)  1 tablespoon olive oil for rice   cup zucchini  1 cup 1% milk (1 carbohydrate serving)  Evening Snack 6 small whole grain  crackers (1 carbohydrate serving)  2 apricots ( carbohydrate serving)   cup unsalted peanuts ( carbohydrate serving)    Copyright 2020  Academy of Nutrition and Dietetics. All rights reserved.  Using Nutrition Labels: Carbohydrate  Serving Size  Look at the serving size. All the information on the label is based on this portion. Servings Per Container  The number of servings contained in the package. Guidelines for Carbohydrate  Look at the total grams of carbohydrate in the serving size.  1 carbohydrate choice = 15 grams of  carbohydrate. Range of Carbohydrate Grams Per Choice  Carbohydrate Grams/Choice Carbohydrate Choices  6-10   11-20 1  21-25 1  26-35 2  36-40 2  41-50 3  51-55 3  56-65 4  66-70 4  71-80 5    Copyright 2020  Academy of Nutrition and Dietetics. All rights reserved. Plate Method for Diabetes   Foods with carbohydrates make your blood glucose level go up. The plate method is a simple way to meal plan and control the amount of carbohydrate you eat.         Use the following guidance to build a healthy plate to control carbohydrates. Divide a 9-inch plate into 3 sections, and consider your beverage the 4th section of your meal: Food Group Examples of Foods/Beverages for This Section of your Meal  Section 1: Non-starchy vegetables Fill  of your plate to include non-starchy vegetables Asparagus, broccoli, brussels sprouts, cabbage, carrots, cauliflower, celery, cucumber, green beans, mushrooms, peppers, salad greens, tomatoes, or zucchini.  Section 2: Protein foods Fill  of your plate to include a lean protein Lean meat, poultry, fish, seafood, cheese, eggs, lean deli meat, tofu, beans, lentils, nuts or nut butters.  Section 3: Carbohydrate foods Fill  of your plate to include carbohydrate foods Whole grains, whole wheat bread, brown rice, whole grain pasta, polenta, corn tortillas, fruit, or starchy vegetables (potatoes, green peas, corn, beans,  acorn squash, and butternut squash). One cup of milk also counts as a food that contains carbohydrate.  Section 4: Beverage Choose water or a low-calorie drink for your beverage. Unsweetened tea, coffee, or flavored/sparkling water without added sugar.  Image reprinted with permission from The American Diabetes Association.  Copyright 2022 by the American Diabetes Association.   Copyright 2022  Academy of Nutrition and Dietetics. All rights reserved

## 2021-07-31 NOTE — Plan of Care (Signed)

## 2021-07-31 NOTE — Progress Notes (Signed)
PT Cancellation Note  Patient Details Name: Jennifer Boyer MRN: [id number] DOB: 1940-10-18   Cancelled Treatment:    Reason Eval/Treat Not Completed: Patient at procedure or test/unavailable (MRI). PT will continue to f/u with pt acutely as available and appropriate.    Clearnce Sorrel Nayeliz Hipp 07/31/2021, 10:37 AM

## 2021-07-31 NOTE — TOC Initial Note (Signed)
Transition of Care [facility]) - Initial/Assessment Note    Patient Details  Name: Jennifer Boyer MRN: [id number] Date of Birth: 05-Jan-1941  [facility] of [facility]) CM/SW Contact:    Benard Halsted, LCSW Phone Number: 07/31/2021, 6:05 PM  Clinical Narrative:                 CSW received consult for possible SNF placement at time of discharge. CSW spoke with patient's daughter, Darrick Meigs, (lives in [place]). She reported that patient lives alone and has been having issues with incontinence and taking her medications. Daughter has a cleaning agency at the patient's house now. Daughter expressed understanding of PT recommendation and is agreeable to SNF placement at time of discharge. She stated patient's son lives locally and will be willing to drive further than [facility] if necessary. CSW discussed insurance authorization process and will provide Medicare SNF ratings list. Patient has received COVID vaccines. CSW will send out referrals for review.   Skilled Nursing Rehab Facilities-   [email or web address]   Ratings out of 5 possible   Name Address  Phone # [place] Inspection Overall  [facility] [address], [place] '4 5 2 3  '$ [facility]  [address] [place], [place] [phone] '3 2 5 5  '$ [facility] [place], [place] '3 1 1 1  '$ [facility], [place] '3 2 4 4  '$ [facility] [address], [place] '1 1 2 1  '$ [facility] '2 1 4 3  '$ [facility] [address], [place] '5 2 3 4  '$ [facility] [address], [place] '5 2 2 3  '$ [address] ([place]) [place], [place] [phone] '5 1 2 2  '$ [facility] [phone], [place] [phone] '4 1 2 1  '$ [facility] [address], [facility]  [phone] '4 1 2 1  '$ [facility] ([place]) [place]. [place], [place] [phone] '4 1 1 1  '$ [facility] [address] [phone] '3 2 4 4          '$ [place] '4 2 3 3  '$ [facility] [place] [address] [place] '4 1 5 4  '$ [facility], [place] [address], [place] [phone] '2 1 1 1  '$ [facility] [address] [phone] '2 1 3 2          '$ [address] (no [facility]) [place], [place] [phone] '4 5 5 5  '$ [facility] (No Humana) [address] [phone] [address], [place] '3 1 4 3  '$ [facility] (No UHC) [place], [place] '5 5 5 5  '$ [facility] ([address]) [address] [phone] '3 2 4 4  '$ [place] [address] [place] '1 1 2 1  '$ [facility] [address], [address] [place] '2 1 1 1  '$ [facility] [place], [place] '5 2 4 5  '$ [facility] [address] [place] '3 1 1 1  '$ [facility] [place], [place] '2 1 2 1          '$ [facility] [address] [place] '1 1 1 1  '$ [facility] [address] [place]  [phone]  $'2 4 2 2  'c$ [facility] [place] [address], [place] [phone] '5 2 3 4  '$ [facility] [place] [address], [place] '2 1 1 1  '$ [place] (No Humana) [address] [phone] '2 1 3 2  '$ [facility] [address], [place] [phone] '3 1 1 1          '$ [facility] [place], [place] '5 4 5 5  '$ [facility])  [facility] [address], [place] '2 2 3 3  '$ [facility]) [place], [place] '3 2 4 4  '$ [facility] [place] [address], [place] '4 3 4 4  '$ [address] [place], [place] '3 3 1 1  '$ [facility]) [address] [place] [phone] '2 2 4 4     '$ Expected Discharge Plan: [place] Barriers to Discharge: Insurance Authorization, Continued Medical Work up, SNF Pending bed offer   Patient Goals and CMS Choice Patient states their goals for this hospitalization and ongoing recovery are:: Rehab CMS Medicare.gov Compare Post Acute Care list provided to:: Patient Represenative (must comment) Choice offered to / list presented to : Adult Children  Expected Discharge Plan and Services Expected Discharge Plan: [place] In-house Referral: Clinical Social Work   Post Acute Care Choice: [place] Living arrangements for the past 2 months: [place]                                      Prior Living Arrangements/Services Living arrangements for the past 2 months: Single Family Home Lives with:: Self Patient language and need for interpreter reviewed:: Yes Do you feel safe going back to the place where you live?: Yes      Need for Family Participation in Patient Care: Yes (Comment) Care giver support system in place?: Yes (comment)   Criminal Activity/Legal Involvement Pertinent to Current Situation/Hospitalization: No - Comment as needed  Activities of Daily Living Home Assistive Devices/Equipment: None ADL Screening (condition at time of admission) Patient's cognitive ability adequate to safely complete daily activities?: Yes Is the patient deaf or have difficulty hearing?: No Does the patient have difficulty seeing, even when wearing glasses/contacts?: No Does the patient have difficulty concentrating, remembering, or making decisions?: Yes Patient able to express need for assistance with ADLs?: Yes Does the patient have difficulty dressing or bathing?: Yes Independently performs ADLs?: No Does the patient have  difficulty walking or climbing stairs?: Yes Weakness of Legs: Both Weakness of Arms/Hands: None  Permission Sought/Granted Permission sought to share information with : Facility [phone], Family Supports Permission granted to share information with : Yes, Verbal Permission Granted  Share Information with NAME: Sport and exercise psychologist  Permission granted to share info w AGENCY: SNFs  Permission granted to share info w Relationship: Daughter  Permission granted to share info w Contact Information: [phone]  Emotional Assessment Appearance:: Appears stated age Attitude/Demeanor/Rapport: Unable to Assess Affect (typically observed): Unable to Assess Orientation: : Oriented to Self, Oriented to Place Alcohol / Substance Use: Not Applicable Psych Involvement: No (comment)  Admission diagnosis:  AKI (acute kidney injury) ([place]) [N17.9] DKA, type 2 ([place]) [E11.10] Diabetic ketoacidosis without coma associated with type 2 diabetes mellitus ([place]) [E11.10] Patient Active Problem List   Diagnosis Date Noted   DKA, type 2 (  [place]) 07/29/2021   Macrocytic anemia 07/29/2021   Hyperkalemia 07/29/2021   AKI (acute kidney injury) ([place]) 07/29/2021   HTN (hypertension) 07/29/2021   Hypothyroidism 07/29/2021   HLD (hyperlipidemia) 07/29/2021   HNP (herniated nucleus pulposus), lumbar 03/24/2017   PCP:  Vernie Shanks, MD (Inactive) Pharmacy:   [facility] [address] [place] [address] [place] [address] Phone: [phone] Fax: [phone]     Social Determinants of Health (SDOH) Interventions    Readmission Risk Interventions     No data to display

## 2021-07-31 NOTE — Evaluation (Signed)
Physical Therapy Evaluation Patient Details Name: Jennifer Boyer MRN: [id number] DOB: Nov 02, 1940 Today's Date: 07/31/2021  History of Present Illness  81 y.o.  female with history of DM-2, HTN, HLD, medication noncompliance-who presented 07/30/21 with polydipsia/polyuria/confusion-found to have DKA with encephalopathy. CT head and MRI brain negative  Clinical Impression   Pt admitted secondary to problem above with deficits below. PTA patient was living alone and was independent with basic mobility. Noted she was having cognitive deficits with missing paying some bills and missing some of her medications.  Pt currently requires mod assist to sit at EOB and min assist to stand at EOB. Patient with ant-post wavering while standing and remained lethargic, therefore unsafe to attempt ambulation. Anticipate patient will benefit from PT to address problems listed below.Will continue to follow acutely to maximize functional mobility independence and safety.          Recommendations for follow up therapy are one component of a multi-disciplinary discharge planning process, led by the attending physician.  Recommendations may be updated based on patient status, additional functional criteria and insurance authorization.  Follow Up Recommendations Skilled nursing-short term rehab (<3 hours/day) Can patient physically be transported by private vehicle: No    Assistance Recommended at Discharge Frequent or constant Supervision/Assistance  Patient can return home with the following  Two people to help with walking and/or transfers;Assistance with cooking/housework;Direct supervision/assist for medications management;Direct supervision/assist for financial management;Assist for transportation;Help with stairs or ramp for entrance    Equipment Recommendations Rolling walker (2 wheels)  Recommendations for Other Services  OT consult    Functional Status Assessment Patient has had a recent decline in their  functional status and demonstrates the ability to make significant improvements in function in a reasonable and predictable amount of time.     Precautions / Restrictions Precautions Precautions: Fall      Mobility  Bed Mobility Overal bed mobility: Needs Assistance Bed Mobility: Rolling, Sidelying to Sit, Sit to Sidelying Rolling: Min assist Sidelying to sit: Mod assist, HOB elevated     Sit to sidelying: Min assist General bed mobility comments: pt unable to initiate coming to sit on EOB; rolled with cues and rail; up to sit with cues and mod assist; return to bed with assist for legas    Transfers Overall transfer level: Needs assistance Equipment used: 1 person hand held assist Transfers: Sit to/from Stand Sit to Stand: Min assist           General transfer comment: pt reachign for UE support as coming to stand    Ambulation/Gait             Pre-gait activities: side step toward HOB very small shuffling steps with +1 HHA/min assist General Gait Details: off balance in static standing and remained lethargic with unsafe to attmept ambulation with +1 assist  Stairs            Wheelchair Mobility    Modified Rankin (Stroke Patients Only)       Balance Overall balance assessment: Needs assistance Sitting-balance support: No upper extremity supported, Feet supported Sitting balance-Leahy Scale: Fair     Standing balance support: Single extremity supported Standing balance-Leahy Scale: Poor Standing balance comment: ant-post wavering while standing; decr arousal                             Pertinent Vitals/Pain Pain Assessment Pain Assessment: No/denies pain    Home Living Family/patient expects to be  discharged to:: Private residence Living Arrangements: Alone Available Help at Discharge: Family;Available PRN/intermittently Type of Home: House Home Access: Level entry       Home Layout: One level Home Equipment: Cane - single  point      Prior Function Prior Level of Function : Independent/Modified Independent             Mobility Comments: not using a device       Hand Dominance   Dominant Hand: Right    Extremity/Trunk Assessment   Upper Extremity Assessment Upper Extremity Assessment: Defer to OT evaluation    Lower Extremity Assessment Lower Extremity Assessment: Generalized weakness    Cervical / Trunk Assessment Cervical / Trunk Assessment: Other exceptions Cervical / Trunk Exceptions: overweight  Communication   Communication: No difficulties  Cognition Arousal/Alertness: Lethargic Behavior During Therapy: Flat affect Overall Cognitive Status: Impaired/Different from baseline Area of Impairment: Orientation, Following commands, Awareness, Problem solving                 Orientation Level: Time, Situation     Following Commands: Follows one step commands with increased time   Awareness:  (pre-intellectual) Problem Solving: Slow processing, Decreased initiation, Difficulty sequencing, Requires verbal cues, Requires tactile cues General Comments: required step by step cues for rolling and side to sit; same for return to bed        General Comments General comments (skin integrity, edema, etc.): Son and daughter present and report she will be going home alone. Dtr lives in [place] and son lives 20 minutes away.    Exercises     Assessment/Plan    PT Assessment Patient needs continued PT services  PT Problem List Decreased strength;Decreased activity tolerance;Decreased balance;Decreased mobility;Decreased cognition;Decreased knowledge of use of DME;Decreased safety awareness;Obesity       PT Treatment Interventions DME instruction;Gait training;Functional mobility training;Therapeutic activities;Therapeutic exercise;Balance training;Cognitive remediation;Patient/family education    PT Goals (Current goals can be found in the Care Plan section)  Acute Rehab PT  Goals Patient Stated Goal: to return home PT Goal Formulation: With patient/family Time For Goal Achievement: 08/14/21 Potential to Achieve Goals: Good    Frequency Min 3X/week     Co-evaluation               AM-PAC PT "6 Clicks" Mobility  Outcome Measure Help needed turning from your back to your side while in a flat bed without using bedrails?: A Little Help needed moving from lying on your back to sitting on the side of a flat bed without using bedrails?: A Lot Help needed moving to and from a bed to a chair (including a wheelchair)?: A Lot Help needed standing up from a chair using your arms (e.g., wheelchair or bedside chair)?: Total Help needed to walk in hospital room?: Total Help needed climbing 3-5 steps with a railing? : Total 6 Click Score: 10    End of Session Equipment Utilized During Treatment: Gait belt Activity Tolerance: Patient limited by lethargy Patient left: in bed;with call bell/phone within reach;with bed alarm set Nurse Communication: Mobility status;Other (comment) (need for SNF) PT Visit Diagnosis: Unsteadiness on feet (R26.81);Difficulty in walking, not elsewhere classified (R26.2)    Time: 1355-1415 PT Time Calculation (min) (ACUTE ONLY): 20 min   Charges:   PT Evaluation $PT Eval Low Complexity: [place], PT Acute Rehabilitation Services  Office [phone]   Rexanne Mano 07/31/2021, 2:31 PM

## 2021-07-31 NOTE — Plan of Care (Signed)
Following up on Dr. Cecil Cobbs recommendations-MRI of the brain completed.  No acute stroke.  No further inpatient work-up or intervention at this time.  -- Amie Portland, MD Neurologist [facility] Pager: [phone]

## 2021-08-01 DIAGNOSIS — N179 Acute kidney failure, unspecified: Secondary | ICD-10-CM | POA: Diagnosis not present

## 2021-08-01 DIAGNOSIS — I159 Secondary hypertension, unspecified: Secondary | ICD-10-CM | POA: Diagnosis not present

## 2021-08-01 DIAGNOSIS — E785 Hyperlipidemia, unspecified: Secondary | ICD-10-CM | POA: Diagnosis not present

## 2021-08-01 DIAGNOSIS — E1110 Type 2 diabetes mellitus with ketoacidosis without coma: Secondary | ICD-10-CM | POA: Diagnosis not present

## 2021-08-01 LAB — GLUCOSE, CAPILLARY: Glucose-Capillary: 259 mg/dL — ABNORMAL HIGH (ref 70–99)

## 2021-08-01 LAB — COMPREHENSIVE METABOLIC PANEL
ALT: 12 U/L (ref 0–44)
AST: 16 U/L (ref 15–41)
Albumin: 2.4 g/dL — ABNORMAL LOW (ref 3.5–5.0)
Alkaline Phosphatase: 56 U/L (ref 38–126)
Anion gap: 5 (ref 5–15)
BUN: 10 mg/dL (ref 8–23)
CO2: 28 mmol/L (ref 22–32)
Calcium: 8.4 mg/dL — ABNORMAL LOW (ref 8.9–10.3)
Chloride: 103 mmol/L (ref 98–111)
Creatinine, Ser: 0.72 mg/dL (ref 0.44–1.00)
GFR, Estimated: >60 mL/min (ref >60)
Glucose, Bld: 210 mg/dL — ABNORMAL HIGH (ref 70–99)
Potassium: 3.1 mmol/L — ABNORMAL LOW (ref 3.5–5.1)
Sodium: 136 mmol/L (ref 135–145)
Total Bilirubin: 0.4 mg/dL (ref 0.3–1.2)
Total Protein: 6 g/dL — ABNORMAL LOW (ref 6.5–8.1)

## 2021-08-01 LAB — PHOSPHORUS: Phosphorus: 1.9 mg/dL — ABNORMAL LOW (ref 2.5–4.6)

## 2021-08-01 LAB — MAGNESIUM: Magnesium: 2 mg/dL (ref 1.7–2.4)

## 2021-08-01 MED ORDER — ACETAMINOPHEN 325 MG PO TABS
650.0000 mg | ORAL_TABLET | Freq: Four times a day (QID) | ORAL | Status: DC | PRN
  Administered 2021-08-01 (×2): 650 mg via ORAL
  Filled 2021-08-01 (×2): qty 2

## 2021-08-01 MED ORDER — POTASSIUM CHLORIDE CRYS ER 20 MEQ PO TBCR
40.0000 meq | EXTENDED_RELEASE_TABLET | Freq: Once | ORAL | Status: AC
  Administered 2021-08-01: 40 meq via ORAL
  Filled 2021-08-01: qty 2

## 2021-08-01 MED ORDER — INSULIN ASPART 100 UNIT/ML IJ SOLN
8.0000 [IU] | Freq: Three times a day (TID) | INTRAMUSCULAR | Status: DC
  Administered 2021-08-01 – 2021-08-02 (×4): 8 [IU] via SUBCUTANEOUS

## 2021-08-01 MED ORDER — INSULIN GLARGINE-YFGN 100 UNIT/ML ~~LOC~~ SOLN
46.0000 [IU] | Freq: Every day | SUBCUTANEOUS | Status: DC
Start: 2021-08-01 — End: 2021-08-02
  Administered 2021-08-01 – 2021-08-02 (×2): 46 [IU] via SUBCUTANEOUS
  Filled 2021-08-01 (×2): qty 0.46

## 2021-08-01 MED ORDER — POTASSIUM PHOSPHATES 15 MMOLE/5ML IV SOLN
30.0000 mmol | Freq: Once | INTRAVENOUS | Status: AC
  Administered 2021-08-01: 30 mmol via INTRAVENOUS
  Filled 2021-08-01: qty 10

## 2021-08-01 MED ORDER — DICLOFENAC SODIUM 1 % EX GEL
2.0000 g | Freq: Four times a day (QID) | CUTANEOUS | Status: DC | PRN
  Filled 2021-08-01: qty 100

## 2021-08-01 NOTE — Progress Notes (Signed)
PROGRESS NOTE        PATIENT DETAILS Name: Jennifer Boyer Age: 81 y.o. Sex: female Date of Birth: 06/03/1940 Admit Date: 07/29/2021 Admitting Physician Dwyane Dee, MD MGN:OIBB, Edwyna Shell, MD (Inactive)  Brief Summary: Patient is a 81 y.o.  female with history of DM-2, HTN, HLD, medication noncompliance-who presented with polydipsia/polyuria/confusion-found to have DKA and subsequently admitted to the hospitalist service.  Significant events: 7/11>> admit to [facility]-in DKA/encephalopathy.  Significant studies: 7/11>> TSH: Normal 7/11>> vitamin B12: 456 (normal) 7/11>>CXR: No PNA 7/11>> CT head: No acute intracranial abnormality. 7/12>> MRI brain: No acute intracranial abnormality.  Significant microbiology data: None  Procedures: None  Consults: Neurology  Subjective: Significantly more awake/alert-much more fluent and answers today.  She claims she feels much better as well.  Objective: Vitals: Blood pressure 131/71, pulse 76, temperature 98.4 F (36.9 C), temperature source Oral, resp. rate (!) 22, height '5\' 7"'$  (1.702 m), weight 104.3 kg, SpO2 96 %.   Exam: Gen Exam:Alert awake-not in any distress HEENT:atraumatic, normocephalic Chest: B/L clear to auscultation anteriorly CVS:S1S2 regular Abdomen:soft non tender, non distended Extremities:no edema Neurology: Non focal Skin: no rash   Pertinent Labs/Radiology:    Latest Ref Rng & Units 07/31/2021   12:22 AM 07/30/2021    1:57 AM 07/29/2021    3:50 PM  CBC  WBC 4.0 - 10.5 K/uL 5.1  7.6    Hemoglobin 12.0 - 15.0 g/dL 10.5  10.6  14.6   Hematocrit 36.0 - 46.0 % 32.0  32.0  43.0   Platelets 150 - 400 K/uL 135  178      Lab Results  Component Value Date   NA 136 08/01/2021   K 3.1 (L) 08/01/2021   CL 103 08/01/2021   CO2 28 08/01/2021    Assessment/Plan: DKA: Due to noncompliance-resolved.  AKI: Due to osmotic diuresis in the setting of DKA--saline lock all  IVF.  Hyperkalemia: Due to DKA/AKI-resolved.  Acute metabolic encephalopathy: Due to DKA-significant improvement-asuspect she is back to her baseline.  MRI brain negative-no further work-up recommended by neurology (note-CVA ruled out)  Hypokalemia/hypophosphatemia/hypomagnesemia: Continue to replete-recheck tomorrow.  DM-2 (A1c > 15.5 on 7/11): CBGs improving-increase Semglee to 46 units, increase Premeal NovoLog to 8 units-continue SSI.  Follow and adjust.    Recent Labs    07/31/21 1140 07/31/21 1551 07/31/21 2129  GLUCAP 315* 257* 243*     HTN: BP stable without the use of any antihypertensives-resume when able.  Hypothyroidism: Continue Synthroid.  TSH done on 7/11 stable.  HLD: on statin   Macrocytic anemia: Folate/B12 level stable-PCP to monitor further.  Deconditioning: Appreciate PT/OT-recommendations are for SNF.  Noncompliance: Spoke with son on 7/12-and subsequently with daughter on 7/13-patient has been very poorly compliant with medications.  They are also suggesting that patient may have developed some cognitive issues-as numerous bills have been unpaid.  Have recommended outpatient neurocognitive evaluation-epic referral for neurology placed.  Code status:   Code Status: Full Code   DVT Prophylaxis: enoxaparin (LOVENOX) injection 40 mg Start: 07/30/21 1700   Family Communication: Son-Robert-[phone]-(who connected with his sister)-both updated extensively on 7/13. Marland Kitchen   Disposition Plan: Status is: Inpatient Remains inpatient appropriate because: Optimize diabetic control -await insurance authorization for SNF.   Planned Discharge Destination: SNF.   Diet: Diet Order  Diet heart healthy/carb modified Room service appropriate? Yes; Fluid consistency: Thin  Diet effective now                     Antimicrobial agents: Anti-infectives (From admission, onward)    None        MEDICATIONS: Scheduled Meds:  enoxaparin  (LOVENOX) injection  40 mg Subcutaneous Q24H   insulin aspart  0-5 Units Subcutaneous QHS   insulin aspart  0-9 Units Subcutaneous TID WC   insulin aspart  8 Units Subcutaneous TID WC   insulin glargine-yfgn  46 Units Subcutaneous Daily   levothyroxine  75 mcg Oral Q0600   rosuvastatin  20 mg Oral Daily   Continuous Infusions:  potassium PHOSPHATE IVPB (in mmol) 30 mmol (08/01/21 0952)   PRN Meds:.acetaminophen, dextrose, diclofenac Sodium, hydrALAZINE, labetalol, ondansetron (ZOFRAN) IV   I have personally reviewed following labs and imaging studies  LABORATORY DATA: CBC: Recent Labs  Lab 07/29/21 1453 07/29/21 1540 07/29/21 1550 07/30/21 0157 07/31/21 0022  WBC 9.1  --   --  7.6 5.1  NEUTROABS 7.8*  --   --  5.2  --   HGB 13.3 15.3* 14.6 10.6* 10.5*  HCT 44.7 45.0 43.0 32.0* 32.0*  MCV 107.5*  --   --  97.3 95.2  PLT 231  --   --  178 135*     Basic Metabolic Panel: Recent Labs  Lab 07/29/21 2222 07/30/21 0157 07/30/21 1110 07/31/21 0022 08/01/21 0039  NA 136 136 137 136 136  K 3.4* 3.4* 3.0* 3.2* 3.1*  CL 106 106 107 100 103  CO2 10* 15* 23 21* 28  GLUCOSE 156* 135* 155* 429* 210*  BUN 36* 32* 27* 17 10  CREATININE 1.33* 1.17* 0.86 0.92 0.72  CALCIUM 8.5* 7.9* 7.9* 8.0* 8.4*  MG 1.7 1.7 1.8 2.2 2.0  PHOS 1.6* 1.3* 1.3* 2.1* 1.9*     GFR: Estimated Creatinine Clearance: 69.7 mL/min (by C-G formula based on SCr of 0.72 mg/dL).  Liver Function Tests: Recent Labs  Lab 08/01/21 0039  AST 16  ALT 12  ALKPHOS 56  BILITOT 0.4  PROT 6.0*  ALBUMIN 2.4*   No results for input(s): "LIPASE", "AMYLASE" in the last 168 hours. No results for input(s): "AMMONIA" in the last 168 hours.  Coagulation Profile: No results for input(s): "INR", "PROTIME" in the last 168 hours.  Cardiac Enzymes: No results for input(s): "CKTOTAL", "CKMB", "CKMBINDEX", "TROPONINI" in the last 168 hours.  BNP (last 3 results) No results for input(s): "PROBNP" in the last 8760  hours.  Lipid Profile: No results for input(s): "CHOL", "HDL", "LDLCALC", "TRIG", "CHOLHDL", "LDLDIRECT" in the last 72 hours.  Thyroid Function Tests: Recent Labs    07/29/21 2222  TSH 2.073     Anemia Panel: Recent Labs    07/29/21 2222  VITAMINB12 456  FOLATE 13.9     Urine analysis:    Component Value Date/Time   COLORURINE YELLOW 07/29/2021 1517   APPEARANCEUR HAZY (A) 07/29/2021 1517   LABSPEC 1.021 07/29/2021 1517   PHURINE 5.0 07/29/2021 1517   GLUCOSEU >=500 (A) 07/29/2021 1517   HGBUR NEGATIVE 07/29/2021 1517   BILIRUBINUR NEGATIVE 07/29/2021 1517   KETONESUR 80 (A) 07/29/2021 1517   PROTEINUR 30 (A) 07/29/2021 1517   NITRITE NEGATIVE 07/29/2021 1517   LEUKOCYTESUR NEGATIVE 07/29/2021 1517    Sepsis Labs: Lactic Acid, Venous No results found for: "LATICACIDVEN"  MICROBIOLOGY: No results found for this or any previous visit (from the  past 240 hour(s)).  RADIOLOGY STUDIES/RESULTS: MR BRAIN WO CONTRAST  Result Date: 07/31/2021 CLINICAL DATA:  81 year old female code stroke presentation 2 days ago. Altered mental status. EXAM: MRI HEAD WITHOUT CONTRAST TECHNIQUE: Multiplanar, multiecho pulse sequences of the brain and surrounding structures were obtained without intravenous contrast. COMPARISON:  Head CT 07/29/2021.  Brain MRI 03/02/2008. FINDINGS: Brain: Overall cerebral volume is normal for age. No restricted diffusion to suggest acute infarction. No midline shift, mass effect, evidence of mass lesion, ventriculomegaly, extra-axial collection or acute intracranial hemorrhage. Cervicomedullary junction and pituitary are within normal limits. Pearline Cables and white matter signal throughout the brain remains normal for age, not significantly changed from 2010. No definite cortical encephalomalacia or chronic cerebral blood products. Vascular: Major intracranial vascular flow voids are stable since 2010. Skull and upper cervical spine: Negative visible cervical spine.  Visualized bone marrow signal is within normal limits. Sinuses/Orbits: Negative orbits. Paranasal Visualized paranasal sinuses and mastoids are stable and well aerated. Other: Grossly normal visible internal auditory structures. Negative visible scalp and face. IMPRESSION: No acute intracranial abnormality and normal for age noncontrast MRI appearance of the brain. Electronically Signed   By: Genevie Ann M.D.   On: 07/31/2021 11:11     LOS: 3 days   Oren Binet, MD  [facility]    To contact the attending provider between 7A-7P or the covering provider during after hours 7P-7A, please log into the web site [email or web address] and access using universal  password for that web site. If you do not have the password, please call the hospital operator.  08/01/2021, 1:13 PM

## 2021-08-01 NOTE — Evaluation (Signed)
Occupational Therapy Evaluation Patient Details Name: Jennifer Boyer MRN: [id number] DOB: Jun 04, 1940 Today's Date: 08/01/2021   History of Present Illness 81 y.o.  female with history of DM-2, HTN, HLD, medication noncompliance-who presented 07/30/21 with polydipsia/polyuria/confusion-found to have DKA with encephalopathy. CT head and MRI brain negative   Clinical Impression   Pt was functioning independently in ADLs and ambulating with a cane as needed prior to admission. She lives with her cat. Pt was having difficulty managing IADLs, contributing to hospitalization. Pt presents with generalized weakness, decreased standing balance and impaired awareness of deficits. She requires up to min assist for mobility and ADLs. Recommending SNF for short term rehab.      Recommendations for follow up therapy are one component of a multi-disciplinary discharge planning process, led by the attending physician.  Recommendations may be updated based on patient status, additional functional criteria and insurance authorization.   Follow Up Recommendations  Skilled nursing-short term rehab (<3 hours/day)    Assistance Recommended at Discharge Frequent or constant Supervision/Assistance  Patient can return home with the following A little help with walking and/or transfers;A little help with bathing/dressing/bathroom;Direct supervision/assist for medications management;Direct supervision/assist for financial management;Assist for transportation;Help with stairs or ramp for entrance;Assistance with cooking/housework    Functional Status Assessment  Patient has had a recent decline in their functional status and demonstrates the ability to make significant improvements in function in a reasonable and predictable amount of time.  Equipment Recommendations  BSC/3in1    Recommendations for Other Services       Precautions / Restrictions Precautions Precautions: Fall Restrictions Weight Bearing  Restrictions: No      Mobility Bed Mobility Overal bed mobility: Needs Assistance Bed Mobility: Supine to Sit, Sit to Supine     Supine to sit: Min guard, HOB elevated Sit to supine: Min assist   General bed mobility comments: min assist for LEs back into bed    Transfers Overall transfer level: Needs assistance Equipment used: Rolling walker (2 wheels) Transfers: Sit to/from Stand Sit to Stand: Min assist, From elevated surface           General transfer comment: slow to rise      Balance Overall balance assessment: Needs assistance   Sitting balance-Leahy Scale: Good Sitting balance - Comments: no LOB donning socks   Standing balance support: Bilateral upper extremity supported Standing balance-Leahy Scale: Poor Standing balance comment: reliant on RW for side steps                           ADL either performed or assessed with clinical judgement   ADL Overall ADL's : Needs assistance/impaired Eating/Feeding: Independent;Bed level   Grooming: Set up;Sitting   Upper Body Bathing: Minimal assistance;Sitting   Lower Body Bathing: Minimal assistance;Sit to/from stand   Upper Body Dressing : Set up;Sitting   Lower Body Dressing: Minimal assistance;Sit to/from stand Lower Body Dressing Details (indicate cue type and reason): able to don socks using figure 4 method             Functional mobility during ADLs: Minimal assistance;Rolling walker (2 wheels) (side steps along EOB)       Vision Baseline Vision/History: 1 Wears glasses Ability to See in Adequate Light: 0 Adequate Patient Visual Report: No change from baseline       Perception     Praxis      Pertinent Vitals/Pain Pain Assessment Pain Assessment: Faces Faces Pain Scale: Hurts little more  Pain Location: L shoulder Pain Descriptors / Indicators: Aching Pain Intervention(s): Repositioned, Heat applied     Hand Dominance Right   Extremity/Trunk Assessment Upper  Extremity Assessment Upper Extremity Assessment: Generalized weakness   Lower Extremity Assessment Lower Extremity Assessment: Defer to PT evaluation       Communication Communication Communication: No difficulties   Cognition Arousal/Alertness: Awake/alert Behavior During Therapy: WFL for tasks assessed/performed Overall Cognitive Status: Impaired/Different from baseline Area of Impairment: Safety/judgement, Problem solving, Following commands, Memory                     Memory: Decreased short-term memory Following Commands: Follows one step commands with increased time Safety/Judgement: Decreased awareness of safety, Decreased awareness of deficits   Problem Solving: Slow processing, Decreased initiation, Requires verbal cues       General Comments       Exercises     Shoulder Instructions      Home Living Family/patient expects to be discharged to:: Private residence Living Arrangements: Alone Available Help at Discharge: Family;Available PRN/intermittently Type of Home: House Home Access: Level entry     Home Layout: One level               Home Equipment: Cane - single point          Prior Functioning/Environment Prior Level of Function : Independent/Modified Independent;Driving             Mobility Comments: uses a cane PRN          OT Problem List: Decreased strength;Impaired balance (sitting and/or standing);Decreased cognition;Decreased safety awareness;Decreased knowledge of use of DME or AE;Pain      OT Treatment/Interventions: Self-care/ADL training;DME and/or AE instruction;Therapeutic activities;Cognitive remediation/compensation;Patient/family education;Balance training    OT Goals(Current goals can be found in the care plan section) Acute Rehab OT Goals OT Goal Formulation: With patient/family Time For Goal Achievement: 08/15/21 Potential to Achieve Goals: Good ADL Goals Pt Will Perform Grooming: with  supervision;standing Pt Will Perform Lower Body Bathing: with supervision;sit to/from stand Pt Will Perform Lower Body Dressing: with supervision;sit to/from stand Pt Will Transfer to Toilet: with supervision;ambulating;bedside commode Pt Will Perform Toileting - Clothing Manipulation and hygiene: with supervision;sit to/from stand  OT Frequency: Min 2X/week    Co-evaluation              AM-PAC OT "6 Clicks" Daily Activity     Outcome Measure Help from another person eating meals?: None Help from another person taking care of personal grooming?: A Little Help from another person toileting, which includes using toliet, bedpan, or urinal?: A Little Help from another person bathing (including washing, rinsing, drying)?: A Little Help from another person to put on and taking off regular upper body clothing?: A Little Help from another person to put on and taking off regular lower body clothing?: A Little 6 Click Score: 19   End of Session Equipment Utilized During Treatment: Gait belt;Rolling walker (2 wheels)  Activity Tolerance: Patient tolerated treatment well Patient left: in bed;with call bell/phone within reach;with bed alarm set;with family/visitor present  OT Visit Diagnosis: Unsteadiness on feet (R26.81);Other abnormalities of gait and mobility (R26.89);Muscle weakness (generalized) (M62.81);Other symptoms and signs involving cognitive function;Pain Pain - Right/Left: Right Pain - part of body: Shoulder                Time: 1443-1520 OT Time Calculation (min): 37 min Charges:  OT General Charges $OT Visit: 1 Visit OT Evaluation $OT Eval Moderate Complexity: 1  Mod OT Treatments $Self Care/Home Management : 8-22 mins  Cleta Alberts, OTR/L Acute Rehabilitation Services Office: [phone]  Malka So 08/01/2021, 3:47 PM

## 2021-08-01 NOTE — TOC Progression Note (Signed)
Transition of Care [facility]) - Progression Note    Patient Details  Name: Jennifer Boyer MRN: [id number] Date of Birth: 1940-06-01  Transition of Care [facility]) CM/SW [place], [place] Work Phone Number: 08/01/2021, 2:02 PM  Clinical Narrative:    MSW intern called patient's daughter, Darrick Meigs to discuss bed offers. Due to several bed offers, MSW intern advised she would email the list over to [place] for her to review.    Expected Discharge Plan: Skilled Nursing Facility Barriers to Discharge: Ship broker, Continued Medical Work up, SNF Pending bed offer  Expected Discharge Plan and Services Expected Discharge Plan: [place] In-house Referral: Clinical Social Work   Post Acute Care Choice: [place] Living arrangements for the past 2 months: Single Family Home                                       Social Determinants of Health (SDOH) Interventions    Readmission Risk Interventions     No data to display

## 2021-08-01 NOTE — Care Management Important Message (Signed)
Important Message  Patient Details  Name: Jennifer Boyer MRN: [id number] Date of Birth: 1940-04-12   Medicare Important Message Given:  Yes     Odus Clasby Montine Circle 08/01/2021, 3:58 PM

## 2021-08-01 NOTE — Plan of Care (Signed)

## 2021-08-02 DIAGNOSIS — E1110 Type 2 diabetes mellitus with ketoacidosis without coma: Secondary | ICD-10-CM | POA: Diagnosis not present

## 2021-08-02 LAB — PHOSPHORUS: Phosphorus: 4.1 mg/dL (ref 2.5–4.6)

## 2021-08-02 LAB — BASIC METABOLIC PANEL
Anion gap: 4 — ABNORMAL LOW (ref 5–15)
Anion gap: 10 (ref 5–15)
BUN: 8 mg/dL (ref 8–23)
BUN: 9 mg/dL (ref 8–23)
CO2: 30 mmol/L (ref 22–32)
CO2: 27 mmol/L (ref 22–32)
Calcium: 9 mg/dL (ref 8.9–10.3)
Calcium: 8.9 mg/dL (ref 8.9–10.3)
Chloride: 103 mmol/L (ref 98–111)
Chloride: 100 mmol/L (ref 98–111)
Creatinine, Ser: 0.8 mg/dL (ref 0.44–1.00)
Creatinine, Ser: 0.69 mg/dL (ref 0.44–1.00)
GFR, Estimated: >60 mL/min (ref >60)
GFR, Estimated: >60 mL/min (ref >60)
Glucose, Bld: 204 mg/dL — ABNORMAL HIGH (ref 70–99)
Glucose, Bld: 230 mg/dL — ABNORMAL HIGH (ref 70–99)
Potassium: 5 mmol/L (ref 3.5–5.1)
Potassium: 4.5 mmol/L (ref 3.5–5.1)
Sodium: 137 mmol/L (ref 135–145)
Sodium: 137 mmol/L (ref 135–145)

## 2021-08-02 LAB — GLUCOSE, CAPILLARY
Glucose-Capillary: 243 mg/dL — ABNORMAL HIGH (ref 70–99)
Glucose-Capillary: 325 mg/dL — ABNORMAL HIGH (ref 70–99)
Glucose-Capillary: 259 mg/dL — ABNORMAL HIGH (ref 70–99)
Glucose-Capillary: 239 mg/dL — ABNORMAL HIGH (ref 70–99)
Glucose-Capillary: 257 mg/dL — ABNORMAL HIGH (ref 70–99)
Glucose-Capillary: 331 mg/dL — ABNORMAL HIGH (ref 70–99)
Glucose-Capillary: 286 mg/dL — ABNORMAL HIGH (ref 70–99)

## 2021-08-02 LAB — MAGNESIUM
Magnesium: 2 mg/dL (ref 1.7–2.4)
Magnesium: 2.1 mg/dL (ref 1.7–2.4)

## 2021-08-02 MED ORDER — INSULIN ASPART 100 UNIT/ML IJ SOLN
0.0000 [IU] | Freq: Every day | INTRAMUSCULAR | Status: DC
  Administered 2021-08-02: 3 [IU] via SUBCUTANEOUS
  Administered 2021-08-04: 2 [IU] via SUBCUTANEOUS

## 2021-08-02 MED ORDER — INSULIN ASPART 100 UNIT/ML IJ SOLN
0.0000 [IU] | Freq: Three times a day (TID) | INTRAMUSCULAR | Status: DC
  Administered 2021-08-02: 15 [IU] via SUBCUTANEOUS
  Administered 2021-08-03 (×2): 11 [IU] via SUBCUTANEOUS
  Administered 2021-08-03: 4 [IU] via SUBCUTANEOUS
  Administered 2021-08-04: 11 [IU] via SUBCUTANEOUS
  Administered 2021-08-04: 15 [IU] via SUBCUTANEOUS
  Administered 2021-08-04: 7 [IU] via SUBCUTANEOUS
  Administered 2021-08-05: 11 [IU] via SUBCUTANEOUS
  Administered 2021-08-05: 4 [IU] via SUBCUTANEOUS

## 2021-08-02 MED ORDER — INSULIN GLARGINE-YFGN 100 UNIT/ML ~~LOC~~ SOLN
55.0000 [IU] | Freq: Every day | SUBCUTANEOUS | Status: DC
  Filled 2021-08-02: qty 0.55

## 2021-08-02 MED ORDER — INSULIN GLARGINE-YFGN 100 UNIT/ML ~~LOC~~ SOLN
10.0000 [IU] | Freq: Once | SUBCUTANEOUS | Status: AC
Start: 2021-08-02 — End: 2021-08-02
  Administered 2021-08-02: 10 [IU] via SUBCUTANEOUS
  Filled 2021-08-02: qty 0.1

## 2021-08-02 MED ORDER — INSULIN ASPART 100 UNIT/ML IJ SOLN
3.0000 [IU] | Freq: Three times a day (TID) | INTRAMUSCULAR | Status: DC
  Administered 2021-08-02 – 2021-08-04 (×4): 3 [IU] via SUBCUTANEOUS

## 2021-08-02 MED ORDER — INSULIN ASPART 100 UNIT/ML IJ SOLN: 6.0000 [IU] | Freq: Three times a day (TID) | INTRAMUSCULAR | Status: DC

## 2021-08-02 NOTE — TOC Progression Note (Signed)
Transition of Care [facility]) - Progression Note    Patient Details  Name: Jennifer Boyer MRN: [id number] Date of Birth: 1940/05/07  Transition of Care [facility]) CM/SW Contact  Arnetia Bronk [place], [place] Phone Number: 08/02/2021, 3:05 PM  Clinical Narrative:    Phone call to patient's daughter to confirm SNF choice. Per daughter she would like to visit both [place] and [employer] and will make a definite decision then.  This social worker will plan to contact daughter tomorrow for bed choice.  Myrta Mercer, LCSW Transition of Care     Expected Discharge Plan: Skilled Nursing Facility Barriers to Discharge: Ship broker, Continued Medical Work up, SNF Pending bed offer  Expected Discharge Plan and Services Expected Discharge Plan: [place] In-house Referral: Clinical Social Work   Post Acute Care Choice: [place] Living arrangements for the past 2 months: Single Family Home                                       Social Determinants of Health (SDOH) Interventions    Readmission Risk Interventions     No data to display

## 2021-08-02 NOTE — Progress Notes (Signed)
PROGRESS NOTE        PATIENT DETAILS Name: Jennifer Boyer Age: 81 y.o. Sex: female Date of Birth: Nov 28, 1940 Admit Date: 07/29/2021 Admitting Physician Dwyane Dee, MD IOE:[id number], Edwyna Shell, MD (Inactive)  Brief Summary: Patient is a 81 y.o.  female with history of DM-2, HTN, HLD, medication noncompliance-who presented with polydipsia/polyuria/confusion-found to have DKA and subsequently admitted to the hospitalist service.  Significant events: 7/11>> admit to TRH-in DKA/encephalopathy.  Significant studies: 7/11>> TSH: Normal 7/11>> vitamin B12: 456 (normal) 7/11>>CXR: No PNA 7/11>> CT head: No acute intracranial abnormality. 7/12>> MRI brain: No acute intracranial abnormality.  Significant microbiology data: None  Procedures: None  Consults: Neurology  Subjective:  Patient in bed, appears comfortable, denies any headache, no fever, no chest pain or pressure, no shortness of breath , no abdominal pain. No new focal weakness.  Objective: Vitals: Blood pressure 132/71, pulse 74, temperature 97.9 F (36.6 C), temperature source Oral, resp. rate 20, height '5\' 7"'$  (1.702 m), weight 104.3 kg, SpO2 93 %.   Exam:  Awake Alert, No new F.N deficits, Normal affect [place].AT,PERRAL Supple Neck, No JVD,   Symmetrical Chest wall movement, Good air movement bilaterally, CTAB RRR,No Gallops, Rubs or new Murmurs,  +ve B.Sounds, Abd Soft, No tenderness,   No Cyanosis, Clubbing or edema      Assessment/Plan:  DKA in DM 2 : Due to noncompliance-resolved.  On Semglee and sliding scale along with Premeal NovoLog, dose adjusted for better control on 08/02/2021 patient counseled on compliance.  CBG (last 3)  Recent Labs    07/31/21 2129 08/01/21 2116 08/02/21 0900  GLUCAP 243* 259* 239*     AKI: Due to osmotic diuresis in the setting of DKA--saline lock all IVF.  Hyperkalemia: Due to DKA/AKI-resolved.  Acute metabolic encephalopathy: Due to  DKA-significant improvement-asuspect she is back to her baseline.  MRI brain negative-no further work-up recommended by neurology (note-CVA ruled out)  Hypokalemia/hypophosphatemia/hypomagnesemia: Continue to replete-recheck tomorrow.  HTN: BP stable without the use of any antihypertensives-resume when able.  Hypothyroidism: Continue Synthroid.  TSH done on 7/11 stable.  HLD: on statin   Macrocytic anemia: Folate/B12 level stable-PCP to monitor further.  Deconditioning: Appreciate PT/OT-recommendations are for SNF.  Noncompliance: Spoke with son on 7/12-and subsequently with daughter on 7/13-patient has been very poorly compliant with medications.  They are also suggesting that patient may have developed some cognitive issues-as numerous bills have been unpaid.  Have recommended outpatient neurocognitive evaluation-epic referral for neurology placed.  Code status:   Code Status: Full Code   DVT Prophylaxis: enoxaparin (LOVENOX) injection 40 mg Start: 07/30/21 1700   Family Communication: Son-Robert-[phone]-(who connected with his sister)-both updated extensively on 7/13. Marland Kitchen   Disposition Plan: Status is: Inpatient Remains inpatient appropriate because: Optimize diabetic control -await insurance authorization for SNF.   Planned Discharge Destination: SNF.   Diet: Diet Order             Diet heart healthy/carb modified Room service appropriate? Yes; Fluid consistency: Thin  Diet effective now                     Antimicrobial agents: Anti-infectives (From admission, onward)    None        MEDICATIONS: Scheduled Meds:  enoxaparin (LOVENOX) injection  40 mg Subcutaneous Q24H   insulin aspart  0-20 Units Subcutaneous TID WC  insulin aspart  0-5 Units Subcutaneous QHS   insulin aspart  3 Units Subcutaneous TID WC   insulin glargine-yfgn  10 Units Subcutaneous Once   [START ON 08/03/2021] insulin glargine-yfgn  55 Units Subcutaneous Daily   levothyroxine   75 mcg Oral Q0600   rosuvastatin  20 mg Oral Daily   Continuous Infusions:   PRN Meds:.acetaminophen, dextrose, diclofenac Sodium, hydrALAZINE, labetalol, ondansetron (ZOFRAN) IV   I have personally reviewed following labs and imaging studies  LABORATORY DATA:  Recent Labs  Lab 07/29/21 1453 07/29/21 1540 07/29/21 1550 07/30/21 0157 07/31/21 0022  WBC 9.1  --   --  7.6 5.1  HGB 13.3 15.3* 14.6 10.6* 10.5*  HCT 44.7 45.0 43.0 32.0* 32.0*  PLT 231  --   --  178 135*  MCV 107.5*  --   --  97.3 95.2  MCH 32.0  --   --  32.2 31.3  MCHC 29.8*  --   --  33.1 32.8  RDW 15.0  --   --  14.9 15.3  LYMPHSABS 0.7  --   --  1.7  --   MONOABS 0.5  --   --  0.6  --   EOSABS 0.0  --   --  0.0  --   BASOSABS 0.0  --   --  0.0  --     Recent Labs  Lab 07/29/21 2222 07/30/21 0157 07/30/21 1110 07/31/21 0022 08/01/21 0039 08/02/21 0146 08/02/21 0740  NA 136 136 137 136 136 137 137  K 3.4* 3.4* 3.0* 3.2* 3.1* 5.0 4.5  CL 106 106 107 100 103 103 100  CO2 10* 15* 23 21* '28 30 27  '$ GLUCOSE 156* 135* 155* 429* 210* 204* 230*  BUN 36* 32* 27* '17 10 8 9  '$ CREATININE 1.33* 1.17* 0.86 0.92 0.72 0.80 0.69  CALCIUM 8.5* 7.9* 7.9* 8.0* 8.4* 9.0 8.9  AST  --   --   --   --  16  --   --   ALT  --   --   --   --  12  --   --   ALKPHOS  --   --   --   --  56  --   --   BILITOT  --   --   --   --  0.4  --   --   ALBUMIN  --   --   --   --  2.4*  --   --   MG 1.7 1.7 1.8 2.2 2.0 2.0 2.1  PHOS 1.6* 1.3* 1.3* 2.1* 1.9* 4.1  --   TSH 2.073  --   --   --   --   --   --   HGBA1C >15.5*  --   --   --   --   --   --            LOS: 4 days   Signature  Lala Lund M.D on 08/02/2021 at 11:50 AM   -  To page go to [email or web address]

## 2021-08-02 NOTE — Plan of Care (Signed)

## 2021-08-03 DIAGNOSIS — E1110 Type 2 diabetes mellitus with ketoacidosis without coma: Secondary | ICD-10-CM | POA: Diagnosis not present

## 2021-08-03 LAB — MAGNESIUM: Magnesium: 1.7 mg/dL (ref 1.7–2.4)

## 2021-08-03 LAB — GLUCOSE, CAPILLARY
Glucose-Capillary: 160 mg/dL — ABNORMAL HIGH (ref 70–99)
Glucose-Capillary: 172 mg/dL — ABNORMAL HIGH (ref 70–99)
Glucose-Capillary: 263 mg/dL — ABNORMAL HIGH (ref 70–99)
Glucose-Capillary: 283 mg/dL — ABNORMAL HIGH (ref 70–99)
Glucose-Capillary: 169 mg/dL — ABNORMAL HIGH (ref 70–99)

## 2021-08-03 LAB — BASIC METABOLIC PANEL
Anion gap: 8 (ref 5–15)
BUN: 11 mg/dL (ref 8–23)
CO2: 27 mmol/L (ref 22–32)
Calcium: 8.7 mg/dL — ABNORMAL LOW (ref 8.9–10.3)
Chloride: 97 mmol/L — ABNORMAL LOW (ref 98–111)
Creatinine, Ser: 0.71 mg/dL (ref 0.44–1.00)
GFR, Estimated: >60 mL/min (ref >60)
Glucose, Bld: 210 mg/dL — ABNORMAL HIGH (ref 70–99)
Potassium: 4.1 mmol/L (ref 3.5–5.1)
Sodium: 132 mmol/L — ABNORMAL LOW (ref 135–145)

## 2021-08-03 MED ORDER — INSULIN GLARGINE-YFGN 100 UNIT/ML ~~LOC~~ SOLN
60.0000 [IU] | Freq: Every day | SUBCUTANEOUS | Status: DC
  Administered 2021-08-03: 60 [IU] via SUBCUTANEOUS
  Filled 2021-08-03 (×2): qty 0.6

## 2021-08-03 NOTE — Progress Notes (Signed)
PROGRESS NOTE        PATIENT DETAILS Name: Jennifer Boyer Age: 81 y.o. Sex: female Date of Birth: 11-Apr-1940 Admit Date: 07/29/2021 Admitting Physician Dwyane Dee, MD AOZ:[id number], Edwyna Shell, MD (Inactive)  Brief Summary: Patient is a 81 y.o.  female with history of DM-2, HTN, HLD, medication noncompliance-who presented with polydipsia/polyuria/confusion-found to have DKA and subsequently admitted to the hospitalist service.  Significant events: 7/11>> admit to TRH-in DKA/encephalopathy.  Significant studies: 7/11>> TSH: Normal 7/11>> vitamin B12: 456 (normal) 7/11>>CXR: No PNA 7/11>> CT head: No acute intracranial abnormality. 7/12>> MRI brain: No acute intracranial abnormality.  Significant microbiology data: None  Procedures: None  Consults: Neurology  Subjective:  Patient in bed, appears comfortable, denies any headache, no fever, no chest pain or pressure, no shortness of breath , no abdominal pain. No new focal weakness.   Objective: Vitals: Blood pressure 131/61, pulse 88, temperature 98.4 F (36.9 C), temperature source Oral, resp. rate 18, height '5\' 7"'$  (1.702 m), weight 104.3 kg, SpO2 93 %.   Exam:  Awake Alert, No new F.N deficits, Normal affect [place].AT,PERRAL Supple Neck, No JVD,   Symmetrical Chest wall movement, Good air movement bilaterally, CTAB RRR,No Gallops, Rubs or new Murmurs,  +ve B.Sounds, Abd Soft, No tenderness,   No Cyanosis, Clubbing or edema   Assessment/Plan:  DKA in DM 2 : Due to noncompliance-resolved.  On Semglee and sliding scale along with Premeal NovoLog, dose adjusted further on 08/03/2021 for better control, patient counseled on compliance.  CBG (last 3)  Recent Labs    08/02/21 2116 08/03/21 0643 08/03/21 0748  GLUCAP 286* 160* 172*    AKI: Due to osmotic diuresis in the setting of DKA--saline lock all IVF.  Hyperkalemia: Due to DKA/AKI-resolved.  Acute metabolic encephalopathy: Due to  DKA-significant improvement-asuspect she is back to her baseline.  MRI brain negative-no further work-up recommended by neurology (note-CVA ruled out)  Hypokalemia/hypophosphatemia/hypomagnesemia: Continue to replete-recheck tomorrow.  HTN: BP stable without the use of any antihypertensives-resume when able.  Hypothyroidism: Continue Synthroid.  TSH done on 7/11 stable.  HLD: on statin   Macrocytic anemia: Folate/B12 level stable-PCP to monitor further.  Deconditioning: Appreciate PT/OT-recommendations are for SNF.  Noncompliance: Spoke with son on 7/12-and subsequently with daughter on 7/13-patient has been very poorly compliant with medications.  They are also suggesting that patient may have developed some cognitive issues-as numerous bills have been unpaid.  Have recommended outpatient neurocognitive evaluation-epic referral for neurology placed.  Code status:   Code Status: Full Code   DVT Prophylaxis: enoxaparin (LOVENOX) injection 40 mg Start: 07/30/21 1700   Family Communication: Son-Robert-[phone]-(who connected with his sister)-both updated extensively on 7/13. Marland Kitchen   Disposition Plan: Status is: Inpatient Remains inpatient appropriate because: Optimize diabetic control -await insurance authorization for SNF.   Planned Discharge Destination: SNF.   Diet: Diet Order             Diet heart healthy/carb modified Room service appropriate? Yes; Fluid consistency: Thin  Diet effective now                     Antimicrobial agents: Anti-infectives (From admission, onward)    None        MEDICATIONS: Scheduled Meds:  enoxaparin (LOVENOX) injection  40 mg Subcutaneous Q24H   insulin aspart  0-20 Units Subcutaneous TID WC   insulin  aspart  0-5 Units Subcutaneous QHS   insulin aspart  3 Units Subcutaneous TID WC   insulin glargine-yfgn  60 Units Subcutaneous Daily   levothyroxine  75 mcg Oral Q0600   rosuvastatin  20 mg Oral Daily   Continuous  Infusions:   PRN Meds:.acetaminophen, dextrose, diclofenac Sodium, hydrALAZINE, labetalol, ondansetron (ZOFRAN) IV   I have personally reviewed following labs and imaging studies  LABORATORY DATA:  Recent Labs  Lab 07/29/21 1453 07/29/21 1540 07/29/21 1550 07/30/21 0157 07/31/21 0022  WBC 9.1  --   --  7.6 5.1  HGB 13.3 15.3* 14.6 10.6* 10.5*  HCT 44.7 45.0 43.0 32.0* 32.0*  PLT 231  --   --  178 135*  MCV 107.5*  --   --  97.3 95.2  MCH 32.0  --   --  32.2 31.3  MCHC 29.8*  --   --  33.1 32.8  RDW 15.0  --   --  14.9 15.3  LYMPHSABS 0.7  --   --  1.7  --   MONOABS 0.5  --   --  0.6  --   EOSABS 0.0  --   --  0.0  --   BASOSABS 0.0  --   --  0.0  --     Recent Labs  Lab 07/29/21 2222 07/30/21 0157 07/30/21 1110 07/31/21 0022 08/01/21 0039 08/02/21 0146 08/02/21 0740 08/03/21 0035  NA 136 136 137 136 136 137 137 132*  K 3.4* 3.4* 3.0* 3.2* 3.1* 5.0 4.5 4.1  CL 106 106 107 100 103 103 100 97*  CO2 10* 15* 23 21* '28 30 27 27  '$ GLUCOSE 156* 135* 155* 429* 210* 204* 230* 210*  BUN 36* 32* 27* '17 10 8 9 11  '$ CREATININE 1.33* 1.17* 0.86 0.92 0.72 0.80 0.69 0.71  CALCIUM 8.5* 7.9* 7.9* 8.0* 8.4* 9.0 8.9 8.7*  AST  --   --   --   --  16  --   --   --   ALT  --   --   --   --  12  --   --   --   ALKPHOS  --   --   --   --  56  --   --   --   BILITOT  --   --   --   --  0.4  --   --   --   ALBUMIN  --   --   --   --  2.4*  --   --   --   MG 1.7 1.7 1.8 2.2 2.0 2.0 2.1 1.7  PHOS 1.6* 1.3* 1.3* 2.1* 1.9* 4.1  --   --   TSH 2.073  --   --   --   --   --   --   --   HGBA1C >15.5*  --   --   --   --   --   --   --      LOS: 5 days   Signature  Lala Lund M.D on 08/03/2021 at 11:30 AM   -  To page go to [email or web address]

## 2021-08-04 DIAGNOSIS — F09 Unspecified mental disorder due to known physiological condition: Secondary | ICD-10-CM | POA: Diagnosis not present

## 2021-08-04 DIAGNOSIS — E1110 Type 2 diabetes mellitus with ketoacidosis without coma: Secondary | ICD-10-CM | POA: Diagnosis not present

## 2021-08-04 LAB — POCT I-STAT EG7
Acid-base deficit: 24 mmol/L — ABNORMAL HIGH (ref 0.0–2.0)
Bicarbonate: 4.5 mmol/L — ABNORMAL LOW (ref 20.0–28.0)
Calcium, Ion: 1.02 mmol/L — ABNORMAL LOW (ref 1.15–1.40)
HCT: 45 % (ref 36.0–46.0)
Hemoglobin: 15.3 g/dL — ABNORMAL HIGH (ref 12.0–15.0)
O2 Saturation: 84 %
Potassium: >8.5 mmol/L (ref 3.5–5.1)
Sodium: 133 mmol/L — ABNORMAL LOW (ref 135–145)
TCO2: <5 mmol/L — ABNORMAL LOW (ref 22–32)
pCO2, Ven: 15.9 mmHg — CL (ref 44–60)
pH, Ven: 7.059 — CL (ref 7.25–7.43)
pO2, Ven: 67 mmHg — ABNORMAL HIGH (ref 32–45)

## 2021-08-04 LAB — GLUCOSE, CAPILLARY
Glucose-Capillary: 304 mg/dL — ABNORMAL HIGH (ref 70–99)
Glucose-Capillary: 263 mg/dL — ABNORMAL HIGH (ref 70–99)
Glucose-Capillary: 222 mg/dL — ABNORMAL HIGH (ref 70–99)
Glucose-Capillary: 224 mg/dL — ABNORMAL HIGH (ref 70–99)

## 2021-08-04 MED ORDER — INSULIN GLARGINE-YFGN 100 UNIT/ML ~~LOC~~ SOLN
65.0000 [IU] | Freq: Every day | SUBCUTANEOUS | Status: DC
  Administered 2021-08-04 – 2021-08-05 (×2): 65 [IU] via SUBCUTANEOUS
  Filled 2021-08-04 (×2): qty 0.65

## 2021-08-04 MED ORDER — INSULIN ASPART 100 UNIT/ML IJ SOLN
5.0000 [IU] | Freq: Three times a day (TID) | INTRAMUSCULAR | Status: DC
  Administered 2021-08-04 – 2021-08-05 (×4): 5 [IU] via SUBCUTANEOUS

## 2021-08-04 NOTE — Progress Notes (Addendum)
Physical Therapy Treatment Patient Details Name: Jennifer Boyer MRN: [id number] DOB: [birth year] Today's Date: 08/04/2021   History of Present Illness 81 y.o.  female with history of DM-2, HTN, HLD, medication noncompliance-who presented 07/30/21 with polydipsia/polyuria/confusion-found to have DKA with encephalopathy. CT head and MRI brain negative    PT Comments    Patient up in chair on arrival--reporting she had been in chair all night and wanting to return to bed. NT could not confirm if this was accurate. Able to progress to ambulation with min assist (+2 for equipment) x 45 ft with RW. Patient requiring assist to maneuver around obstacles. Requires step by step cues for return to bed. Patient immediately incontinent of urine on return to supine. Assisted with cleaning and linen change with pt rolling bil with rail.     Recommendations for follow up therapy are one component of a multi-disciplinary discharge planning process, led by the attending physician.  Recommendations may be updated based on patient status, additional functional criteria and insurance authorization.  Follow Up Recommendations  Skilled nursing-short term rehab (<3 hours/day) Can patient physically be transported by private vehicle: No   Assistance Recommended at Discharge Frequent or constant Supervision/Assistance  Patient can return home with the following Assistance with cooking/housework;Direct supervision/assist for medications management;Direct supervision/assist for financial management;Assist for transportation;Help with stairs or ramp for entrance;A little help with walking and/or transfers   Equipment Recommendations  Rolling walker (2 wheels)    Recommendations for Other Services       Precautions / Restrictions Precautions Precautions: Fall Restrictions Weight Bearing Restrictions: No     Mobility  Bed Mobility Overal bed mobility: Needs Assistance Bed Mobility: Sit to Supine,  Rolling Rolling: Supervision       Sit to sidelying: Min assist General bed mobility comments: min assist for LEs back into bed; using rail for rolling    Transfers Overall transfer level: Needs assistance Equipment used: Rolling walker (2 wheels) Transfers: Sit to/from Stand Sit to Stand: Min assist           General transfer comment: slow to rise from recliner    Ambulation/Gait Ambulation/Gait assistance: Min assist, +2 safety/equipment Gait Distance (Feet): 45 Feet Assistive device: Rolling walker (2 wheels) Gait Pattern/deviations: Step-through pattern, Decreased stride length, Trunk flexed Gait velocity: decr     General Gait Details: assist wtih maneuvering RW during turns and around obstacles   Stairs             Wheelchair Mobility    Modified Rankin (Stroke Patients Only)       Balance Overall balance assessment: Needs assistance Sitting-balance support: No upper extremity supported, Feet supported Sitting balance-Leahy Scale: Good     Standing balance support: Bilateral upper extremity supported Standing balance-Leahy Scale: Poor Standing balance comment: reliant on RW                            Cognition Arousal/Alertness: Awake/alert Behavior During Therapy: WFL for tasks assessed/performed Overall Cognitive Status: Impaired/Different from baseline Area of Impairment: Safety/judgement, Problem solving, Following commands, Memory                     Memory: Decreased short-term memory Following Commands: Follows one step commands with increased time Safety/Judgement: Decreased awareness of safety, Decreased awareness of deficits Awareness: Intellectual Problem Solving: Slow processing, Decreased initiation, Requires verbal cues General Comments: required step by step cues for return to bed and rolling  Exercises General Exercises - Lower Extremity Ankle Circles/Pumps: AROM, Both, 10 reps Heel Slides:  AROM, Both, 10 reps    General Comments General comments (skin integrity, edema, etc.): on return to supine pt with incontinence of urine; rolling for cleaning and changing linen; purewick placed      Pertinent Vitals/Pain Pain Assessment Pain Assessment: No/denies pain    Home Living Family/patient expects to be discharged to:: Private residence Living Arrangements: Alone Available Help at Discharge: Family;Available PRN/intermittently Type of Home: House Home Access: Level entry       Home Layout: One level Home Equipment: Cane - single point      Prior Function            PT Goals (current goals can now be found in the care plan section) Acute Rehab PT Goals Patient Stated Goal: to return home Time For Goal Achievement: 08/14/21 Potential to Achieve Goals: Good Progress towards PT goals: Progressing toward goals    Frequency    Min 2X/week      PT Plan Frequency needs to be updated    Co-evaluation              AM-PAC PT "6 Clicks" Mobility   Outcome Measure  Help needed turning from your back to your side while in a flat bed without using bedrails?: A Little Help needed moving from lying on your back to sitting on the side of a flat bed without using bedrails?: A Lot Help needed moving to and from a bed to a chair (including a wheelchair)?: A Little Help needed standing up from a chair using your arms (e.g., wheelchair or bedside chair)?: A Little Help needed to walk in hospital room?: Total Help needed climbing 3-5 steps with a railing? : Total 6 Click Score: 13    End of Session Equipment Utilized During Treatment: Gait belt Activity Tolerance: Patient tolerated treatment well Patient left: in bed;with call bell/phone within reach;with bed alarm set Nurse Communication: Mobility status PT Visit Diagnosis: Unsteadiness on feet (R26.81);Difficulty in walking, not elsewhere classified (R26.2)     Time: 9233-0076 PT Time Calculation (min)  (ACUTE ONLY): 21 min  Charges:  $Gait Training: 8-22 mins                      Arby Barrette, PT [place]  Office [phone]    Rexanne Mano 08/04/2021, 12:48 PM

## 2021-08-04 NOTE — TOC Progression Note (Addendum)
Transition of Care [facility]) - Progression Note    Patient Details  Name: AHAVA KISSOON MRN: [id number] Date of Birth: March 09, 1940  Transition of Care [facility]) CM/SW [place], LCSW Phone Number: 08/04/2021, 9:57 AM  Clinical Narrative:    9:57am-CSW left voicemail for patient's daughter requesting SNF choice. PT to see patient today for insurance authorization process.   12:31pm-Patient's daughter returned call and has selected [place]. Facility will start insurance process.    Expected Discharge Plan: Skilled Nursing Facility Barriers to Discharge: Ship broker, Continued Medical Work up, SNF Pending bed offer  Expected Discharge Plan and Services Expected Discharge Plan: [place] In-house Referral: Clinical Social Work   Post Acute Care Choice: [place] Living arrangements for the past 2 months: Single Family Home                                       Social Determinants of Health (SDOH) Interventions    Readmission Risk Interventions     No data to display

## 2021-08-04 NOTE — Progress Notes (Signed)
PROGRESS NOTE        PATIENT DETAILS Name: Jennifer Boyer Age: 81 y.o. Sex: female Date of Birth: Aug 06, 1940 Admit Date: 07/29/2021 Admitting Physician Dwyane Dee, MD FMB:WGYK, Edwyna Shell, MD (Inactive)  Brief Summary: Patient is a 81 y.o.  female with history of DM-2, HTN, HLD, medication noncompliance-who presented with polydipsia/polyuria/confusion-found to have DKA and subsequently admitted to the hospitalist service.  Significant events: 7/11>> admit to [facility]-in DKA/encephalopathy.  Significant studies: 7/11>> TSH: Normal 7/11>> vitamin B12: 456 (normal) 7/11>>CXR: No PNA 7/11>> CT head: No acute intracranial abnormality. 7/12>> MRI brain: No acute intracranial abnormality.  Significant microbiology data: None  Procedures: None  Consults: Neurology  Subjective:  Patient in bed, appears comfortable, denies any headache, no fever, no chest pain or pressure, no shortness of breath , no abdominal pain. No new focal weakness.    Objective: Vitals: Blood pressure (!) 133/51, pulse 81, temperature 98.2 F (36.8 C), temperature source Oral, resp. rate 18, height '5\' 7"'$  (1.702 m), weight 104.3 kg, SpO2 92 %.   Exam:  Awake Alert, No new F.N deficits, Normal affect [place].AT,PERRAL Supple Neck, No JVD,   Symmetrical Chest wall movement, Good air movement bilaterally, CTAB RRR,No Gallops, Rubs or new Murmurs,  +ve B.Sounds, Abd Soft, No tenderness,   No Cyanosis, Clubbing or edema    Assessment/Plan:  DKA in DM 2 : Due to noncompliance-resolved.  On Semglee and sliding scale along with Premeal NovoLog, dose adjusted further on 08/04/2021 for better control, patient counseled on compliance.  CBG (last 3)  Recent Labs    08/03/21 1746 08/03/21 2155 08/04/21 0841  GLUCAP 283* 169* 304*    AKI: Due to osmotic diuresis in the setting of DKA--saline lock all IVF.  Hyperkalemia: Due to DKA/AKI-resolved.  Acute metabolic encephalopathy: Due  to DKA-significant improvement-asuspect she is back to her baseline.  MRI brain negative-no further work-up recommended by neurology (note-CVA ruled out)  Hypokalemia/hypophosphatemia/hypomagnesemia: Continue to replete-recheck tomorrow.  HTN: BP stable without the use of any antihypertensives-resume when able.  Hypothyroidism: Continue Synthroid.  TSH done on 7/11 stable.  HLD: on statin   Macrocytic anemia: Folate/B12 level stable-PCP to monitor further.  Deconditioning: Appreciate PT/OT-recommendations are for SNF.  Noncompliance: Spoke with son on 7/12-and subsequently with daughter on 7/13-patient has been very poorly compliant with medications.  They are also suggesting that patient may have developed some cognitive issues-as numerous bills have been unpaid.  Have recommended outpatient neurocognitive evaluation-epic referral for neurology placed.  Code status:   Code Status: Full Code   DVT Prophylaxis: enoxaparin (LOVENOX) injection 40 mg Start: 07/30/21 1700   Family Communication: Son-[phone]-(who connected with his sister)-both updated extensively on 7/13. Marland Kitchen   Disposition Plan: Status is: Inpatient Remains inpatient appropriate because: Optimize diabetic control -await insurance authorization for SNF.   Planned Discharge Destination: SNF.   Diet: Diet Order             Diet heart healthy/carb modified Room service appropriate? Yes; Fluid consistency: Thin  Diet effective now                     Antimicrobial agents: Anti-infectives (From admission, onward)    None        MEDICATIONS: Scheduled Meds:  enoxaparin (LOVENOX) injection  40 mg Subcutaneous Q24H   insulin aspart  0-20 Units Subcutaneous TID WC  insulin aspart  0-5 Units Subcutaneous QHS   insulin aspart  3 Units Subcutaneous TID WC   insulin glargine-yfgn  65 Units Subcutaneous Daily   levothyroxine  75 mcg Oral Q0600   rosuvastatin  20 mg Oral Daily   Continuous  Infusions:   PRN Meds:.acetaminophen, dextrose, diclofenac Sodium, hydrALAZINE, labetalol, ondansetron (ZOFRAN) IV   I have personally reviewed following labs and imaging studies  LABORATORY DATA:  Recent Labs  Lab 07/29/21 1453 07/29/21 1540 07/29/21 1550 07/30/21 0157 07/31/21 0022  WBC 9.1  --   --  7.6 5.1  HGB 13.3 15.3* 14.6 10.6* 10.5*  HCT 44.7 45.0 43.0 32.0* 32.0*  PLT 231  --   --  178 135*  MCV 107.5*  --   --  97.3 95.2  MCH 32.0  --   --  32.2 31.3  MCHC 29.8*  --   --  33.1 32.8  RDW 15.0  --   --  14.9 15.3  LYMPHSABS 0.7  --   --  1.7  --   MONOABS 0.5  --   --  0.6  --   EOSABS 0.0  --   --  0.0  --   BASOSABS 0.0  --   --  0.0  --     Recent Labs  Lab 07/29/21 2222 07/30/21 0157 07/30/21 1110 07/31/21 0022 08/01/21 0039 08/02/21 0146 08/02/21 0740 08/03/21 0035  NA 136 136 137 136 136 137 137 132*  K 3.4* 3.4* 3.0* 3.2* 3.1* 5.0 4.5 4.1  CL 106 106 107 100 103 103 100 97*  CO2 10* 15* 23 21* '28 30 27 27  '$ GLUCOSE 156* 135* 155* 429* 210* 204* 230* 210*  BUN 36* 32* 27* '17 10 8 9 11  '$ CREATININE 1.33* 1.17* 0.86 0.92 0.72 0.80 0.69 0.71  CALCIUM 8.5* 7.9* 7.9* 8.0* 8.4* 9.0 8.9 8.7*  AST  --   --   --   --  16  --   --   --   ALT  --   --   --   --  12  --   --   --   ALKPHOS  --   --   --   --  56  --   --   --   BILITOT  --   --   --   --  0.4  --   --   --   ALBUMIN  --   --   --   --  2.4*  --   --   --   MG 1.7 1.7 1.8 2.2 2.0 2.0 2.1 1.7  PHOS 1.6* 1.3* 1.3* 2.1* 1.9* 4.1  --   --   TSH 2.073  --   --   --   --   --   --   --   HGBA1C >15.5*  --   --   --   --   --   --   --      LOS: 6 days   Signature  Lala Lund M.D on 08/04/2021 at 9:38 AM   -  To page go to [email or web address]

## 2021-08-05 DIAGNOSIS — Z794 Long term (current) use of insulin: Secondary | ICD-10-CM | POA: Diagnosis not present

## 2021-08-05 DIAGNOSIS — N179 Acute kidney failure, unspecified: Secondary | ICD-10-CM | POA: Diagnosis not present

## 2021-08-05 DIAGNOSIS — E875 Hyperkalemia: Secondary | ICD-10-CM | POA: Diagnosis not present

## 2021-08-05 DIAGNOSIS — E1159 Type 2 diabetes mellitus with other circulatory complications: Secondary | ICD-10-CM | POA: Diagnosis not present

## 2021-08-05 DIAGNOSIS — D649 Anemia, unspecified: Secondary | ICD-10-CM | POA: Diagnosis not present

## 2021-08-05 DIAGNOSIS — G9341 Metabolic encephalopathy: Secondary | ICD-10-CM | POA: Diagnosis not present

## 2021-08-05 DIAGNOSIS — E119 Type 2 diabetes mellitus without complications: Secondary | ICD-10-CM | POA: Diagnosis not present

## 2021-08-05 DIAGNOSIS — Z1389 Encounter for screening for other disorder: Secondary | ICD-10-CM | POA: Diagnosis not present

## 2021-08-05 DIAGNOSIS — E0829 Diabetes mellitus due to underlying condition with other diabetic kidney complication: Secondary | ICD-10-CM | POA: Diagnosis not present

## 2021-08-05 DIAGNOSIS — I10 Essential (primary) hypertension: Secondary | ICD-10-CM | POA: Diagnosis not present

## 2021-08-05 DIAGNOSIS — Z7401 Bed confinement status: Secondary | ICD-10-CM | POA: Diagnosis not present

## 2021-08-05 DIAGNOSIS — I152 Hypertension secondary to endocrine disorders: Secondary | ICD-10-CM | POA: Diagnosis not present

## 2021-08-05 DIAGNOSIS — E039 Hypothyroidism, unspecified: Secondary | ICD-10-CM | POA: Diagnosis not present

## 2021-08-05 DIAGNOSIS — I872 Venous insufficiency (chronic) (peripheral): Secondary | ICD-10-CM | POA: Diagnosis not present

## 2021-08-05 DIAGNOSIS — Z0189 Encounter for other specified special examinations: Secondary | ICD-10-CM | POA: Diagnosis not present

## 2021-08-05 DIAGNOSIS — M2550 Pain in unspecified joint: Secondary | ICD-10-CM | POA: Diagnosis not present

## 2021-08-05 DIAGNOSIS — Z9189 Other specified personal risk factors, not elsewhere classified: Secondary | ICD-10-CM | POA: Diagnosis not present

## 2021-08-05 DIAGNOSIS — D539 Nutritional anemia, unspecified: Secondary | ICD-10-CM | POA: Diagnosis not present

## 2021-08-05 DIAGNOSIS — D511 Vitamin B12 deficiency anemia due to selective vitamin B12 malabsorption with proteinuria: Secondary | ICD-10-CM | POA: Diagnosis not present

## 2021-08-05 DIAGNOSIS — M5126 Other intervertebral disc displacement, lumbar region: Secondary | ICD-10-CM | POA: Diagnosis not present

## 2021-08-05 DIAGNOSIS — E1110 Type 2 diabetes mellitus with ketoacidosis without coma: Secondary | ICD-10-CM | POA: Diagnosis not present

## 2021-08-05 DIAGNOSIS — E785 Hyperlipidemia, unspecified: Secondary | ICD-10-CM | POA: Diagnosis not present

## 2021-08-05 DIAGNOSIS — R0989 Other specified symptoms and signs involving the circulatory and respiratory systems: Secondary | ICD-10-CM | POA: Diagnosis not present

## 2021-08-05 DIAGNOSIS — R4189 Other symptoms and signs involving cognitive functions and awareness: Secondary | ICD-10-CM | POA: Diagnosis not present

## 2021-08-05 LAB — BASIC METABOLIC PANEL
Anion gap: 8 (ref 5–15)
BUN: 11 mg/dL (ref 8–23)
CO2: 27 mmol/L (ref 22–32)
Calcium: 9.1 mg/dL (ref 8.9–10.3)
Chloride: 102 mmol/L (ref 98–111)
Creatinine, Ser: 0.72 mg/dL (ref 0.44–1.00)
GFR, Estimated: >60 mL/min (ref >60)
Glucose, Bld: 149 mg/dL — ABNORMAL HIGH (ref 70–99)
Potassium: 4.6 mmol/L (ref 3.5–5.1)
Sodium: 137 mmol/L (ref 135–145)

## 2021-08-05 LAB — CBC
HCT: 35.7 % — ABNORMAL LOW (ref 36.0–46.0)
Hemoglobin: 11.5 g/dL — ABNORMAL LOW (ref 12.0–15.0)
MCH: 31.6 pg (ref 26.0–34.0)
MCHC: 32.2 g/dL (ref 30.0–36.0)
MCV: 98.1 fL (ref 80.0–100.0)
Platelets: 209 10*3/uL (ref 150–400)
RBC: 3.64 MIL/uL — ABNORMAL LOW (ref 3.87–5.11)
RDW: 14.9 % (ref 11.5–15.5)
WBC: 5.8 10*3/uL (ref 4.0–10.5)
nRBC: 0 % (ref 0.0–0.2)

## 2021-08-05 LAB — MAGNESIUM: Magnesium: 1.9 mg/dL (ref 1.7–2.4)

## 2021-08-05 LAB — BRAIN NATRIURETIC PEPTIDE: B Natriuretic Peptide: 32.8 pg/mL (ref 0.0–100.0)

## 2021-08-05 LAB — GLUCOSE, CAPILLARY
Glucose-Capillary: 158 mg/dL — ABNORMAL HIGH (ref 70–99)
Glucose-Capillary: 258 mg/dL — ABNORMAL HIGH (ref 70–99)

## 2021-08-05 MED ORDER — OZEMPIC (0.25 OR 0.5 MG/DOSE) 2 MG/3ML ~~LOC~~ SOPN: 0.2500 mg | PEN_INJECTOR | SUBCUTANEOUS | Status: DC

## 2021-08-05 MED ORDER — BASAGLAR KWIKPEN 100 UNIT/ML ~~LOC~~ SOPN: 55.0000 [IU] | PEN_INJECTOR | Freq: Every day | SUBCUTANEOUS | Status: AC

## 2021-08-05 MED ORDER — INSULIN ASPART 100 UNIT/ML FLEXPEN: PEN_INJECTOR | SUBCUTANEOUS | 0 refills | Status: DC

## 2021-08-05 NOTE — Progress Notes (Signed)
Daughter visiting at bedside.  She is requesting a call from the attending MD in the morning for an update.  Renato Battles (Daughter) [phone]

## 2021-08-05 NOTE — Discharge Summary (Addendum)
Jennifer Boyer SEG:[id number] DOB: [birth year] DOA: 07/29/2021  PCP: Vernie Shanks, MD (Inactive)  Admit date: 07/29/2021  Discharge date: 08/05/2021  Admitted From: Home   Disposition:  SNF   Recommendations for Outpatient Follow-up:   Follow up with PCP in 1-2 weeks  PCP Please obtain BMP/CBC, 2 view CXR in 1week,  (see Discharge instructions)   PCP Please follow up on the following pending results:    Home Health: None   Equipment/Devices: None  Consultations: Neurology Discharge Condition: Stable    CODE STATUS: Full    Diet Recommendation: Heart Healthy Low Carb, check CBGs q. ACH S.  CC - AMS    Brief history of present illness from the day of admission and additional interim summary    81 y.o.  female with history of DM-2, HTN, HLD, medication noncompliance-who presented with polydipsia/polyuria/confusion-found to have DKA and subsequently admitted to the hospitalist service.   Significant events: 7/11>> admit to [facility]-in DKA/encephalopathy.   Significant studies: 7/11>> TSH: Normal 7/11>> vitamin B12: 456 (normal) 7/11>>CXR: No PNA 7/11>> CT head: No acute intracranial abnormality. 7/12>> MRI brain: No acute intracranial abnormality.   Significant microbiology data: None   Procedures: None   Consults: Neurology                                                                 Hospital Course    DKA in DM 2 : Due to noncompliance-resolved.  On Semglee and sliding scale along with Premeal NovoLog, dose adjusted further on 08/05/2021 for better control, patient counseled on compliance.  Please check CBGs q. ACH S at SNF and adjust as needed.  Note her noncompliance could be due to memory issues and from missing her insulin dosages due to that.  According to the daughter patient has been  appearing forgetful for several months prior to admission.  Also some element of depression due to her husband being sick lately and being admitted to her memory unit.   CBG (last 3)  Recent Labs    08/04/21 1607 08/04/21 2111 08/05/21 0828  GLUCAP 222* 224* 158*   Lab Results  Component Value Date   HGBA1C >15.5 (H) 07/29/2021    AKI: Due to osmotic diuresis in the setting of DKA--saline lock all IVF.  Resolved.  Hyperkalemia: Due to DKA/AKI-resolved.   Acute metabolic encephalopathy: Due to DKA-significant improvement-no focal deficits, seen by neurology, MRI brain negative- now back to baseline, likely has age related cognitive decline vs Dementia, also might have some element of depression due to recent illness of her husband. Daughter updated 08/05/21.  She will definitely benefit from outpatient Neurology and Psychologist eval in 7-10 days post DC.    HTN: BP stable without the use of any antihypertensives-resume when able.   Hypothyroidism: Continue Synthroid.  TSH  done on 7/11 stable.   HLD: on statin   Macrocytic anemia: Folate/B12 level stable-PCP to monitor further.   Deconditioning: Appreciate PT/OT-recommendations are for SNF.   Discharge diagnosis     Principal Problem:   DKA, type 2 ([place]) Active Problems:   Hyperkalemia   AKI (acute kidney injury) ([place])   Hypothyroidism   HTN (hypertension)   Macrocytic anemia   HLD (hyperlipidemia)    Discharge instructions    Discharge Instructions     Ambulatory referral to Neurology   Complete by: As directed    An appointment is requested in approximately: 4 weeks   Diet - low sodium heart healthy   Complete by: As directed    Discharge instructions   Complete by: As directed    Follow with Primary MD Vernie Shanks, MD (Inactive) in 7 days   Get CBC, CMP, Magnesium, 2 view Chest X ray -  checked next visit within 1 week by   SNF MD    Activity: As tolerated with Full fall precautions use  walker/cane & assistance as needed  Disposition SNF  Diet: Heart Healthy low carbohydrate diet.  Check CBGs q. [place].  Special Instructions: If you have smoked or chewed Tobacco  in the last 2 yrs please stop smoking, stop any regular Alcohol  and or any Recreational drug use.  On your next visit with your primary care physician please Get Medicines reviewed and adjusted.  Please request your Prim.MD to go over all Hospital Tests and Procedure/Radiological results at the follow up, please get all Hospital records sent to your Prim MD by signing hospital release before you go home.  If you experience worsening of your admission symptoms, develop shortness of breath, life threatening emergency, suicidal or homicidal thoughts you must seek medical attention immediately by calling 911 or calling your MD immediately  if symptoms less severe.  You Must read complete instructions/literature along with all the possible adverse reactions/side effects for all the Medicines you take and that have been prescribed to you. Take any new Medicines after you have completely understood and accpet all the possible adverse reactions/side effects.   Increase activity slowly   Complete by: As directed    No wound care   Complete by: As directed        Discharge Medications   Allergies as of 08/05/2021   No Known Allergies      Medication List     STOP taking these medications    amLODipine 5 MG tablet Commonly known as: NORVASC   cyclobenzaprine 10 MG tablet Commonly known as: FLEXERIL   cyclobenzaprine 5 MG tablet Commonly known as: FLEXERIL   meloxicam 15 MG tablet Commonly known as: MOBIC   Oxycodone HCl 10 MG Tabs   sitaGLIPtin 100 MG tablet Commonly known as: JANUVIA       TAKE these medications    acetaminophen 500 MG tablet Commonly known as: TYLENOL Take 500-1,000 mg by mouth every 6 (six) hours as needed for moderate pain.   Basaglar KwikPen 100 UNIT/ML Inject 55 Units  into the skin daily. What changed: how much to take   cholecalciferol 1000 units tablet Commonly known as: VITAMIN D Take 1,000 Units by mouth once a week.   fluticasone 50 MCG/ACT nasal spray Commonly known as: FLONASE Place 2 sprays into the nose daily as needed for allergies.   insulin aspart 100 UNIT/ML FlexPen Commonly known as: NOVOLOG Before each meal 3 times a day, 140-199 - 4 units,  200-250 - 6 units, 251-299 - 8 units,  300-349 - 10 units,  350 or above 12 units. Insulin PEN if approved, provide syringes and needles if needed.   levothyroxine 75 MCG tablet Commonly known as: SYNTHROID Take 75 mcg by mouth daily before breakfast.   metFORMIN 1000 MG tablet Commonly known as: GLUCOPHAGE Take 1,000 mg by mouth 2 (two) times daily with a meal.   Ozempic (0.25 or 0.5 MG/DOSE) 2 MG/3ML Sopn Generic drug: Semaglutide(0.25 or 0.'5MG'$ /DOS) Inject 0.25 mg into the skin every Friday. Start taking on: August 08, 2021 What changed: when to take this   pantoprazole 40 MG tablet Commonly known as: PROTONIX Take 40 mg by mouth daily.   rosuvastatin 20 MG tablet Commonly known as: CRESTOR Take 20 mg by mouth daily.         Follow-up Information     Vernie Shanks, MD. Schedule an appointment as soon as possible for a visit in 1 week(s).   Specialty: Family Medicine Contact information: [address] [place] [address] [phone]                 Major procedures and Radiology Reports - PLEASE review detailed and final reports thoroughly  -      MR BRAIN WO CONTRAST  Result Date: 07/31/2021 CLINICAL DATA:  81 year old female code stroke presentation 2 days ago. Altered mental status. EXAM: MRI HEAD WITHOUT CONTRAST TECHNIQUE: Multiplanar, multiecho pulse sequences of the brain and surrounding structures were obtained without intravenous contrast. COMPARISON:  Head CT 07/29/2021.  Brain MRI 03/02/2008. FINDINGS: Brain: Overall cerebral volume is normal for  age. No restricted diffusion to suggest acute infarction. No midline shift, mass effect, evidence of mass lesion, ventriculomegaly, extra-axial collection or acute intracranial hemorrhage. Cervicomedullary junction and pituitary are within normal limits. [place] Cables and white matter signal throughout the brain remains normal for age, not significantly changed from 2010. No definite cortical encephalomalacia or chronic cerebral blood products. Vascular: Major intracranial vascular flow voids are stable since 2010. Skull and upper cervical spine: Negative visible cervical spine. Visualized bone marrow signal is within normal limits. Sinuses/Orbits: Negative orbits. Paranasal Visualized paranasal sinuses and mastoids are stable and well aerated. Other: Grossly normal visible internal auditory structures. Negative visible scalp and face. IMPRESSION: No acute intracranial abnormality and normal for age noncontrast MRI appearance of the brain. Electronically Signed   By: Genevie Ann M.D.   On: 07/31/2021 11:11   CT HEAD CODE STROKE WO CONTRAST  Result Date: 07/29/2021 CLINICAL DATA:  Code stroke. Initial evaluation for neuro deficit, stroke suspected EXAM: CT HEAD WITHOUT CONTRAST TECHNIQUE: Contiguous axial images were obtained from the base of the skull through the vertex without intravenous contrast. RADIATION DOSE REDUCTION: This exam was performed according to the departmental dose-optimization program which includes automated exposure control, adjustment of the mA and/or kV according to patient size and/or use of iterative reconstruction technique. COMPARISON:  Prior study from 03/02/2008. FINDINGS: Brain: Cerebral volume within normal limits. No acute intracranial hemorrhage. No acute large vessel territory infarct. No mass lesion or midline shift. No hydrocephalus or extra-axial fluid collection. Vascular: No hyperdense vessel. Skull: Scalp soft tissues and calvarium within normal limits. Sinuses/Orbits: Globes and  orbital soft tissues within normal limits. Paranasal sinuses are clear. No significant mastoid effusion. Other: None. ASPECTS [facility] Stroke Program Early CT Score) - Ganglionic level infarction (caudate, lentiform nuclei, internal capsule, insula, M1-M3 cortex): 7 - Supraganglionic infarction (M4-M6 cortex): 3 Total score (0-10 with 10 being  normal): 10 IMPRESSION: 1. Negative head CT.  No acute intracranial abnormality. 2. ASPECTS is 10. These results were communicated to Dr. Leonel Ramsay at 9:13 pm on 07/29/2021 by text page via the [facility] messaging system. Electronically Signed   By: Jeannine Boga M.D.   On: 07/29/2021 21:14   DG Chest Portable 1 View  Result Date: 07/29/2021 CLINICAL DATA:  Altered mental status EXAM: PORTABLE CHEST 1 VIEW COMPARISON:  None Available. FINDINGS: The cardiomediastinal silhouette is within normal limits. There is no focal airspace disease. There is no pleural effusion. No pneumothorax. There is no acute osseous abnormality. Thoracic spondylosis. IMPRESSION: No evidence of acute cardiopulmonary disease. Electronically Signed   By: Maurine Simmering M.D.   On: 07/29/2021 14:57       Today   Subjective    Jennifer Boyer today has no headache,no chest abdominal pain,no new weakness tingling or numbness, feels much better     Objective   Blood pressure 122/62, pulse 79, temperature 98.8 F (37.1 C), temperature source Oral, resp. rate (!) 26, height '5\' 7"'$  (1.702 m), weight 104.3 kg, SpO2 93 %.   Intake/Output Summary (Last 24 hours) at 08/05/2021 0852 Last data filed at 08/05/2021 0500 Gross per 24 hour  Intake --  Output 1000 ml  Net -1000 ml    Exam  Awake Alert x 2, No new F.N deficits,    [place].AT,PERRAL Supple Neck,   Symmetrical Chest wall movement, Good air movement bilaterally, CTAB RRR,No Gallops,   +ve B.Sounds, Abd Soft, Non tender,  No Cyanosis, Clubbing or edema    Data Review   Recent Labs  Lab 07/29/21 1453 07/29/21 1540  07/29/21 1550 07/30/21 0157 07/31/21 0022 08/05/21 0639  WBC 9.1  --   --  7.6 5.1 5.8  HGB 13.3 15.3*  15.3* 14.6 10.6* 10.5* 11.5*  HCT 44.7 45.0  45.0 43.0 32.0* 32.0* 35.7*  PLT 231  --   --  178 135* 209  MCV 107.5*  --   --  97.3 95.2 98.1  MCH 32.0  --   --  32.2 31.3 31.6  MCHC 29.8*  --   --  33.1 32.8 32.2  RDW 15.0  --   --  14.9 15.3 14.9  LYMPHSABS 0.7  --   --  1.7  --   --   MONOABS 0.5  --   --  0.6  --   --   EOSABS 0.0  --   --  0.0  --   --   BASOSABS 0.0  --   --  0.0  --   --     Recent Labs  Lab 07/29/21 2222 07/30/21 0157 07/30/21 1110 07/31/21 0022 08/01/21 0039 08/02/21 0146 08/02/21 0740 08/03/21 0035 08/05/21 0639  NA 136 136 137 136 136 137 137 132* 137  K 3.4* 3.4* 3.0* 3.2* 3.1* 5.0 4.5 4.1 4.6  CL 106 106 107 100 103 103 100 97* 102  CO2 10* 15* 23 21* '28 30 27 27 27  '$ GLUCOSE 156* 135* 155* 429* 210* 204* 230* 210* 149*  BUN 36* 32* 27* '17 10 8 9 11 11  '$ CREATININE 1.33* 1.17* 0.86 0.92 0.72 0.80 0.69 0.71 0.72  CALCIUM 8.5* 7.9* 7.9* 8.0* 8.4* 9.0 8.9 8.7* 9.1  AST  --   --   --   --  16  --   --   --   --   ALT  --   --   --   --  12  --   --   --   --   ALKPHOS  --   --   --   --  56  --   --   --   --   BILITOT  --   --   --   --  0.4  --   --   --   --   ALBUMIN  --   --   --   --  2.4*  --   --   --   --   MG 1.7 1.7 1.8 2.2 2.0 2.0 2.1 1.7 1.9  PHOS 1.6* 1.3* 1.3* 2.1* 1.9* 4.1  --   --   --   TSH 2.073  --   --   --   --   --   --   --   --   HGBA1C >15.5*  --   --   --   --   --   --   --   --   BNP  --   --   --   --   --   --   --   --  32.8    Total Time in preparing paper work, data evaluation and todays exam - 35 minutes  Lala Lund M.D on 08/05/2021 at 8:52 AM  [facility]

## 2021-08-05 NOTE — Progress Notes (Signed)
1135: call placed to [facility] for report, answered and transferred to receiving nurse no answer.

## 2021-08-05 NOTE — TOC Progression Note (Addendum)
Transition of Care [facility]) - Progression Note    Patient Details  Name: DELAYZA LUNGREN MRN: [id number] Date of Birth: 1940-07-02  [facility]) CM/SW [place], LCSW Phone Number: 08/05/2021, 8:42 AM  Clinical Narrative:    [facility] has received insurance approval. Updated patient's daughter and she reported agreement with [facility] for transport. CSW emailed her list of in-network psychiatry facilities ([email or web address]).    Expected Discharge Plan: Skilled Nursing Facility Barriers to Discharge: Ship broker, Continued Medical Work up, SNF Pending bed offer  Expected Discharge Plan and Services Expected Discharge Plan: [place] In-house Referral: Clinical Social Work   Post Acute Care Choice: [place] Living arrangements for the past 2 months: Single Family Home                                       Social Determinants of Health (SDOH) Interventions    Readmission Risk Interventions     No data to display

## 2021-08-05 NOTE — TOC Transition Note (Signed)
Transition of Care [facility]) - CM/SW Discharge Note   Patient Details  Name: Jennifer Boyer MRN: [id number] Date of Birth: 03/08/1940  [facility]) CM/SW Contact:  Benard Halsted, LCSW Phone Number: 08/05/2021, 11:19 AM   Clinical Narrative:    [facility] to[facility] Anticipated DC date: 08/05/21 Family notified: Daughter, Tax inspector by: Corey Harold   Per MD patient ready for DC to [place]. RN to call report prior to discharge [phone] room 206). RN, patient, patient's family, and facility notified of DC. Discharge Summary and FL2 sent to facility. DC packet on chart. Ambulance transport requested for patient.   CSW will sign off for now as social work intervention is no longer needed. Please consult [place] again if new needs arise.     Final next level of care: Skilled Nursing Facility Barriers to Discharge: Barriers Resolved   Patient Goals and CMS Choice Patient states their goals for this hospitalization and ongoing recovery are:: Rehab CMS Medicare.gov Compare Post Acute Care list provided to:: Patient Represenative (must comment) Choice offered to / list presented to : Adult Children  Discharge Placement   Existing PASRR number confirmed : 08/05/21          Patient chooses bed at: [facility] Patient to be transferred to facility by: [place] Name of family member notified: Daughter Patient and family notified of of transfer: 08/05/21  Discharge Plan and Services In-house Referral: Clinical Social Work   Post Acute Care Choice: [place]                               Social Determinants of Health (SDOH) Interventions     Readmission Risk Interventions     No data to display

## 2021-08-06 DIAGNOSIS — E1159 Type 2 diabetes mellitus with other circulatory complications: Secondary | ICD-10-CM | POA: Diagnosis not present

## 2021-08-06 DIAGNOSIS — E1110 Type 2 diabetes mellitus with ketoacidosis without coma: Secondary | ICD-10-CM | POA: Diagnosis not present

## 2021-08-06 DIAGNOSIS — R4189 Other symptoms and signs involving cognitive functions and awareness: Secondary | ICD-10-CM | POA: Diagnosis not present

## 2021-08-06 DIAGNOSIS — E039 Hypothyroidism, unspecified: Secondary | ICD-10-CM | POA: Diagnosis not present

## 2021-08-06 DIAGNOSIS — I152 Hypertension secondary to endocrine disorders: Secondary | ICD-10-CM | POA: Diagnosis not present

## 2021-08-06 DIAGNOSIS — D539 Nutritional anemia, unspecified: Secondary | ICD-10-CM | POA: Diagnosis not present

## 2021-08-06 DIAGNOSIS — G9341 Metabolic encephalopathy: Secondary | ICD-10-CM | POA: Diagnosis not present

## 2021-08-06 DIAGNOSIS — E785 Hyperlipidemia, unspecified: Secondary | ICD-10-CM | POA: Diagnosis not present

## 2021-08-06 DIAGNOSIS — Z794 Long term (current) use of insulin: Secondary | ICD-10-CM | POA: Diagnosis not present

## 2021-08-11 DIAGNOSIS — R0989 Other specified symptoms and signs involving the circulatory and respiratory systems: Secondary | ICD-10-CM | POA: Diagnosis not present

## 2021-08-11 DIAGNOSIS — E0829 Diabetes mellitus due to underlying condition with other diabetic kidney complication: Secondary | ICD-10-CM | POA: Diagnosis not present

## 2021-08-13 DIAGNOSIS — Z0189 Encounter for other specified special examinations: Secondary | ICD-10-CM | POA: Diagnosis not present

## 2021-08-13 DIAGNOSIS — E119 Type 2 diabetes mellitus without complications: Secondary | ICD-10-CM | POA: Diagnosis not present

## 2021-08-13 DIAGNOSIS — D539 Nutritional anemia, unspecified: Secondary | ICD-10-CM | POA: Diagnosis not present

## 2021-08-15 DIAGNOSIS — D511 Vitamin B12 deficiency anemia due to selective vitamin B12 malabsorption with proteinuria: Secondary | ICD-10-CM | POA: Diagnosis not present

## 2021-08-15 DIAGNOSIS — E0829 Diabetes mellitus due to underlying condition with other diabetic kidney complication: Secondary | ICD-10-CM | POA: Diagnosis not present

## 2021-08-20 DIAGNOSIS — Z9189 Other specified personal risk factors, not elsewhere classified: Secondary | ICD-10-CM | POA: Diagnosis not present

## 2021-08-20 DIAGNOSIS — I152 Hypertension secondary to endocrine disorders: Secondary | ICD-10-CM | POA: Diagnosis not present

## 2021-08-20 DIAGNOSIS — E785 Hyperlipidemia, unspecified: Secondary | ICD-10-CM | POA: Diagnosis not present

## 2021-08-20 DIAGNOSIS — I872 Venous insufficiency (chronic) (peripheral): Secondary | ICD-10-CM | POA: Diagnosis not present

## 2021-08-20 DIAGNOSIS — E039 Hypothyroidism, unspecified: Secondary | ICD-10-CM | POA: Diagnosis not present

## 2021-08-20 DIAGNOSIS — E1159 Type 2 diabetes mellitus with other circulatory complications: Secondary | ICD-10-CM | POA: Diagnosis not present

## 2021-08-20 DIAGNOSIS — R4189 Other symptoms and signs involving cognitive functions and awareness: Secondary | ICD-10-CM | POA: Diagnosis not present

## 2021-08-20 DIAGNOSIS — Z794 Long term (current) use of insulin: Secondary | ICD-10-CM | POA: Diagnosis not present

## 2021-08-20 DIAGNOSIS — D539 Nutritional anemia, unspecified: Secondary | ICD-10-CM | POA: Diagnosis not present

## 2021-08-25 DIAGNOSIS — M5126 Other intervertebral disc displacement, lumbar region: Secondary | ICD-10-CM | POA: Diagnosis not present

## 2021-08-29 DIAGNOSIS — E1165 Type 2 diabetes mellitus with hyperglycemia: Secondary | ICD-10-CM | POA: Diagnosis not present

## 2021-08-29 DIAGNOSIS — E119 Type 2 diabetes mellitus without complications: Secondary | ICD-10-CM | POA: Diagnosis not present

## 2021-08-29 DIAGNOSIS — E1169 Type 2 diabetes mellitus with other specified complication: Secondary | ICD-10-CM | POA: Diagnosis not present

## 2021-08-29 DIAGNOSIS — E785 Hyperlipidemia, unspecified: Secondary | ICD-10-CM | POA: Diagnosis not present

## 2021-08-29 DIAGNOSIS — E038 Other specified hypothyroidism: Secondary | ICD-10-CM | POA: Diagnosis not present

## 2021-08-31 DIAGNOSIS — E1110 Type 2 diabetes mellitus with ketoacidosis without coma: Secondary | ICD-10-CM | POA: Diagnosis not present

## 2021-08-31 DIAGNOSIS — Z9181 History of falling: Secondary | ICD-10-CM | POA: Diagnosis not present

## 2021-08-31 DIAGNOSIS — Z794 Long term (current) use of insulin: Secondary | ICD-10-CM | POA: Diagnosis not present

## 2021-08-31 DIAGNOSIS — E039 Hypothyroidism, unspecified: Secondary | ICD-10-CM | POA: Diagnosis not present

## 2021-08-31 DIAGNOSIS — E1165 Type 2 diabetes mellitus with hyperglycemia: Secondary | ICD-10-CM | POA: Diagnosis not present

## 2021-08-31 DIAGNOSIS — Z7985 Long-term (current) use of injectable non-insulin antidiabetic drugs: Secondary | ICD-10-CM | POA: Diagnosis not present

## 2021-08-31 DIAGNOSIS — E785 Hyperlipidemia, unspecified: Secondary | ICD-10-CM | POA: Diagnosis not present

## 2021-08-31 DIAGNOSIS — I10 Essential (primary) hypertension: Secondary | ICD-10-CM | POA: Diagnosis not present

## 2021-08-31 DIAGNOSIS — Z7984 Long term (current) use of oral hypoglycemic drugs: Secondary | ICD-10-CM | POA: Diagnosis not present

## 2021-08-31 DIAGNOSIS — D539 Nutritional anemia, unspecified: Secondary | ICD-10-CM | POA: Diagnosis not present

## 2021-08-31 DIAGNOSIS — M1990 Unspecified osteoarthritis, unspecified site: Secondary | ICD-10-CM | POA: Diagnosis not present

## 2021-09-01 DIAGNOSIS — Z794 Long term (current) use of insulin: Secondary | ICD-10-CM | POA: Diagnosis not present

## 2021-09-01 DIAGNOSIS — E1165 Type 2 diabetes mellitus with hyperglycemia: Secondary | ICD-10-CM | POA: Diagnosis not present

## 2021-09-01 DIAGNOSIS — Z7984 Long term (current) use of oral hypoglycemic drugs: Secondary | ICD-10-CM | POA: Diagnosis not present

## 2021-09-01 DIAGNOSIS — Z9181 History of falling: Secondary | ICD-10-CM | POA: Diagnosis not present

## 2021-09-01 DIAGNOSIS — E785 Hyperlipidemia, unspecified: Secondary | ICD-10-CM | POA: Diagnosis not present

## 2021-09-01 DIAGNOSIS — M1990 Unspecified osteoarthritis, unspecified site: Secondary | ICD-10-CM | POA: Diagnosis not present

## 2021-09-01 DIAGNOSIS — E039 Hypothyroidism, unspecified: Secondary | ICD-10-CM | POA: Diagnosis not present

## 2021-09-01 DIAGNOSIS — D539 Nutritional anemia, unspecified: Secondary | ICD-10-CM | POA: Diagnosis not present

## 2021-09-01 DIAGNOSIS — Z7985 Long-term (current) use of injectable non-insulin antidiabetic drugs: Secondary | ICD-10-CM | POA: Diagnosis not present

## 2021-09-01 DIAGNOSIS — I10 Essential (primary) hypertension: Secondary | ICD-10-CM | POA: Diagnosis not present

## 2021-09-03 DIAGNOSIS — I10 Essential (primary) hypertension: Secondary | ICD-10-CM | POA: Diagnosis not present

## 2021-09-03 DIAGNOSIS — Z09 Encounter for follow-up examination after completed treatment for conditions other than malignant neoplasm: Secondary | ICD-10-CM | POA: Diagnosis not present

## 2021-09-03 DIAGNOSIS — E1142 Type 2 diabetes mellitus with diabetic polyneuropathy: Secondary | ICD-10-CM | POA: Diagnosis not present

## 2021-09-03 DIAGNOSIS — E1169 Type 2 diabetes mellitus with other specified complication: Secondary | ICD-10-CM | POA: Diagnosis not present

## 2021-09-03 DIAGNOSIS — R69 Illness, unspecified: Secondary | ICD-10-CM | POA: Diagnosis not present

## 2021-09-04 DIAGNOSIS — E785 Hyperlipidemia, unspecified: Secondary | ICD-10-CM | POA: Diagnosis not present

## 2021-09-04 DIAGNOSIS — E039 Hypothyroidism, unspecified: Secondary | ICD-10-CM | POA: Diagnosis not present

## 2021-09-04 DIAGNOSIS — Z7984 Long term (current) use of oral hypoglycemic drugs: Secondary | ICD-10-CM | POA: Diagnosis not present

## 2021-09-04 DIAGNOSIS — Z9181 History of falling: Secondary | ICD-10-CM | POA: Diagnosis not present

## 2021-09-04 DIAGNOSIS — D539 Nutritional anemia, unspecified: Secondary | ICD-10-CM | POA: Diagnosis not present

## 2021-09-04 DIAGNOSIS — Z794 Long term (current) use of insulin: Secondary | ICD-10-CM | POA: Diagnosis not present

## 2021-09-04 DIAGNOSIS — I10 Essential (primary) hypertension: Secondary | ICD-10-CM | POA: Diagnosis not present

## 2021-09-04 DIAGNOSIS — Z7985 Long-term (current) use of injectable non-insulin antidiabetic drugs: Secondary | ICD-10-CM | POA: Diagnosis not present

## 2021-09-04 DIAGNOSIS — M1990 Unspecified osteoarthritis, unspecified site: Secondary | ICD-10-CM | POA: Diagnosis not present

## 2021-09-04 DIAGNOSIS — E1165 Type 2 diabetes mellitus with hyperglycemia: Secondary | ICD-10-CM | POA: Diagnosis not present

## 2021-09-05 DIAGNOSIS — Z7984 Long term (current) use of oral hypoglycemic drugs: Secondary | ICD-10-CM | POA: Diagnosis not present

## 2021-09-05 DIAGNOSIS — E039 Hypothyroidism, unspecified: Secondary | ICD-10-CM | POA: Diagnosis not present

## 2021-09-05 DIAGNOSIS — E785 Hyperlipidemia, unspecified: Secondary | ICD-10-CM | POA: Diagnosis not present

## 2021-09-05 DIAGNOSIS — E1165 Type 2 diabetes mellitus with hyperglycemia: Secondary | ICD-10-CM | POA: Diagnosis not present

## 2021-09-05 DIAGNOSIS — Z7985 Long-term (current) use of injectable non-insulin antidiabetic drugs: Secondary | ICD-10-CM | POA: Diagnosis not present

## 2021-09-05 DIAGNOSIS — Z9181 History of falling: Secondary | ICD-10-CM | POA: Diagnosis not present

## 2021-09-05 DIAGNOSIS — D539 Nutritional anemia, unspecified: Secondary | ICD-10-CM | POA: Diagnosis not present

## 2021-09-05 DIAGNOSIS — Z794 Long term (current) use of insulin: Secondary | ICD-10-CM | POA: Diagnosis not present

## 2021-09-05 DIAGNOSIS — I10 Essential (primary) hypertension: Secondary | ICD-10-CM | POA: Diagnosis not present

## 2021-09-05 DIAGNOSIS — M1990 Unspecified osteoarthritis, unspecified site: Secondary | ICD-10-CM | POA: Diagnosis not present

## 2021-09-09 DIAGNOSIS — Z9181 History of falling: Secondary | ICD-10-CM | POA: Diagnosis not present

## 2021-09-09 DIAGNOSIS — D539 Nutritional anemia, unspecified: Secondary | ICD-10-CM | POA: Diagnosis not present

## 2021-09-09 DIAGNOSIS — I10 Essential (primary) hypertension: Secondary | ICD-10-CM | POA: Diagnosis not present

## 2021-09-09 DIAGNOSIS — M1990 Unspecified osteoarthritis, unspecified site: Secondary | ICD-10-CM | POA: Diagnosis not present

## 2021-09-09 DIAGNOSIS — Z7984 Long term (current) use of oral hypoglycemic drugs: Secondary | ICD-10-CM | POA: Diagnosis not present

## 2021-09-09 DIAGNOSIS — E039 Hypothyroidism, unspecified: Secondary | ICD-10-CM | POA: Diagnosis not present

## 2021-09-09 DIAGNOSIS — Z7985 Long-term (current) use of injectable non-insulin antidiabetic drugs: Secondary | ICD-10-CM | POA: Diagnosis not present

## 2021-09-09 DIAGNOSIS — E785 Hyperlipidemia, unspecified: Secondary | ICD-10-CM | POA: Diagnosis not present

## 2021-09-09 DIAGNOSIS — E1165 Type 2 diabetes mellitus with hyperglycemia: Secondary | ICD-10-CM | POA: Diagnosis not present

## 2021-09-09 DIAGNOSIS — Z794 Long term (current) use of insulin: Secondary | ICD-10-CM | POA: Diagnosis not present

## 2021-09-11 DIAGNOSIS — Z9181 History of falling: Secondary | ICD-10-CM | POA: Diagnosis not present

## 2021-09-11 DIAGNOSIS — M1990 Unspecified osteoarthritis, unspecified site: Secondary | ICD-10-CM | POA: Diagnosis not present

## 2021-09-11 DIAGNOSIS — Z794 Long term (current) use of insulin: Secondary | ICD-10-CM | POA: Diagnosis not present

## 2021-09-11 DIAGNOSIS — Z7985 Long-term (current) use of injectable non-insulin antidiabetic drugs: Secondary | ICD-10-CM | POA: Diagnosis not present

## 2021-09-11 DIAGNOSIS — E785 Hyperlipidemia, unspecified: Secondary | ICD-10-CM | POA: Diagnosis not present

## 2021-09-11 DIAGNOSIS — I10 Essential (primary) hypertension: Secondary | ICD-10-CM | POA: Diagnosis not present

## 2021-09-11 DIAGNOSIS — E039 Hypothyroidism, unspecified: Secondary | ICD-10-CM | POA: Diagnosis not present

## 2021-09-11 DIAGNOSIS — D539 Nutritional anemia, unspecified: Secondary | ICD-10-CM | POA: Diagnosis not present

## 2021-09-11 DIAGNOSIS — E1165 Type 2 diabetes mellitus with hyperglycemia: Secondary | ICD-10-CM | POA: Diagnosis not present

## 2021-09-11 DIAGNOSIS — Z7984 Long term (current) use of oral hypoglycemic drugs: Secondary | ICD-10-CM | POA: Diagnosis not present

## 2021-09-12 DIAGNOSIS — D539 Nutritional anemia, unspecified: Secondary | ICD-10-CM | POA: Diagnosis not present

## 2021-09-12 DIAGNOSIS — M1990 Unspecified osteoarthritis, unspecified site: Secondary | ICD-10-CM | POA: Diagnosis not present

## 2021-09-12 DIAGNOSIS — E1165 Type 2 diabetes mellitus with hyperglycemia: Secondary | ICD-10-CM | POA: Diagnosis not present

## 2021-09-12 DIAGNOSIS — Z7985 Long-term (current) use of injectable non-insulin antidiabetic drugs: Secondary | ICD-10-CM | POA: Diagnosis not present

## 2021-09-12 DIAGNOSIS — I10 Essential (primary) hypertension: Secondary | ICD-10-CM | POA: Diagnosis not present

## 2021-09-12 DIAGNOSIS — Z7984 Long term (current) use of oral hypoglycemic drugs: Secondary | ICD-10-CM | POA: Diagnosis not present

## 2021-09-12 DIAGNOSIS — Z9181 History of falling: Secondary | ICD-10-CM | POA: Diagnosis not present

## 2021-09-12 DIAGNOSIS — E039 Hypothyroidism, unspecified: Secondary | ICD-10-CM | POA: Diagnosis not present

## 2021-09-12 DIAGNOSIS — E785 Hyperlipidemia, unspecified: Secondary | ICD-10-CM | POA: Diagnosis not present

## 2021-09-12 DIAGNOSIS — Z794 Long term (current) use of insulin: Secondary | ICD-10-CM | POA: Diagnosis not present

## 2021-09-15 DIAGNOSIS — I10 Essential (primary) hypertension: Secondary | ICD-10-CM | POA: Diagnosis not present

## 2021-09-15 DIAGNOSIS — E1165 Type 2 diabetes mellitus with hyperglycemia: Secondary | ICD-10-CM | POA: Diagnosis not present

## 2021-09-15 DIAGNOSIS — E039 Hypothyroidism, unspecified: Secondary | ICD-10-CM | POA: Diagnosis not present

## 2021-09-15 DIAGNOSIS — Z794 Long term (current) use of insulin: Secondary | ICD-10-CM | POA: Diagnosis not present

## 2021-09-15 DIAGNOSIS — Z9181 History of falling: Secondary | ICD-10-CM | POA: Diagnosis not present

## 2021-09-15 DIAGNOSIS — Z7984 Long term (current) use of oral hypoglycemic drugs: Secondary | ICD-10-CM | POA: Diagnosis not present

## 2021-09-15 DIAGNOSIS — Z7985 Long-term (current) use of injectable non-insulin antidiabetic drugs: Secondary | ICD-10-CM | POA: Diagnosis not present

## 2021-09-15 DIAGNOSIS — M1990 Unspecified osteoarthritis, unspecified site: Secondary | ICD-10-CM | POA: Diagnosis not present

## 2021-09-15 DIAGNOSIS — D539 Nutritional anemia, unspecified: Secondary | ICD-10-CM | POA: Diagnosis not present

## 2021-09-15 DIAGNOSIS — E785 Hyperlipidemia, unspecified: Secondary | ICD-10-CM | POA: Diagnosis not present

## 2021-09-16 DIAGNOSIS — Z794 Long term (current) use of insulin: Secondary | ICD-10-CM | POA: Diagnosis not present

## 2021-09-16 DIAGNOSIS — Z9181 History of falling: Secondary | ICD-10-CM | POA: Diagnosis not present

## 2021-09-16 DIAGNOSIS — Z7985 Long-term (current) use of injectable non-insulin antidiabetic drugs: Secondary | ICD-10-CM | POA: Diagnosis not present

## 2021-09-16 DIAGNOSIS — E039 Hypothyroidism, unspecified: Secondary | ICD-10-CM | POA: Diagnosis not present

## 2021-09-16 DIAGNOSIS — Z7984 Long term (current) use of oral hypoglycemic drugs: Secondary | ICD-10-CM | POA: Diagnosis not present

## 2021-09-16 DIAGNOSIS — E1165 Type 2 diabetes mellitus with hyperglycemia: Secondary | ICD-10-CM | POA: Diagnosis not present

## 2021-09-16 DIAGNOSIS — E785 Hyperlipidemia, unspecified: Secondary | ICD-10-CM | POA: Diagnosis not present

## 2021-09-16 DIAGNOSIS — D539 Nutritional anemia, unspecified: Secondary | ICD-10-CM | POA: Diagnosis not present

## 2021-09-16 DIAGNOSIS — I10 Essential (primary) hypertension: Secondary | ICD-10-CM | POA: Diagnosis not present

## 2021-09-16 DIAGNOSIS — M1990 Unspecified osteoarthritis, unspecified site: Secondary | ICD-10-CM | POA: Diagnosis not present

## 2021-09-18 DIAGNOSIS — Z9181 History of falling: Secondary | ICD-10-CM | POA: Diagnosis not present

## 2021-09-18 DIAGNOSIS — E1165 Type 2 diabetes mellitus with hyperglycemia: Secondary | ICD-10-CM | POA: Diagnosis not present

## 2021-09-18 DIAGNOSIS — Z7984 Long term (current) use of oral hypoglycemic drugs: Secondary | ICD-10-CM | POA: Diagnosis not present

## 2021-09-18 DIAGNOSIS — E039 Hypothyroidism, unspecified: Secondary | ICD-10-CM | POA: Diagnosis not present

## 2021-09-18 DIAGNOSIS — E785 Hyperlipidemia, unspecified: Secondary | ICD-10-CM | POA: Diagnosis not present

## 2021-09-18 DIAGNOSIS — M1990 Unspecified osteoarthritis, unspecified site: Secondary | ICD-10-CM | POA: Diagnosis not present

## 2021-09-18 DIAGNOSIS — D539 Nutritional anemia, unspecified: Secondary | ICD-10-CM | POA: Diagnosis not present

## 2021-09-18 DIAGNOSIS — Z794 Long term (current) use of insulin: Secondary | ICD-10-CM | POA: Diagnosis not present

## 2021-09-18 DIAGNOSIS — I10 Essential (primary) hypertension: Secondary | ICD-10-CM | POA: Diagnosis not present

## 2021-09-18 DIAGNOSIS — Z7985 Long-term (current) use of injectable non-insulin antidiabetic drugs: Secondary | ICD-10-CM | POA: Diagnosis not present

## 2021-09-23 DIAGNOSIS — E1165 Type 2 diabetes mellitus with hyperglycemia: Secondary | ICD-10-CM | POA: Diagnosis not present

## 2021-09-23 DIAGNOSIS — Z7984 Long term (current) use of oral hypoglycemic drugs: Secondary | ICD-10-CM | POA: Diagnosis not present

## 2021-09-23 DIAGNOSIS — I10 Essential (primary) hypertension: Secondary | ICD-10-CM | POA: Diagnosis not present

## 2021-09-23 DIAGNOSIS — Z7985 Long-term (current) use of injectable non-insulin antidiabetic drugs: Secondary | ICD-10-CM | POA: Diagnosis not present

## 2021-09-23 DIAGNOSIS — Z794 Long term (current) use of insulin: Secondary | ICD-10-CM | POA: Diagnosis not present

## 2021-09-23 DIAGNOSIS — E785 Hyperlipidemia, unspecified: Secondary | ICD-10-CM | POA: Diagnosis not present

## 2021-09-23 DIAGNOSIS — D539 Nutritional anemia, unspecified: Secondary | ICD-10-CM | POA: Diagnosis not present

## 2021-09-23 DIAGNOSIS — E039 Hypothyroidism, unspecified: Secondary | ICD-10-CM | POA: Diagnosis not present

## 2021-09-23 DIAGNOSIS — M1990 Unspecified osteoarthritis, unspecified site: Secondary | ICD-10-CM | POA: Diagnosis not present

## 2021-09-23 DIAGNOSIS — Z9181 History of falling: Secondary | ICD-10-CM | POA: Diagnosis not present

## 2021-09-25 DIAGNOSIS — Z7984 Long term (current) use of oral hypoglycemic drugs: Secondary | ICD-10-CM | POA: Diagnosis not present

## 2021-09-25 DIAGNOSIS — D539 Nutritional anemia, unspecified: Secondary | ICD-10-CM | POA: Diagnosis not present

## 2021-09-25 DIAGNOSIS — E785 Hyperlipidemia, unspecified: Secondary | ICD-10-CM | POA: Diagnosis not present

## 2021-09-25 DIAGNOSIS — E039 Hypothyroidism, unspecified: Secondary | ICD-10-CM | POA: Diagnosis not present

## 2021-09-25 DIAGNOSIS — I10 Essential (primary) hypertension: Secondary | ICD-10-CM | POA: Diagnosis not present

## 2021-09-25 DIAGNOSIS — Z7985 Long-term (current) use of injectable non-insulin antidiabetic drugs: Secondary | ICD-10-CM | POA: Diagnosis not present

## 2021-09-25 DIAGNOSIS — M1990 Unspecified osteoarthritis, unspecified site: Secondary | ICD-10-CM | POA: Diagnosis not present

## 2021-09-25 DIAGNOSIS — E1165 Type 2 diabetes mellitus with hyperglycemia: Secondary | ICD-10-CM | POA: Diagnosis not present

## 2021-09-25 DIAGNOSIS — Z9181 History of falling: Secondary | ICD-10-CM | POA: Diagnosis not present

## 2021-09-25 DIAGNOSIS — Z794 Long term (current) use of insulin: Secondary | ICD-10-CM | POA: Diagnosis not present

## 2021-09-25 DIAGNOSIS — M5126 Other intervertebral disc displacement, lumbar region: Secondary | ICD-10-CM | POA: Diagnosis not present

## 2021-09-26 DIAGNOSIS — E119 Type 2 diabetes mellitus without complications: Secondary | ICD-10-CM | POA: Diagnosis not present

## 2021-09-26 DIAGNOSIS — Z7984 Long term (current) use of oral hypoglycemic drugs: Secondary | ICD-10-CM | POA: Diagnosis not present

## 2021-09-26 DIAGNOSIS — M1990 Unspecified osteoarthritis, unspecified site: Secondary | ICD-10-CM | POA: Diagnosis not present

## 2021-09-26 DIAGNOSIS — E039 Hypothyroidism, unspecified: Secondary | ICD-10-CM | POA: Diagnosis not present

## 2021-09-26 DIAGNOSIS — E669 Obesity, unspecified: Secondary | ICD-10-CM | POA: Diagnosis not present

## 2021-09-26 DIAGNOSIS — R69 Illness, unspecified: Secondary | ICD-10-CM | POA: Diagnosis not present

## 2021-09-26 DIAGNOSIS — Z809 Family history of malignant neoplasm, unspecified: Secondary | ICD-10-CM | POA: Diagnosis not present

## 2021-09-26 DIAGNOSIS — K219 Gastro-esophageal reflux disease without esophagitis: Secondary | ICD-10-CM | POA: Diagnosis not present

## 2021-09-26 DIAGNOSIS — Z794 Long term (current) use of insulin: Secondary | ICD-10-CM | POA: Diagnosis not present

## 2021-09-26 DIAGNOSIS — Z6832 Body mass index (BMI) 32.0-32.9, adult: Secondary | ICD-10-CM | POA: Diagnosis not present

## 2021-09-26 DIAGNOSIS — Z811 Family history of alcohol abuse and dependence: Secondary | ICD-10-CM | POA: Diagnosis not present

## 2021-09-26 DIAGNOSIS — Z008 Encounter for other general examination: Secondary | ICD-10-CM | POA: Diagnosis not present

## 2021-09-26 DIAGNOSIS — E785 Hyperlipidemia, unspecified: Secondary | ICD-10-CM | POA: Diagnosis not present

## 2021-09-29 DIAGNOSIS — I10 Essential (primary) hypertension: Secondary | ICD-10-CM | POA: Diagnosis not present

## 2021-09-29 DIAGNOSIS — E1169 Type 2 diabetes mellitus with other specified complication: Secondary | ICD-10-CM | POA: Diagnosis not present

## 2021-09-29 DIAGNOSIS — E038 Other specified hypothyroidism: Secondary | ICD-10-CM | POA: Diagnosis not present

## 2021-09-29 DIAGNOSIS — E1165 Type 2 diabetes mellitus with hyperglycemia: Secondary | ICD-10-CM | POA: Diagnosis not present

## 2021-09-30 DIAGNOSIS — E039 Hypothyroidism, unspecified: Secondary | ICD-10-CM | POA: Diagnosis not present

## 2021-09-30 DIAGNOSIS — M1990 Unspecified osteoarthritis, unspecified site: Secondary | ICD-10-CM | POA: Diagnosis not present

## 2021-09-30 DIAGNOSIS — Z7985 Long-term (current) use of injectable non-insulin antidiabetic drugs: Secondary | ICD-10-CM | POA: Diagnosis not present

## 2021-09-30 DIAGNOSIS — E785 Hyperlipidemia, unspecified: Secondary | ICD-10-CM | POA: Diagnosis not present

## 2021-09-30 DIAGNOSIS — D539 Nutritional anemia, unspecified: Secondary | ICD-10-CM | POA: Diagnosis not present

## 2021-09-30 DIAGNOSIS — E1165 Type 2 diabetes mellitus with hyperglycemia: Secondary | ICD-10-CM | POA: Diagnosis not present

## 2021-09-30 DIAGNOSIS — Z794 Long term (current) use of insulin: Secondary | ICD-10-CM | POA: Diagnosis not present

## 2021-09-30 DIAGNOSIS — I10 Essential (primary) hypertension: Secondary | ICD-10-CM | POA: Diagnosis not present

## 2021-09-30 DIAGNOSIS — Z7984 Long term (current) use of oral hypoglycemic drugs: Secondary | ICD-10-CM | POA: Diagnosis not present

## 2021-09-30 DIAGNOSIS — Z9181 History of falling: Secondary | ICD-10-CM | POA: Diagnosis not present

## 2021-10-09 DIAGNOSIS — Z23 Encounter for immunization: Secondary | ICD-10-CM | POA: Diagnosis not present

## 2021-10-09 DIAGNOSIS — Z6831 Body mass index (BMI) 31.0-31.9, adult: Secondary | ICD-10-CM | POA: Diagnosis not present

## 2021-10-09 DIAGNOSIS — F3341 Major depressive disorder, recurrent, in partial remission: Secondary | ICD-10-CM | POA: Diagnosis not present

## 2021-10-09 DIAGNOSIS — R69 Illness, unspecified: Secondary | ICD-10-CM | POA: Diagnosis not present

## 2021-10-13 DIAGNOSIS — E785 Hyperlipidemia, unspecified: Secondary | ICD-10-CM | POA: Diagnosis not present

## 2021-10-13 DIAGNOSIS — Z9181 History of falling: Secondary | ICD-10-CM | POA: Diagnosis not present

## 2021-10-13 DIAGNOSIS — I10 Essential (primary) hypertension: Secondary | ICD-10-CM | POA: Diagnosis not present

## 2021-10-13 DIAGNOSIS — Z7984 Long term (current) use of oral hypoglycemic drugs: Secondary | ICD-10-CM | POA: Diagnosis not present

## 2021-10-13 DIAGNOSIS — D539 Nutritional anemia, unspecified: Secondary | ICD-10-CM | POA: Diagnosis not present

## 2021-10-13 DIAGNOSIS — Z7985 Long-term (current) use of injectable non-insulin antidiabetic drugs: Secondary | ICD-10-CM | POA: Diagnosis not present

## 2021-10-13 DIAGNOSIS — Z794 Long term (current) use of insulin: Secondary | ICD-10-CM | POA: Diagnosis not present

## 2021-10-13 DIAGNOSIS — M1990 Unspecified osteoarthritis, unspecified site: Secondary | ICD-10-CM | POA: Diagnosis not present

## 2021-10-13 DIAGNOSIS — E039 Hypothyroidism, unspecified: Secondary | ICD-10-CM | POA: Diagnosis not present

## 2021-10-13 DIAGNOSIS — E1165 Type 2 diabetes mellitus with hyperglycemia: Secondary | ICD-10-CM | POA: Diagnosis not present

## 2021-10-14 ENCOUNTER — Ambulatory Visit: Payer: Medicare HMO | Admitting: Diagnostic Neuroimaging

## 2021-10-14 ENCOUNTER — Encounter: Payer: Self-pay | Admitting: Diagnostic Neuroimaging

## 2021-10-14 VITALS — BP 128/74 | HR 79 | Ht 66.5 in | Wt 200.0 lb

## 2021-10-14 DIAGNOSIS — R69 Illness, unspecified: Secondary | ICD-10-CM | POA: Diagnosis not present

## 2021-10-14 DIAGNOSIS — F03A0 Unspecified dementia, mild, without behavioral disturbance, psychotic disturbance, mood disturbance, and anxiety: Secondary | ICD-10-CM

## 2021-10-14 NOTE — Progress Notes (Signed)
[facility]  PATIENT: Jennifer Boyer DOB: [birth year]  REFERRING CLINICIAN: Jonetta Osgood, MD HISTORY FROM: patient, caregiver Ulis Rias), daughter (christian, via phone) REASON FOR VISIT: new consult   HISTORICAL  CHIEF COMPLAINT:  Chief Complaint  Patient presents with   Cognitive dysfunction    Rm 7 New Pt  caregiver- Kaye  MMSE 20    HISTORY OF PRESENT ILLNESS:   81 year old female here for evaluation of memory loss.  Patient denies any memory problems.  She is here with caregiver today.  Daughter is present via phone.  Summer 2022 had onset of confusion and memory lapse issues.  This is progressively worsened over time.  Symptoms worsened throughout the past year.  Mainly having short-term memory loss, forgetfulness, not able to manage her medications.  She ended up in the hospital with hyperglycemia.  She was referred to neurology clinic in July 2023.  Symptoms worse lately having some increased stress.  Patient's husband has dementia currently living in memory care.   REVIEW OF SYSTEMS: Full 14 system review of systems performed and negative with exception of: as per HPI.  ALLERGIES: No Known Allergies  HOME MEDICATIONS: Outpatient Medications Prior to Visit  Medication Sig Dispense Refill   acetaminophen (TYLENOL) 500 MG tablet Take 500-1,000 mg by mouth every 6 (six) hours as needed for moderate pain.     cholecalciferol (VITAMIN D) 1000 units tablet Take 1,000 Units by mouth once a week.      escitalopram (LEXAPRO) 10 MG tablet Take 10 mg by mouth daily.     insulin aspart (NOVOLOG) 100 UNIT/ML FlexPen Before each meal 3 times a day, 140-199 - 4 units, 200-250 - 6 units, 251-299 - 8 units,  300-349 - 10 units,  350 or above 12 units. Insulin PEN if approved, provide syringes and needles if needed. 15 mL 0   Insulin Glargine (BASAGLAR KWIKPEN) 100 UNIT/ML Inject 55 Units into the skin daily.     levothyroxine (SYNTHROID, LEVOTHROID) 75 MCG  tablet Take 75 mcg by mouth daily before breakfast.     metFORMIN (GLUCOPHAGE) 1000 MG tablet Take 1,000 mg by mouth 2 (two) times daily with a meal.     pantoprazole (PROTONIX) 40 MG tablet Take 40 mg by mouth daily.     rosuvastatin (CRESTOR) 20 MG tablet Take 20 mg by mouth daily.     fluticasone (FLONASE) 50 MCG/ACT nasal spray Place 2 sprays into the nose daily as needed for allergies.  (Patient not taking: Reported on 10/14/2021)     Semaglutide,0.25 or 0.'5MG'$ /DOS, (OZEMPIC, 0.25 OR 0.5 MG/DOSE,) 2 MG/3ML SOPN Inject 0.25 mg into the skin every Friday.     No facility-administered medications prior to visit.    PAST MEDICAL HISTORY: Past Medical History:  Diagnosis Date   Arthritis    OA   Complication of anesthesia    pt has been told she is hard to intubate.   Diabetes mellitus without complication ([place])    Difficult intubation    Fibromyalgia    GERD (gastroesophageal reflux disease)    Headache    HX MIGRAINES  NONE IN LONG TIME   History of hiatal hernia    Hypertension    Hypothyroidism     PAST SURGICAL HISTORY: Past Surgical History:  Procedure Laterality Date   BREAST SURGERY     bilateral mastectomy   CHOLECYSTECTOMY     DILATATION & CURETTAGE/HYSTEROSCOPY WITH TRUECLEAR N/A 09/30/2012   Procedure: DILATATION & CURETTAGE/HYSTEROSCOPY WITH TRUECLEAR;  Surgeon:  Shon Millet II, MD;  Location: [facility];  Service: Gynecology;  Laterality: N/A;   DILATION AND CURETTAGE OF UTERUS     uterine polyp   KNEE SURGERY     LUMBAR LAMINECTOMY/DECOMPRESSION MICRODISCECTOMY Left 03/24/2017   Procedure: LEFT LUMBAR FIVE-SACRAL ONE MICRODISCECTOMY, LEFT LUMBAR FOUR-FIVE DECOMPRESSIVE LAMINECTOMY;  Surgeon: Kary Kos, MD;  Location: [facility];  Service: Neurosurgery;  Laterality: Left;  Left L4-5 L5-S1 Laminectomy/Foraminotomy   LUMBAR LAMINECTOMY/DECOMPRESSION MICRODISCECTOMY Left 09/01/2017   Procedure: Microdiscectomy - Lumbar Five-Sacral One - left redo;  Surgeon: Kary Kos, MD;   Location: [facility];  Service: Neurosurgery;  Laterality: Left;  left   TONSILLECTOMY      FAMILY HISTORY: History reviewed. No pertinent family history.  SOCIAL HISTORY: Social History   Socioeconomic History   Marital status: Married    Spouse name: Not on file   Number of children: 2   Years of education: 12   Highest education level: Not on file  Occupational History   Not on file  Tobacco Use   Smoking status: Never   Smokeless tobacco: Never  Vaping Use   Vaping Use: Never used  Substance and Sexual Activity   Alcohol use: No   Drug use: No   Sexual activity: Not on file  Other Topics Concern   Not on file  Social History Narrative   10/14/21 lives alone with cat, has caregiver, Bernadette Hoit- Fri till 4 pm, son comes on Thurs afternoons, husband lives at [place] Determinants of Health   Financial Resource Strain: Not on file  Food Insecurity: Not on file  Transportation Needs: Not on file  Physical Activity: Not on file  Stress: Not on file  Social Connections: Not on file  Intimate Partner Violence: Not on file     PHYSICAL EXAM  GENERAL EXAM/CONSTITUTIONAL: Vitals:  Vitals:   10/14/21 1153  BP: 128/74  Pulse: 79  Weight: 200 lb (90.7 kg)  Height: 5' 6.5" (1.689 m)   Body mass index is 31.8 kg/m. Wt Readings from Last 3 Encounters:  10/14/21 200 lb (90.7 kg)  07/29/21 230 lb (104.3 kg)  12/16/20 228 lb (103.4 kg)   Patient is in no distress; well developed, nourished and groomed; neck is supple  CARDIOVASCULAR: Examination of carotid arteries is normal; no carotid bruits Regular rate and rhythm, no murmurs Examination of peripheral vascular system by observation and palpation is normal  EYES: Ophthalmoscopic exam of optic discs and posterior segments is normal; no papilledema or hemorrhages No results found.  MUSCULOSKELETAL: Gait, strength, tone, movements noted in Neurologic exam below  NEUROLOGIC: MENTAL STATUS:      10/14/2021   11:59 AM  MMSE - Mini Mental State Exam  Orientation to time 3  Orientation to Place 4  Registration 3  Attention/ Calculation 2  Recall 1  Language- name 2 objects 2  Language- repeat 0  Language- follow 3 step command 3  Language- read & follow direction 1  Write a sentence 1  Copy design 0  Total score 20   awake, alert, oriented to person, place and time recent and remote memory intact normal attention and concentration language fluent, comprehension intact, naming intact fund of knowledge appropriate  CRANIAL NERVE:  2nd - no papilledema on fundoscopic exam 2nd, 3rd, 4th, 6th - pupils equal and reactive to light, visual fields full to confrontation, extraocular muscles intact, no nystagmus 5th - facial sensation symmetric 7th - facial strength symmetric 8th - hearing intact  9th - palate elevates symmetrically, uvula midline 11th - shoulder shrug symmetric 12th - tongue protrusion midline  MOTOR:  normal bulk and tone, full strength in the BUE, BLE  SENSORY:  normal and symmetric to light touch, temperature, vibration  COORDINATION:  finger-nose-finger, fine finger movements normal  REFLEXES:  deep tendon reflexes TRACE and symmetric  GAIT/STATION:  narrow based gait     DIAGNOSTIC DATA (LABS, IMAGING, TESTING) - I reviewed patient records, labs, notes, testing and imaging myself where available.  Lab Results  Component Value Date   WBC 5.8 08/05/2021   HGB 11.5 (L) 08/05/2021   HCT 35.7 (L) 08/05/2021   MCV 98.1 08/05/2021   PLT 209 08/05/2021      Component Value Date/Time   NA 137 08/05/2021 0639   K 4.6 08/05/2021 0639   CL 102 08/05/2021 0639   CO2 27 08/05/2021 0639   GLUCOSE 149 (H) 08/05/2021 0639   BUN 11 08/05/2021 0639   CREATININE 0.72 08/05/2021 0639   CALCIUM 9.1 08/05/2021 0639   PROT 6.0 (L) 08/01/2021 0039   ALBUMIN 2.4 (L) 08/01/2021 0039   AST 16 08/01/2021 0039   ALT 12 08/01/2021 0039   ALKPHOS 56  08/01/2021 0039   BILITOT 0.4 08/01/2021 0039   GFRNONAA >60 08/05/2021 0639   GFRAA >60 08/24/2017 1032   No results found for: "CHOL", "HDL", "LDLCALC", "LDLDIRECT", "TRIG", "CHOLHDL" Lab Results  Component Value Date   HGBA1C >15.5 (H) 07/29/2021   Lab Results  Component Value Date   VITAMINB12 456 07/29/2021   Lab Results  Component Value Date   TSH 2.073 07/29/2021    07/31/21 MRI brain - No acute intracranial abnormality and normal for age noncontrast MRI appearance of the brain.    ASSESSMENT AND PLAN  81 y.o. year old female here with:   Dx:  1. Mild dementia without behavioral disturbance, psychotic disturbance, mood disturbance, or anxiety, unspecified dementia type ([place])     PLAN:  MILD MEMORY LOSS (mild dementia; MMSE 20/30; decline in ADLs) - safety / supervision issues reviewed - daily physical activity / exercise (at least 15-30 minutes) - eat more plants / vegetables - increase social activities, brain stimulation, games, puzzles, hobbies, crafts, arts, music - aim for at least 7-8 hours sleep per night (or more) - avoid smoking and alcohol - caregiver resources provided - caution with medications, finances, driving - may consider memantine in future  Return in about 4 months (around 02/13/2022).    Penni Bombard, MD 7/67/3419, 37:90 PM Certified in Neurology, Neurophysiology and Neuroimaging  [facility] [address] [place], [place] [address] [phone]

## 2021-10-25 DIAGNOSIS — M5126 Other intervertebral disc displacement, lumbar region: Secondary | ICD-10-CM | POA: Diagnosis not present

## 2021-10-27 ENCOUNTER — Telehealth: Payer: Self-pay | Admitting: *Deleted

## 2021-10-27 NOTE — Telephone Encounter (Signed)
Paper completed, signed and sent to medical records for processing.

## 2021-10-27 NOTE — Telephone Encounter (Signed)
Form on MD desk for completion, signature.

## 2021-10-27 NOTE — Telephone Encounter (Signed)
Received Physician Statement, Terminal or Chronic Illness claim. Called daughter Darrick Meigs, on [place] who stated she and her brother are trying to get Mom in home care, need this documentation. She is able to do all ADL's on her own but forgets she has prepared a meal, daughter afraid she may leave stove on, cannot manage her medications, doesn't drive. Will discuss with MD.

## 2021-10-28 ENCOUNTER — Telehealth: Payer: Self-pay | Admitting: *Deleted

## 2021-10-28 ENCOUNTER — Telehealth: Payer: Self-pay | Admitting: Diagnostic Neuroimaging

## 2021-10-28 NOTE — Telephone Encounter (Signed)
Physician statement faxed to claims desk (fax # [phone])

## 2021-10-28 NOTE — Telephone Encounter (Signed)
Pt claim form@ front desk for p/u

## 2021-10-29 DIAGNOSIS — D539 Nutritional anemia, unspecified: Secondary | ICD-10-CM | POA: Diagnosis not present

## 2021-10-29 DIAGNOSIS — I10 Essential (primary) hypertension: Secondary | ICD-10-CM | POA: Diagnosis not present

## 2021-10-29 DIAGNOSIS — E039 Hypothyroidism, unspecified: Secondary | ICD-10-CM | POA: Diagnosis not present

## 2021-10-29 DIAGNOSIS — E1165 Type 2 diabetes mellitus with hyperglycemia: Secondary | ICD-10-CM | POA: Diagnosis not present

## 2021-10-29 DIAGNOSIS — Z7984 Long term (current) use of oral hypoglycemic drugs: Secondary | ICD-10-CM | POA: Diagnosis not present

## 2021-10-29 DIAGNOSIS — E785 Hyperlipidemia, unspecified: Secondary | ICD-10-CM | POA: Diagnosis not present

## 2021-10-29 DIAGNOSIS — Z794 Long term (current) use of insulin: Secondary | ICD-10-CM | POA: Diagnosis not present

## 2021-10-29 DIAGNOSIS — Z7985 Long-term (current) use of injectable non-insulin antidiabetic drugs: Secondary | ICD-10-CM | POA: Diagnosis not present

## 2021-10-29 DIAGNOSIS — M1990 Unspecified osteoarthritis, unspecified site: Secondary | ICD-10-CM | POA: Diagnosis not present

## 2021-10-29 DIAGNOSIS — Z9181 History of falling: Secondary | ICD-10-CM | POA: Diagnosis not present

## 2021-11-10 DIAGNOSIS — E1159 Type 2 diabetes mellitus with other circulatory complications: Secondary | ICD-10-CM | POA: Diagnosis not present

## 2021-11-10 DIAGNOSIS — E785 Hyperlipidemia, unspecified: Secondary | ICD-10-CM | POA: Diagnosis not present

## 2021-11-10 DIAGNOSIS — E1142 Type 2 diabetes mellitus with diabetic polyneuropathy: Secondary | ICD-10-CM | POA: Diagnosis not present

## 2021-11-10 DIAGNOSIS — K219 Gastro-esophageal reflux disease without esophagitis: Secondary | ICD-10-CM | POA: Diagnosis not present

## 2021-11-10 DIAGNOSIS — I10 Essential (primary) hypertension: Secondary | ICD-10-CM | POA: Diagnosis not present

## 2021-11-21 DIAGNOSIS — E119 Type 2 diabetes mellitus without complications: Secondary | ICD-10-CM | POA: Diagnosis not present

## 2021-11-25 DIAGNOSIS — M5126 Other intervertebral disc displacement, lumbar region: Secondary | ICD-10-CM | POA: Diagnosis not present

## 2021-11-26 DIAGNOSIS — E1165 Type 2 diabetes mellitus with hyperglycemia: Secondary | ICD-10-CM | POA: Diagnosis not present

## 2021-11-26 DIAGNOSIS — E038 Other specified hypothyroidism: Secondary | ICD-10-CM | POA: Diagnosis not present

## 2021-12-01 DIAGNOSIS — I10 Essential (primary) hypertension: Secondary | ICD-10-CM | POA: Diagnosis not present

## 2021-12-01 DIAGNOSIS — E1169 Type 2 diabetes mellitus with other specified complication: Secondary | ICD-10-CM | POA: Diagnosis not present

## 2021-12-01 DIAGNOSIS — E1165 Type 2 diabetes mellitus with hyperglycemia: Secondary | ICD-10-CM | POA: Diagnosis not present

## 2021-12-01 DIAGNOSIS — E038 Other specified hypothyroidism: Secondary | ICD-10-CM | POA: Diagnosis not present

## 2021-12-01 DIAGNOSIS — E785 Hyperlipidemia, unspecified: Secondary | ICD-10-CM | POA: Diagnosis not present

## 2021-12-25 DIAGNOSIS — E1169 Type 2 diabetes mellitus with other specified complication: Secondary | ICD-10-CM | POA: Diagnosis not present

## 2021-12-25 DIAGNOSIS — E039 Hypothyroidism, unspecified: Secondary | ICD-10-CM | POA: Diagnosis not present

## 2021-12-25 DIAGNOSIS — Z6832 Body mass index (BMI) 32.0-32.9, adult: Secondary | ICD-10-CM | POA: Diagnosis not present

## 2021-12-25 DIAGNOSIS — R6889 Other general symptoms and signs: Secondary | ICD-10-CM | POA: Diagnosis not present

## 2021-12-25 DIAGNOSIS — M5126 Other intervertebral disc displacement, lumbar region: Secondary | ICD-10-CM | POA: Diagnosis not present

## 2022-01-25 DIAGNOSIS — M5126 Other intervertebral disc displacement, lumbar region: Secondary | ICD-10-CM | POA: Diagnosis not present

## 2022-02-03 DIAGNOSIS — Z1231 Encounter for screening mammogram for malignant neoplasm of breast: Secondary | ICD-10-CM | POA: Diagnosis not present

## 2022-02-16 ENCOUNTER — Ambulatory Visit: Payer: Medicare HMO | Admitting: Diagnostic Neuroimaging

## 2022-02-16 ENCOUNTER — Encounter: Payer: Self-pay | Admitting: Diagnostic Neuroimaging

## 2022-02-16 VITALS — BP 133/79 | HR 82 | Ht 67.0 in | Wt 198.8 lb

## 2022-02-16 DIAGNOSIS — F03A0 Unspecified dementia, mild, without behavioral disturbance, psychotic disturbance, mood disturbance, and anxiety: Secondary | ICD-10-CM | POA: Diagnosis not present

## 2022-02-16 DIAGNOSIS — R69 Illness, unspecified: Secondary | ICD-10-CM | POA: Diagnosis not present

## 2022-02-16 NOTE — Progress Notes (Signed)
[facility]  PATIENT: Jennifer Boyer DOB: 09/09/1940  REFERRING CLINICIAN: No ref. provider found HISTORY FROM: patient REASON FOR VISIT: follow up   HISTORICAL  CHIEF COMPLAINT:  Chief Complaint  Patient presents with   Follow-up    Patient in room #7 with her daughter. Pt here today to f/u dementia.    HISTORY OF PRESENT ILLNESS:   UPDATE (02/16/22, VRP): Since last visit, doing about the same per patient, but daughter notes progression. Daughter moved here in Oct 2023 to help out. Planning to stay until at least May 2024.   PRIOR HPI (10/14/21): 82 year old female here for evaluation of memory loss.  Patient denies any memory problems.  She is here with caregiver today.  Daughter is present via phone.  Summer 2022 had onset of confusion and memory lapse issues.  This is progressively worsened over time.  Symptoms worsened throughout the past year.  Mainly having short-term memory loss, forgetfulness, not able to manage her medications.  She ended up in the hospital with hyperglycemia.  She was referred to neurology clinic in July 2023.  Symptoms worse lately having some increased stress.  Patient's husband has dementia currently living in memory care.   REVIEW OF SYSTEMS: Full 14 system review of systems performed and negative with exception of: as per HPI.  ALLERGIES: No Known Allergies  HOME MEDICATIONS: Outpatient Medications Prior to Visit  Medication Sig Dispense Refill   acetaminophen (TYLENOL) 500 MG tablet Take 500-1,000 mg by mouth every 6 (six) hours as needed for moderate pain.     cholecalciferol (VITAMIN D) 1000 units tablet Take 1,000 Units by mouth once a week.      escitalopram (LEXAPRO) 10 MG tablet Take 10 mg by mouth daily.     fluticasone (FLONASE) 50 MCG/ACT nasal spray Place 2 sprays into the nose daily as needed for allergies.     insulin aspart (NOVOLOG) 100 UNIT/ML FlexPen Before each meal 3 times a day, 140-199 - 4 units,  200-250 - 6 units, 251-299 - 8 units,  300-349 - 10 units,  350 or above 12 units. Insulin PEN if approved, provide syringes and needles if needed. 15 mL 0   Insulin Glargine (BASAGLAR KWIKPEN) 100 UNIT/ML Inject 55 Units into the skin daily.     levothyroxine (SYNTHROID, LEVOTHROID) 75 MCG tablet Take 75 mcg by mouth daily before breakfast.     metFORMIN (GLUCOPHAGE) 1000 MG tablet Take 1,000 mg by mouth 2 (two) times daily with a meal.     pantoprazole (PROTONIX) 40 MG tablet Take 40 mg by mouth daily.     rosuvastatin (CRESTOR) 20 MG tablet Take 20 mg by mouth daily.     No facility-administered medications prior to visit.    PAST MEDICAL HISTORY: Past Medical History:  Diagnosis Date   Arthritis    OA   Complication of anesthesia    pt has been told she is hard to intubate.   Diabetes mellitus without complication ([place])    Difficult intubation    Fibromyalgia    GERD (gastroesophageal reflux disease)    Headache    HX MIGRAINES  NONE IN LONG TIME   History of hiatal hernia    Hypertension    Hypothyroidism     PAST SURGICAL HISTORY: Past Surgical History:  Procedure Laterality Date   BREAST SURGERY     bilateral mastectomy   CHOLECYSTECTOMY     DILATATION & CURETTAGE/HYSTEROSCOPY WITH TRUECLEAR N/A 09/30/2012   Procedure: DILATATION & CURETTAGE/HYSTEROSCOPY  WITH TRUECLEAR;  Surgeon: Allena Katz, MD;  Location: [facility];  Service: Gynecology;  Laterality: N/A;   DILATION AND CURETTAGE OF UTERUS     uterine polyp   KNEE SURGERY     LUMBAR LAMINECTOMY/DECOMPRESSION MICRODISCECTOMY Left 03/24/2017   Procedure: LEFT LUMBAR FIVE-SACRAL ONE MICRODISCECTOMY, LEFT LUMBAR FOUR-FIVE DECOMPRESSIVE LAMINECTOMY;  Surgeon: Kary Kos, MD;  Location: [facility];  Service: Neurosurgery;  Laterality: Left;  Left L4-5 L5-S1 Laminectomy/Foraminotomy   LUMBAR LAMINECTOMY/DECOMPRESSION MICRODISCECTOMY Left 09/01/2017   Procedure: Microdiscectomy - Lumbar Five-Sacral One - left redo;  Surgeon:  Kary Kos, MD;  Location: [facility];  Service: Neurosurgery;  Laterality: Left;  left   TONSILLECTOMY      FAMILY HISTORY: No family history on file.  SOCIAL HISTORY: Social History   Socioeconomic History   Marital status: Married    Spouse name: Not on file   Number of children: 2   Years of education: 12   Highest education level: Not on file  Occupational History   Not on file  Tobacco Use   Smoking status: Never   Smokeless tobacco: Never  Vaping Use   Vaping Use: Never used  Substance and Sexual Activity   Alcohol use: No   Drug use: No   Sexual activity: Not on file  Other Topics Concern   Not on file  Social History Narrative   10/14/21 lives alone with cat, has caregiver, Bernadette Hoit- Fri till 4 pm, son comes on Thurs afternoons, husband lives at [place] Determinants of Health   Financial Resource Strain: Not on file  Food Insecurity: Not on file  Transportation Needs: Not on file  Physical Activity: Not on file  Stress: Not on file  Social Connections: Not on file  Intimate Partner Violence: Not on file     PHYSICAL EXAM  GENERAL EXAM/CONSTITUTIONAL: Vitals:  Vitals:   02/16/22 1406  BP: 133/79  Pulse: 82  Weight: 198 lb 12.8 oz (90.2 kg)  Height: '5\' 7"'$  (1.702 m)   Body mass index is 31.14 kg/m. Wt Readings from Last 3 Encounters:  02/16/22 198 lb 12.8 oz (90.2 kg)  10/14/21 200 lb (90.7 kg)  07/29/21 230 lb (104.3 kg)   Patient is in no distress; well developed, nourished and groomed; neck is supple  CARDIOVASCULAR: Examination of carotid arteries is normal; no carotid bruits Regular rate and rhythm, no murmurs Examination of peripheral vascular system by observation and palpation is normal  EYES: Ophthalmoscopic exam of optic discs and posterior segments is normal; no papilledema or hemorrhages No results found.  MUSCULOSKELETAL: Gait, strength, tone, movements noted in Neurologic exam below  NEUROLOGIC: MENTAL STATUS:      02/16/2022    2:14 PM 10/14/2021   11:59 AM  MMSE - Mini Mental State Exam  Orientation to time 2 3  Orientation to Place 5 4  Registration 3 3  Attention/ Calculation 2 2  Recall 2 1  Language- name 2 objects 2 2  Language- repeat 1 0  Language- follow 3 step command 3 3  Language- read & follow direction 1 1  Write a sentence 1 1  Copy design 1 0  Total score 23 20   awake, alert, oriented to person, place and time recent and remote memory intact normal attention and concentration language fluent, comprehension intact, naming intact fund of knowledge appropriate  CRANIAL NERVE:  2nd - no papilledema on fundoscopic exam 2nd, 3rd, 4th, 6th - pupils equal and reactive to  light, visual fields full to confrontation, extraocular muscles intact, no nystagmus 5th - facial sensation symmetric 7th - facial strength symmetric 8th - hearing intact 9th - palate elevates symmetrically, uvula midline 11th - shoulder shrug symmetric 12th - tongue protrusion midline  MOTOR:  normal bulk and tone, full strength in the BUE, BLE  SENSORY:  normal and symmetric to light touch, temperature, vibration  COORDINATION:  finger-nose-finger, fine finger movements normal  REFLEXES:  deep tendon reflexes TRACE and symmetric  GAIT/STATION:  narrow based gait     DIAGNOSTIC DATA (LABS, IMAGING, TESTING) - I reviewed patient records, labs, notes, testing and imaging myself where available.  Lab Results  Component Value Date   WBC 5.8 08/05/2021   HGB 11.5 (L) 08/05/2021   HCT 35.7 (L) 08/05/2021   MCV 98.1 08/05/2021   PLT 209 08/05/2021      Component Value Date/Time   NA 137 08/05/2021 0639   K 4.6 08/05/2021 0639   CL 102 08/05/2021 0639   CO2 27 08/05/2021 0639   GLUCOSE 149 (H) 08/05/2021 0639   BUN 11 08/05/2021 0639   CREATININE 0.72 08/05/2021 0639   CALCIUM 9.1 08/05/2021 0639   PROT 6.0 (L) 08/01/2021 0039   ALBUMIN 2.4 (L) 08/01/2021 0039   AST 16 08/01/2021  0039   ALT 12 08/01/2021 0039   ALKPHOS 56 08/01/2021 0039   BILITOT 0.4 08/01/2021 0039   GFRNONAA >60 08/05/2021 0639   GFRAA >60 08/24/2017 1032   No results found for: "CHOL", "HDL", "LDLCALC", "LDLDIRECT", "TRIG", "CHOLHDL" Lab Results  Component Value Date   HGBA1C >15.5 (H) 07/29/2021   Lab Results  Component Value Date   VITAMINB12 456 07/29/2021   Lab Results  Component Value Date   TSH 2.073 07/29/2021    07/31/21 MRI brain - No acute intracranial abnormality and normal for age noncontrast MRI appearance of the brain.  Nov 2023 --> A1c - 6.6    ASSESSMENT AND PLAN  82 y.o. year old female here with:   Dx:  1. Mild dementia without behavioral disturbance, psychotic disturbance, mood disturbance, or anxiety, unspecified dementia type ([place])     PLAN:  MILD MEMORY LOSS (mild dementia; decline in ADLs) - safety / supervision issues reviewed - daily physical activity / exercise (at least 15-30 minutes) - eat more plants / vegetables - increase social activities, brain stimulation, games, puzzles, hobbies, crafts, arts, music - aim for at least 7-8 hours sleep per night (or more) - avoid smoking and alcohol - caregiver resources provided - caution with medications, finances; no driving - may consider memantine or lecanemab in future  Return for return to PCP.  I spent 29 minutes of face-to-face and non-face-to-face time with patient.  This included previsit chart review, lab review, study review, order entry, electronic health record documentation, patient education.     Penni Bombard, MD 0/98/1191, 4:78 PM Certified in Neurology, Neurophysiology and Neuroimaging  [facility] [address] [place], [place] [address] [phone]

## 2022-02-16 NOTE — Patient Instructions (Addendum)
MILD MEMORY LOSS (mild dementia; decline in ADLs) - safety / supervision issues reviewed - daily physical activity / exercise (at least 15-30 minutes) - eat more plants / vegetables - increase social activities, brain stimulation, games, puzzles, hobbies, crafts, arts, music - aim for at least 7-8 hours sleep per night (or more) - avoid smoking and alcohol - caregiver resources provided - caution with medications, finances; no driving - may consider memantine or lecanemab in future

## 2022-02-25 DIAGNOSIS — M5126 Other intervertebral disc displacement, lumbar region: Secondary | ICD-10-CM | POA: Diagnosis not present

## 2022-03-26 DIAGNOSIS — M5126 Other intervertebral disc displacement, lumbar region: Secondary | ICD-10-CM | POA: Diagnosis not present

## 2022-04-02 DIAGNOSIS — E1165 Type 2 diabetes mellitus with hyperglycemia: Secondary | ICD-10-CM | POA: Diagnosis not present

## 2022-04-02 DIAGNOSIS — E1169 Type 2 diabetes mellitus with other specified complication: Secondary | ICD-10-CM | POA: Diagnosis not present

## 2022-04-02 DIAGNOSIS — I10 Essential (primary) hypertension: Secondary | ICD-10-CM | POA: Diagnosis not present

## 2022-04-02 DIAGNOSIS — G629 Polyneuropathy, unspecified: Secondary | ICD-10-CM | POA: Diagnosis not present

## 2022-04-02 DIAGNOSIS — E038 Other specified hypothyroidism: Secondary | ICD-10-CM | POA: Diagnosis not present

## 2022-04-02 DIAGNOSIS — E785 Hyperlipidemia, unspecified: Secondary | ICD-10-CM | POA: Diagnosis not present

## 2022-04-02 DIAGNOSIS — Z794 Long term (current) use of insulin: Secondary | ICD-10-CM | POA: Diagnosis not present

## 2022-04-22 ENCOUNTER — Encounter: Payer: Self-pay | Admitting: Diagnostic Neuroimaging

## 2022-04-26 DIAGNOSIS — M5126 Other intervertebral disc displacement, lumbar region: Secondary | ICD-10-CM | POA: Diagnosis not present

## 2022-04-27 ENCOUNTER — Other Ambulatory Visit: Payer: Self-pay | Admitting: Neurology

## 2022-04-27 MED ORDER — MEMANTINE HCL 28 X 5 MG & 21 X 10 MG PO TABS: ORAL_TABLET | ORAL | 0 refills | Status: DC

## 2022-05-25 ENCOUNTER — Other Ambulatory Visit: Payer: Self-pay | Admitting: Diagnostic Neuroimaging

## 2022-05-26 ENCOUNTER — Other Ambulatory Visit: Payer: Self-pay

## 2022-05-26 DIAGNOSIS — M5126 Other intervertebral disc displacement, lumbar region: Secondary | ICD-10-CM | POA: Diagnosis not present

## 2022-05-26 MED ORDER — MEMANTINE HCL 5 MG PO TABS: 5.0000 mg | ORAL_TABLET | Freq: Two times a day (BID) | ORAL | 0 refills | Status: DC

## 2022-05-27 ENCOUNTER — Other Ambulatory Visit: Payer: Self-pay | Admitting: Diagnostic Neuroimaging

## 2022-05-27 MED ORDER — MEMANTINE HCL 10 MG PO TABS: 10.0000 mg | ORAL_TABLET | Freq: Two times a day (BID) | ORAL | 5 refills | Status: DC

## 2022-05-28 NOTE — Telephone Encounter (Signed)
Form completed and signed by Dr Marjory Lies. Will provide to Stanton Kidney in MR.

## 2022-06-16 ENCOUNTER — Encounter: Payer: Self-pay | Admitting: Diagnostic Neuroimaging

## 2022-06-16 ENCOUNTER — Ambulatory Visit: Payer: Medicare HMO | Admitting: Diagnostic Neuroimaging

## 2022-06-16 VITALS — BP 117/68 | HR 84 | Ht 66.0 in | Wt 205.0 lb

## 2022-06-16 DIAGNOSIS — F03A0 Unspecified dementia, mild, without behavioral disturbance, psychotic disturbance, mood disturbance, and anxiety: Secondary | ICD-10-CM

## 2022-06-16 MED ORDER — DONEPEZIL HCL 5 MG PO TABS: 5.0000 mg | ORAL_TABLET | Freq: Every day | ORAL | 0 refills | Status: DC

## 2022-06-16 MED ORDER — MEMANTINE HCL 10 MG PO TABS
10.0000 mg | ORAL_TABLET | Freq: Two times a day (BID) | ORAL | 4 refills | Status: DC
Start: 2022-06-16 — End: 2023-03-15

## 2022-06-16 NOTE — Progress Notes (Signed)
[facility]  PATIENT: Jennifer Boyer DOB: 05-20-1940  REFERRING CLINICIAN: Jackelyn Poling, DO HISTORY FROM: patient and daughter Ephriam Knuckles B) REASON FOR VISIT: follow up   HISTORICAL  CHIEF COMPLAINT:  Chief Complaint  Patient presents with   Follow-up    Pt with daughter, rm 7. Here today to discuss memory medications. She is tolerating the memantine 10 mg BId well. The daughter feels just wanted to have a check in on memory. Following up from imaging. Short term memory has more noticiable.    HISTORY OF PRESENT ILLNESS:   UPDATE (06/16/22, VRP): Since last visit, more progression of symptoms at home per daughter. Patient does not note that much of sxs. Tolerating medications. No alleviating or aggravating factors. Planning to move to [place] independent living.   UPDATE (02/16/22, VRP): Since last visit, doing about the same per patient, but daughter notes progression. Daughter moved here in Oct 2023 to help out. Planning to stay until at least May 2024.   PRIOR HPI (10/14/21): 82 year old female here for evaluation of memory loss.  Patient denies any memory problems.  She is here with caregiver today.  Daughter is present via phone.  Summer 2022 had onset of confusion and memory lapse issues.  This is progressively worsened over time.  Symptoms worsened throughout the past year.  Mainly having short-term memory loss, forgetfulness, not able to manage her medications.  She ended up in the hospital with hyperglycemia.  She was referred to neurology clinic in July 2023.  Symptoms worse lately having some increased stress.  Patient's husband has dementia currently living in memory care.   REVIEW OF SYSTEMS: Full 14 system review of systems performed and negative with exception of: as per HPI.  ALLERGIES: No Known Allergies  HOME MEDICATIONS: Outpatient Medications Prior to Visit  Medication Sig Dispense Refill   acetaminophen (TYLENOL) 500 MG tablet Take  500-1,000 mg by mouth every 6 (six) hours as needed for moderate pain.     cholecalciferol (VITAMIN D) 1000 units tablet Take 1,000 Units by mouth once a week.      escitalopram (LEXAPRO) 10 MG tablet Take 10 mg by mouth daily.     insulin aspart (NOVOLOG) 100 UNIT/ML FlexPen Before each meal 3 times a day, 140-199 - 4 units, 200-250 - 6 units, 251-299 - 8 units,  300-349 - 10 units,  350 or above 12 units. Insulin PEN if approved, provide syringes and needles if needed. 15 mL 0   Insulin Glargine (BASAGLAR KWIKPEN) 100 UNIT/ML Inject 55 Units into the skin daily. (Patient taking differently: Inject 35 Units into the skin daily.)     JARDIANCE 10 MG TABS tablet Take 10 mg by mouth daily.     levothyroxine (SYNTHROID, LEVOTHROID) 75 MCG tablet Take 75 mcg by mouth daily before breakfast.     memantine (NAMENDA) 10 MG tablet Take 1 tablet (10 mg total) by mouth 2 (two) times daily. 60 tablet 5   metFORMIN (GLUCOPHAGE) 1000 MG tablet Take 1,000 mg by mouth 2 (two) times daily with a meal.     pantoprazole (PROTONIX) 40 MG tablet Take 40 mg by mouth daily.     rosuvastatin (CRESTOR) 20 MG tablet Take 20 mg by mouth daily.     fluticasone (FLONASE) 50 MCG/ACT nasal spray Place 2 sprays into the nose daily as needed for allergies.     memantine (NAMENDA TITRATION PACK) tablet pack 5 mg/day for =1 week; 5 mg twice daily for =1 week; 15 mg/day  given in 5 mg and 10 mg separated doses for =1 week; then 10 mg twice daily 49 tablet 0   No facility-administered medications prior to visit.    PAST MEDICAL HISTORY: Past Medical History:  Diagnosis Date   Arthritis    OA   Complication of anesthesia    pt has been told she is hard to intubate.   Diabetes mellitus without complication (HCC)    Difficult intubation    Fibromyalgia    GERD (gastroesophageal reflux disease)    Headache    HX MIGRAINES  NONE IN LONG TIME   History of hiatal hernia    Hypertension    Hypothyroidism     PAST SURGICAL  HISTORY: Past Surgical History:  Procedure Laterality Date   BREAST SURGERY     bilateral mastectomy   CHOLECYSTECTOMY     DILATATION & CURETTAGE/HYSTEROSCOPY WITH TRUECLEAR N/A 09/30/2012   Procedure: DILATATION & CURETTAGE/HYSTEROSCOPY WITH TRUECLEAR;  Surgeon: Roselle Locus II, MD;  Location: [facility];  Service: Gynecology;  Laterality: N/A;   DILATION AND CURETTAGE OF UTERUS     uterine polyp   KNEE SURGERY     LUMBAR LAMINECTOMY/DECOMPRESSION MICRODISCECTOMY Left 03/24/2017   Procedure: LEFT LUMBAR FIVE-SACRAL ONE MICRODISCECTOMY, LEFT LUMBAR FOUR-FIVE DECOMPRESSIVE LAMINECTOMY;  Surgeon: Donalee Citrin, MD;  Location: [facility];  Service: Neurosurgery;  Laterality: Left;  Left L4-5 L5-S1 Laminectomy/Foraminotomy   LUMBAR LAMINECTOMY/DECOMPRESSION MICRODISCECTOMY Left 09/01/2017   Procedure: Microdiscectomy - Lumbar Five-Sacral One - left redo;  Surgeon: Donalee Citrin, MD;  Location: [facility] OR;  Service: Neurosurgery;  Laterality: Left;  left   TONSILLECTOMY      FAMILY HISTORY: No family history on file.  SOCIAL HISTORY: Social History   Socioeconomic History   Marital status: Married    Spouse name: Not on file   Number of children: 2   Years of education: 12   Highest education level: Not on file  Occupational History   Not on file  Tobacco Use   Smoking status: Never   Smokeless tobacco: Never  Vaping Use   Vaping Use: Never used  Substance and Sexual Activity   Alcohol use: No   Drug use: No   Sexual activity: Not on file  Other Topics Concern   Not on file  Social History Narrative   10/14/21 lives alone with cat, has caregiver, Altha Harm- Fri till 4 pm, son comes on Thurs afternoons, husband lives at [facility]   Social Determinants of Health   Financial Resource Strain: Not on file  Food Insecurity: Not on file  Transportation Needs: Not on file  Physical Activity: Not on file  Stress: Not on file  Social Connections: Not on file  Intimate Partner Violence: Not on  file     PHYSICAL EXAM  GENERAL EXAM/CONSTITUTIONAL: Vitals:  Vitals:   06/16/22 1353  BP: 117/68  Pulse: 84  Weight: 205 lb (93 kg)  Height: 5\' 6"  (1.676 m)   Body mass index is 33.09 kg/m. Wt Readings from Last 3 Encounters:  06/16/22 205 lb (93 kg)  02/16/22 198 lb 12.8 oz (90.2 kg)  10/14/21 200 lb (90.7 kg)   Patient is in no distress; well developed, nourished and groomed; neck is supple  CARDIOVASCULAR: Examination of carotid arteries is normal; no carotid bruits Regular rate and rhythm, no murmurs Examination of peripheral vascular system by observation and palpation is normal  EYES: Ophthalmoscopic exam of optic discs and posterior segments is normal; no papilledema or hemorrhages No results  found.  MUSCULOSKELETAL: Gait, strength, tone, movements noted in Neurologic exam below  NEUROLOGIC: MENTAL STATUS:     06/16/2022    1:55 PM 02/16/2022    2:14 PM 10/14/2021   11:59 AM  MMSE - Mini Mental State Exam  Orientation to time 2 2 3   Orientation to Place 4 5 4   Registration 3 3 3   Attention/ Calculation 5 2 2   Attention/Calculation-comments unable to do math.    Recall 1 2 1   Language- name 2 objects 2 2 2   Language- repeat 1 1 0  Language- follow 3 step command 3 3 3   Language- read & follow direction 1 1 1   Write a sentence 1 1 1   Copy design 1 1 0  Total score 24 23 20    awake, alert, oriented to person, place and time recent and remote memory intact normal attention and concentration language fluent, comprehension intact, naming intact fund of knowledge appropriate  CRANIAL NERVE:  2nd - no papilledema on fundoscopic exam 2nd, 3rd, 4th, 6th - pupils equal and reactive to light, visual fields full to confrontation, extraocular muscles intact, no nystagmus 5th - facial sensation symmetric 7th - facial strength symmetric 8th - hearing intact 9th - palate elevates symmetrically, uvula midline 11th - shoulder shrug symmetric 12th - tongue  protrusion midline  MOTOR:  normal bulk and tone, full strength in the BUE, BLE  SENSORY:  normal and symmetric to light touch, temperature, vibration  COORDINATION:  finger-nose-finger, fine finger movements normal  REFLEXES:  deep tendon reflexes TRACE and symmetric  GAIT/STATION:  narrow based gait     DIAGNOSTIC DATA (LABS, IMAGING, TESTING) - I reviewed patient records, labs, notes, testing and imaging myself where available.  Lab Results  Component Value Date   WBC 5.8 08/05/2021   HGB 11.5 (L) 08/05/2021   HCT 35.7 (L) 08/05/2021   MCV 98.1 08/05/2021   PLT 209 08/05/2021      Component Value Date/Time   NA 137 08/05/2021 0639   K 4.6 08/05/2021 0639   CL 102 08/05/2021 0639   CO2 27 08/05/2021 0639   GLUCOSE 149 (H) 08/05/2021 0639   BUN 11 08/05/2021 0639   CREATININE 0.72 08/05/2021 0639   CALCIUM 9.1 08/05/2021 0639   PROT 6.0 (L) 08/01/2021 0039   ALBUMIN 2.4 (L) 08/01/2021 0039   AST 16 08/01/2021 0039   ALT 12 08/01/2021 0039   ALKPHOS 56 08/01/2021 0039   BILITOT 0.4 08/01/2021 0039   GFRNONAA >60 08/05/2021 0639   GFRAA >60 08/24/2017 1032   No results found for: "CHOL", "HDL", "LDLCALC", "LDLDIRECT", "TRIG", "CHOLHDL" Lab Results  Component Value Date   HGBA1C >15.5 (H) 07/29/2021   Lab Results  Component Value Date   VITAMINB12 456 07/29/2021   Lab Results  Component Value Date   TSH 2.073 07/29/2021    07/31/21 MRI brain - No acute intracranial abnormality and normal for age noncontrast MRI appearance of the brain.  Nov 2023 --> A1c - 6.6    ASSESSMENT AND PLAN  82 y.o. year old female here with:   Dx:  No diagnosis found.   PLAN:  MILD MEMORY LOSS (mild dementia; mild decline in ADLs; has good support from family and caregiver aid) - check ATN panel - continue memantine - add donepezil 5mg  daily; may increase to 10mg  daily after 2-4 weeks - safety / supervision issues reviewed - daily physical activity /  exercise (at least 15-30 minutes) - eat more plants / vegetables -  increase social activities, brain stimulation, games, puzzles, hobbies, crafts, arts, music - aim for at least 7-8 hours sleep per night (or more) - caution with medications, finances; no driving  Meds ordered this encounter  Medications   donepezil (ARICEPT) 5 MG tablet    Sig: Take 1 tablet (5 mg total) by mouth at bedtime.    Dispense:  30 tablet    Refill:  0   memantine (NAMENDA) 10 MG tablet    Sig: Take 1 tablet (10 mg total) by mouth 2 (two) times daily.    Dispense:  180 tablet    Refill:  4   Orders Placed This Encounter  Procedures   ATN PROFILE   Return in about 9 months (around 03/19/2023).    Suanne Marker, MD 06/16/2022, 2:06 PM Certified in Neurology, Neurophysiology and Neuroimaging  [facility] [address] [place], [place] [address] [phone]

## 2022-06-16 NOTE — Patient Instructions (Signed)
  MILD MEMORY LOSS (mild dementia; decline in ADLs) - check ATN panel - continue memantine - add donepezil 5mg  daily; may increase to 10mg  daily after 2-4 weeks - safety / supervision issues reviewed - daily physical activity / exercise (at least 15-30 minutes) - eat more plants / vegetables - increase social activities, brain stimulation, games, puzzles, hobbies, crafts, arts, music - aim for at least 7-8 hours sleep per night (or more) - avoid smoking and alcohol - caregiver resources provided - caution with medications, finances; no driving

## 2022-06-19 LAB — ATN PROFILE
A -- Beta-amyloid 42/40 Ratio: 0.117 (ref >0.102)
Beta-amyloid 40: 243.35 pg/mL
Beta-amyloid 42: 28.43 pg/mL
N -- NfL, Plasma: 4.19 pg/mL (ref 0.00–11.55)
T -- p-tau181: 0.8 pg/mL (ref 0.00–0.97)

## 2022-07-08 ENCOUNTER — Other Ambulatory Visit: Payer: Self-pay | Admitting: Diagnostic Neuroimaging

## 2022-07-09 NOTE — Telephone Encounter (Signed)
Called the patient to confirm she is tolerating it well. She has been doing well and is ok to increase to the 10 mg dose. Informed her if she starts the 10 mg dose at bedtime and develops upset stomach, we could always decrease back down to 5 mg. She verbalized understanding and had no questions.

## 2022-07-15 ENCOUNTER — Other Ambulatory Visit: Payer: Self-pay | Admitting: Diagnostic Neuroimaging

## 2022-07-27 DIAGNOSIS — I10 Essential (primary) hypertension: Secondary | ICD-10-CM | POA: Diagnosis not present

## 2022-07-27 DIAGNOSIS — E1165 Type 2 diabetes mellitus with hyperglycemia: Secondary | ICD-10-CM | POA: Diagnosis not present

## 2022-07-27 DIAGNOSIS — Z794 Long term (current) use of insulin: Secondary | ICD-10-CM | POA: Diagnosis not present

## 2022-07-27 DIAGNOSIS — E038 Other specified hypothyroidism: Secondary | ICD-10-CM | POA: Diagnosis not present

## 2022-07-27 DIAGNOSIS — E785 Hyperlipidemia, unspecified: Secondary | ICD-10-CM | POA: Diagnosis not present

## 2022-07-27 DIAGNOSIS — E1169 Type 2 diabetes mellitus with other specified complication: Secondary | ICD-10-CM | POA: Diagnosis not present

## 2022-07-28 ENCOUNTER — Encounter: Payer: Self-pay | Admitting: Diagnostic Neuroimaging

## 2022-07-28 DIAGNOSIS — E039 Hypothyroidism, unspecified: Secondary | ICD-10-CM | POA: Diagnosis not present

## 2022-07-28 DIAGNOSIS — F0390 Unspecified dementia without behavioral disturbance: Secondary | ICD-10-CM | POA: Diagnosis not present

## 2022-07-28 DIAGNOSIS — E785 Hyperlipidemia, unspecified: Secondary | ICD-10-CM | POA: Diagnosis not present

## 2022-07-28 DIAGNOSIS — H6123 Impacted cerumen, bilateral: Secondary | ICD-10-CM | POA: Diagnosis not present

## 2022-07-28 DIAGNOSIS — Z23 Encounter for immunization: Secondary | ICD-10-CM | POA: Diagnosis not present

## 2022-07-28 DIAGNOSIS — E1165 Type 2 diabetes mellitus with hyperglycemia: Secondary | ICD-10-CM | POA: Diagnosis not present

## 2022-07-28 DIAGNOSIS — E1142 Type 2 diabetes mellitus with diabetic polyneuropathy: Secondary | ICD-10-CM | POA: Diagnosis not present

## 2022-07-28 DIAGNOSIS — E559 Vitamin D deficiency, unspecified: Secondary | ICD-10-CM | POA: Diagnosis not present

## 2022-07-28 DIAGNOSIS — Z0000 Encounter for general adult medical examination without abnormal findings: Secondary | ICD-10-CM | POA: Diagnosis not present

## 2022-07-28 DIAGNOSIS — F3341 Major depressive disorder, recurrent, in partial remission: Secondary | ICD-10-CM | POA: Diagnosis not present

## 2022-08-26 DIAGNOSIS — M5126 Other intervertebral disc displacement, lumbar region: Secondary | ICD-10-CM | POA: Diagnosis not present

## 2022-11-18 DIAGNOSIS — E1165 Type 2 diabetes mellitus with hyperglycemia: Secondary | ICD-10-CM | POA: Diagnosis not present

## 2022-11-25 DIAGNOSIS — E038 Other specified hypothyroidism: Secondary | ICD-10-CM | POA: Diagnosis not present

## 2022-11-25 DIAGNOSIS — E785 Hyperlipidemia, unspecified: Secondary | ICD-10-CM | POA: Diagnosis not present

## 2022-11-25 DIAGNOSIS — G629 Polyneuropathy, unspecified: Secondary | ICD-10-CM | POA: Diagnosis not present

## 2022-11-25 DIAGNOSIS — E1165 Type 2 diabetes mellitus with hyperglycemia: Secondary | ICD-10-CM | POA: Diagnosis not present

## 2022-11-25 DIAGNOSIS — E1169 Type 2 diabetes mellitus with other specified complication: Secondary | ICD-10-CM | POA: Diagnosis not present

## 2022-11-25 DIAGNOSIS — I10 Essential (primary) hypertension: Secondary | ICD-10-CM | POA: Diagnosis not present

## 2022-12-20 ENCOUNTER — Other Ambulatory Visit: Payer: Self-pay | Admitting: Diagnostic Neuroimaging

## 2022-12-24 ENCOUNTER — Ambulatory Visit: Payer: Medicare HMO | Admitting: Podiatry

## 2023-01-04 ENCOUNTER — Ambulatory Visit: Payer: Medicare HMO | Admitting: Podiatry

## 2023-01-19 ENCOUNTER — Other Ambulatory Visit: Payer: Self-pay | Admitting: Diagnostic Neuroimaging

## 2023-01-19 NOTE — Telephone Encounter (Signed)
Rx refilled.

## 2023-01-29 DIAGNOSIS — E039 Hypothyroidism, unspecified: Secondary | ICD-10-CM | POA: Diagnosis not present

## 2023-01-29 DIAGNOSIS — Z794 Long term (current) use of insulin: Secondary | ICD-10-CM | POA: Diagnosis not present

## 2023-01-29 DIAGNOSIS — E785 Hyperlipidemia, unspecified: Secondary | ICD-10-CM | POA: Diagnosis not present

## 2023-01-29 DIAGNOSIS — F03A0 Unspecified dementia, mild, without behavioral disturbance, psychotic disturbance, mood disturbance, and anxiety: Secondary | ICD-10-CM | POA: Diagnosis not present

## 2023-01-29 DIAGNOSIS — E119 Type 2 diabetes mellitus without complications: Secondary | ICD-10-CM | POA: Diagnosis not present

## 2023-02-19 ENCOUNTER — Other Ambulatory Visit: Payer: Self-pay | Admitting: Diagnostic Neuroimaging

## 2023-02-19 NOTE — Telephone Encounter (Signed)
Last seen on 06/16/22 Follow up scheduled 03/14/22

## 2023-03-15 ENCOUNTER — Encounter: Payer: Self-pay | Admitting: Diagnostic Neuroimaging

## 2023-03-15 ENCOUNTER — Ambulatory Visit: Payer: Medicare HMO | Admitting: Diagnostic Neuroimaging

## 2023-03-15 VITALS — BP 112/62 | HR 76 | Ht 66.5 in | Wt 214.6 lb

## 2023-03-15 DIAGNOSIS — F03A0 Unspecified dementia, mild, without behavioral disturbance, psychotic disturbance, mood disturbance, and anxiety: Secondary | ICD-10-CM | POA: Diagnosis not present

## 2023-03-15 MED ORDER — MEMANTINE HCL 10 MG PO TABS: 10.0000 mg | ORAL_TABLET | Freq: Two times a day (BID) | ORAL | 4 refills | Status: DC

## 2023-03-15 MED ORDER — DONEPEZIL HCL 10 MG PO TABS: 10.0000 mg | ORAL_TABLET | Freq: Every day | ORAL | 4 refills | Status: AC

## 2023-03-15 NOTE — Progress Notes (Signed)
 [facility]  PATIENT: Jennifer Boyer DOB: 11-18-1940  REFERRING CLINICIAN: Jackelyn Poling, DO HISTORY FROM: patient and daughter Ephriam Knuckles B) REASON FOR VISIT: follow up   HISTORICAL  CHIEF COMPLAINT:  Chief Complaint  Patient presents with   Follow-up    Pt in room 6. Daughter in room. Here for memory follow up. Daughter said pt forgets to take medications and memory has declined. Pt lives at [place] independent living.    HISTORY OF PRESENT ILLNESS:   UPDATE (03/15/23, VRP): Since last visit, some progression of symptoms (per daughter; patient feels about the same). Tolerating meds. Now in [place] independent living.   UPDATE (06/16/22, VRP): Since last visit, more progression of symptoms at home per daughter. Patient does not note that much of sxs. Tolerating medications. No alleviating or aggravating factors. Planning to move to [place] independent living.   UPDATE (02/16/22, VRP): Since last visit, doing about the same per patient, but daughter notes progression. Daughter moved here in Oct 2023 to help out. Planning to stay until at least May 2024.   PRIOR HPI (10/14/21): 83 year old female here for evaluation of memory loss.  Patient denies any memory problems.  She is here with caregiver today.  Daughter is present via phone.  Summer 2022 had onset of confusion and memory lapse issues.  This is progressively worsened over time.  Symptoms worsened throughout the past year.  Mainly having short-term memory loss, forgetfulness, not able to manage her medications.  She ended up in the hospital with hyperglycemia.  She was referred to neurology clinic in July 2023.  Symptoms worse lately having some increased stress.  Patient's husband has dementia currently living in memory care.   REVIEW OF SYSTEMS: Full 14 system review of systems performed and negative with exception of: as per HPI.  ALLERGIES: No Known Allergies  HOME MEDICATIONS: Outpatient Medications  Prior to Visit  Medication Sig Dispense Refill   acetaminophen (TYLENOL) 500 MG tablet Take 500-1,000 mg by mouth every 6 (six) hours as needed for moderate pain.     cholecalciferol (VITAMIN D) 1000 units tablet Take 1,000 Units by mouth once a week.      escitalopram (LEXAPRO) 10 MG tablet Take 10 mg by mouth daily.     insulin aspart (NOVOLOG) 100 UNIT/ML FlexPen Before each meal 3 times a day, 140-199 - 4 units, 200-250 - 6 units, 251-299 - 8 units,  300-349 - 10 units,  350 or above 12 units. Insulin PEN if approved, provide syringes and needles if needed. 15 mL 0   Insulin Glargine (BASAGLAR KWIKPEN) 100 UNIT/ML Inject 55 Units into the skin daily. (Patient taking differently: Inject 35 Units into the skin daily.)     JARDIANCE 10 MG TABS tablet Take 10 mg by mouth daily.     levothyroxine (SYNTHROID, LEVOTHROID) 75 MCG tablet Take 75 mcg by mouth daily before breakfast.     metFORMIN (GLUCOPHAGE) 1000 MG tablet Take 1,000 mg by mouth 2 (two) times daily with a meal.     pantoprazole (PROTONIX) 40 MG tablet Take 40 mg by mouth daily.     rosuvastatin (CRESTOR) 20 MG tablet Take 20 mg by mouth daily.     donepezil (ARICEPT) 10 MG tablet TAKE ONE TABLET BY MOUTH DAILY AT BEDTIME 30 tablet 0   memantine (NAMENDA) 10 MG tablet Take 1 tablet (10 mg total) by mouth 2 (two) times daily. 180 tablet 4   No facility-administered medications prior to visit.    PAST  MEDICAL HISTORY: Past Medical History:  Diagnosis Date   Arthritis    OA   Complication of anesthesia    pt has been told she is hard to intubate.   Diabetes mellitus without complication (HCC)    Difficult intubation    Fibromyalgia    GERD (gastroesophageal reflux disease)    Headache    HX MIGRAINES  NONE IN LONG TIME   History of hiatal hernia    Hypertension    Hypothyroidism     PAST SURGICAL HISTORY: Past Surgical History:  Procedure Laterality Date   BREAST SURGERY     bilateral mastectomy   CHOLECYSTECTOMY      DILATATION & CURETTAGE/HYSTEROSCOPY WITH TRUECLEAR N/A 09/30/2012   Procedure: DILATATION & CURETTAGE/HYSTEROSCOPY WITH TRUECLEAR;  Surgeon: Roselle Locus II, MD;  Location: [facility];  Service: Gynecology;  Laterality: N/A;   DILATION AND CURETTAGE OF UTERUS     uterine polyp   KNEE SURGERY     LUMBAR LAMINECTOMY/DECOMPRESSION MICRODISCECTOMY Left 03/24/2017   Procedure: LEFT LUMBAR FIVE-SACRAL ONE MICRODISCECTOMY, LEFT LUMBAR FOUR-FIVE DECOMPRESSIVE LAMINECTOMY;  Surgeon: Donalee Citrin, MD;  Location: [facility] OR;  Service: Neurosurgery;  Laterality: Left;  Left L4-5 L5-S1 Laminectomy/Foraminotomy   LUMBAR LAMINECTOMY/DECOMPRESSION MICRODISCECTOMY Left 09/01/2017   Procedure: Microdiscectomy - Lumbar Five-Sacral One - left redo;  Surgeon: Donalee Citrin, MD;  Location: [facility] OR;  Service: Neurosurgery;  Laterality: Left;  left   TONSILLECTOMY      FAMILY HISTORY: History reviewed. No pertinent family history.  SOCIAL HISTORY: Social History   Socioeconomic History   Marital status: Married    Spouse name: Not on file   Number of children: 2   Years of education: 12   Highest education level: Not on file  Occupational History   Not on file  Tobacco Use   Smoking status: Never   Smokeless tobacco: Never  Vaping Use   Vaping status: Never Used  Substance and Sexual Activity   Alcohol use: No   Drug use: No   Sexual activity: Not on file  Other Topics Concern   Not on file  Social History Narrative   Pt lives at [place] independent living in [place].       Social Drivers of Corporate investment banker Strain: Not on file  Food Insecurity: Not on file  Transportation Needs: Not on file  Physical Activity: Not on file  Stress: Not on file  Social Connections: Not on file  Intimate Partner Violence: Not on file     PHYSICAL EXAM  GENERAL EXAM/CONSTITUTIONAL: Vitals:  Vitals:   03/15/23 1341  BP: 112/62  Pulse: 76  Weight: 214 lb 9.6 oz (97.3 kg)  Height: 5' 6.5" (1.689 m)    Body mass index is 34.12 kg/m. Wt Readings from Last 3 Encounters:  03/15/23 214 lb 9.6 oz (97.3 kg)  06/16/22 205 lb (93 kg)  02/16/22 198 lb 12.8 oz (90.2 kg)   Patient is in no distress; well developed, nourished and groomed; neck is supple  CARDIOVASCULAR: Examination of carotid arteries is normal; no carotid bruits Regular rate and rhythm, no murmurs Examination of peripheral vascular system by observation and palpation is normal  EYES: Ophthalmoscopic exam of optic discs and posterior segments is normal; no papilledema or hemorrhages No results found.  MUSCULOSKELETAL: Gait, strength, tone, movements noted in Neurologic exam below  NEUROLOGIC: MENTAL STATUS:     03/15/2023    1:47 PM 06/16/2022    1:55 PM 02/16/2022    2:14 PM  MMSE - Mini Mental State Exam  Orientation to time 1 2 2   Orientation to Place 5 4 5   Registration 3 3 3   Attention/ Calculation 0 5 2  Attention/Calculation-comments  unable to do math.   Recall 2 1 2   Language- name 2 objects 2 2 2   Language- repeat 1 1 1   Language- follow 3 step command 3 3 3   Language- read & follow direction 1 1 1   Write a sentence 1 1 1   Copy design 0 1 1  Total score 19 24 23        No data to display          awake, alert, oriented to person, place and time recent and remote memory intact normal attention and concentration language fluent, comprehension intact, naming intact fund of knowledge appropriate  CRANIAL NERVE:  2nd - no papilledema on fundoscopic exam 2nd, 3rd, 4th, 6th - pupils equal and reactive to light, visual fields full to confrontation, extraocular muscles intact, no nystagmus 5th - facial sensation symmetric 7th - facial strength symmetric 8th - hearing intact 9th - palate elevates symmetrically, uvula midline 11th - shoulder shrug symmetric 12th - tongue protrusion midline  MOTOR:  normal bulk and tone, full strength in the BUE, BLE  SENSORY:  normal and symmetric to light  touch, temperature, vibration  COORDINATION:  finger-nose-finger, fine finger movements normal  REFLEXES:  deep tendon reflexes TRACE and symmetric  GAIT/STATION:  narrow based gait     DIAGNOSTIC DATA (LABS, IMAGING, TESTING) - I reviewed patient records, labs, notes, testing and imaging myself where available.  Lab Results  Component Value Date   WBC 5.8 08/05/2021   HGB 11.5 (L) 08/05/2021   HCT 35.7 (L) 08/05/2021   MCV 98.1 08/05/2021   PLT 209 08/05/2021      Component Value Date/Time   NA 137 08/05/2021 0639   K 4.6 08/05/2021 0639   CL 102 08/05/2021 0639   CO2 27 08/05/2021 0639   GLUCOSE 149 (H) 08/05/2021 0639   BUN 11 08/05/2021 0639   CREATININE 0.72 08/05/2021 0639   CALCIUM 9.1 08/05/2021 0639   PROT 6.0 (L) 08/01/2021 0039   ALBUMIN 2.4 (L) 08/01/2021 0039   AST 16 08/01/2021 0039   ALT 12 08/01/2021 0039   ALKPHOS 56 08/01/2021 0039   BILITOT 0.4 08/01/2021 0039   GFRNONAA >60 08/05/2021 0639   GFRAA >60 08/24/2017 1032   No results found for: "CHOL", "HDL", "LDLCALC", "LDLDIRECT", "TRIG", "CHOLHDL" Lab Results  Component Value Date   HGBA1C >15.5 (H) 07/29/2021   Lab Results  Component Value Date   VITAMINB12 456 07/29/2021   Lab Results  Component Value Date   TSH 2.073 07/29/2021   06/16/22  Component Ref Range & Units (hover) 9 mo ago  A -- Beta-amyloid 42/40 Ratio 0.117  Beta-amyloid 42 28.43  Beta-amyloid 40 243.35  T -- p-[id number] 0.80  N -- NfL, Plasma 4.19  ATN SUMMARY Comment  Comment:                        A- T- N-   07/31/21 MRI brain - No acute intracranial abnormality and normal for age noncontrast MRI appearance of the brain.  Nov 2023 --> A1c - 6.6    ASSESSMENT AND PLAN  83 y.o. year old female here with:   Dx:  No diagnosis found.   PLAN:  MILD MEMORY LOSS (mild dementia; mild decline in ADLs; has good support  from family and caregiver aid) - continue memantine 10mg  twice a day  - continue  donepezil 5mg  daily; may increase to 10mg  daily after 2-4 weeks - safety / supervision issues reviewed - daily physical activity / exercise (at least 15-30 minutes) - eat more plants / vegetables - increase social activities, brain stimulation, games, puzzles, hobbies, crafts, arts, music - aim for at least 7-8 hours sleep per night (or more) - caution with medications, finances; no driving  Meds ordered this encounter  Medications   donepezil (ARICEPT) 10 MG tablet    Sig: Take 1 tablet (10 mg total) by mouth at bedtime.    Dispense:  90 tablet    Refill:  4   memantine (NAMENDA) 10 MG tablet    Sig: Take 1 tablet (10 mg total) by mouth 2 (two) times daily.    Dispense:  180 tablet    Refill:  4   Return for return to PCP, pending if symptoms worsen or fail to improve.    Suanne Marker, MD 03/15/2023, 2:13 PM Certified in Neurology, Neurophysiology and Neuroimaging  [facility] [address], [address] [place], [place] [address] [phone]

## 2023-05-25 DIAGNOSIS — E1165 Type 2 diabetes mellitus with hyperglycemia: Secondary | ICD-10-CM | POA: Diagnosis not present

## 2023-05-25 DIAGNOSIS — E038 Other specified hypothyroidism: Secondary | ICD-10-CM | POA: Diagnosis not present

## 2023-06-01 DIAGNOSIS — E1169 Type 2 diabetes mellitus with other specified complication: Secondary | ICD-10-CM | POA: Diagnosis not present

## 2023-06-01 DIAGNOSIS — E038 Other specified hypothyroidism: Secondary | ICD-10-CM | POA: Diagnosis not present

## 2023-06-01 DIAGNOSIS — E1165 Type 2 diabetes mellitus with hyperglycemia: Secondary | ICD-10-CM | POA: Diagnosis not present

## 2023-06-01 DIAGNOSIS — E785 Hyperlipidemia, unspecified: Secondary | ICD-10-CM | POA: Diagnosis not present

## 2023-08-12 DIAGNOSIS — Z6834 Body mass index (BMI) 34.0-34.9, adult: Secondary | ICD-10-CM | POA: Diagnosis not present

## 2023-08-12 DIAGNOSIS — L602 Onychogryphosis: Secondary | ICD-10-CM | POA: Diagnosis not present

## 2023-08-12 DIAGNOSIS — G8929 Other chronic pain: Secondary | ICD-10-CM | POA: Diagnosis not present

## 2023-08-12 DIAGNOSIS — M545 Low back pain, unspecified: Secondary | ICD-10-CM | POA: Diagnosis not present

## 2023-08-12 DIAGNOSIS — Z23 Encounter for immunization: Secondary | ICD-10-CM | POA: Diagnosis not present

## 2023-08-12 DIAGNOSIS — I10 Essential (primary) hypertension: Secondary | ICD-10-CM | POA: Diagnosis not present

## 2023-08-12 DIAGNOSIS — F03A0 Unspecified dementia, mild, without behavioral disturbance, psychotic disturbance, mood disturbance, and anxiety: Secondary | ICD-10-CM | POA: Diagnosis not present

## 2023-08-12 DIAGNOSIS — F3341 Major depressive disorder, recurrent, in partial remission: Secondary | ICD-10-CM | POA: Diagnosis not present

## 2023-08-12 DIAGNOSIS — Z0000 Encounter for general adult medical examination without abnormal findings: Secondary | ICD-10-CM | POA: Diagnosis not present

## 2023-08-13 ENCOUNTER — Telehealth: Payer: Self-pay | Admitting: Diagnostic Neuroimaging

## 2023-08-13 NOTE — Telephone Encounter (Signed)
 Pt daughter called to request for Pt [employer] form be filled out for Pt insurance to continue to pay for medication and visit . SABRA Daughter states she can pick up forms or they can be faxed The fax number is on paperwork

## 2023-08-16 DIAGNOSIS — Z0289 Encounter for other administrative examinations: Secondary | ICD-10-CM

## 2023-08-18 NOTE — Telephone Encounter (Signed)
 Pt's daughter has called to check on status of paperwork being completed, daughter was informed 10-14 bus days are allowed for forms, she asked for MR to see if there were any updates on the paperwork being completed since this is urgent

## 2023-08-19 ENCOUNTER — Telehealth: Payer: Self-pay | Admitting: *Deleted

## 2023-08-19 NOTE — Telephone Encounter (Signed)
 Form provided to MR to submit for the pt

## 2023-08-19 NOTE — Telephone Encounter (Signed)
 Error

## 2023-08-19 NOTE — Telephone Encounter (Signed)
 Paperwork completed and placed on Dr [employer] desk to sign and then will provide to Adrien in MR.

## 2023-08-19 NOTE — Telephone Encounter (Signed)
 Pt protective form faxed on 08/19/2023

## 2023-08-24 ENCOUNTER — Ambulatory Visit: Admitting: Podiatry

## 2023-08-31 ENCOUNTER — Ambulatory Visit: Admitting: Podiatry

## 2023-09-02 ENCOUNTER — Ambulatory Visit: Admitting: Podiatry

## 2023-09-02 ENCOUNTER — Encounter: Payer: Self-pay | Admitting: Podiatry

## 2023-09-02 DIAGNOSIS — M79671 Pain in right foot: Secondary | ICD-10-CM | POA: Diagnosis not present

## 2023-09-02 DIAGNOSIS — B351 Tinea unguium: Secondary | ICD-10-CM | POA: Diagnosis not present

## 2023-09-02 DIAGNOSIS — L600 Ingrowing nail: Secondary | ICD-10-CM

## 2023-09-02 DIAGNOSIS — M79672 Pain in left foot: Secondary | ICD-10-CM

## 2023-09-02 NOTE — Progress Notes (Signed)
 Patient presents for evaluation and treatment of tenderness and some redness around nails feet.  Tenderness around toes with walking and wearing shoes.  Nails have been getting thick and tender.  Physical exam:  General appearance: Alert, pleasant, and in no acute distress.  Vascular: Pedal pulses: DP 2/4 B/L, PT 1/4 B/L.  Mild to moderate edema lower legs bilaterally.  Capillary refill time immediate bilateral  Neurological:  Light touch intact bilaterally.  Normal vibratory sensation bilaterally.  Slightly diminished Achilles tendon reflex bilaterally.  Dermatologic:  Nails thickened, disfigured, discolored 1-5 BL with subungual debris.  Redness and hypertrophic nail folds along nail folds bilaterally but no signs of drainage or infection.  Skin somewhat thin and atrophic no hair growth lower extremity.  Musculoskeletal:  Hammertoes 2 through 5 bilaterally.  Normal muscle strength lower extremity bilaterally   Diagnosis: 1. Painful onychomycotic nails 1 through 5 bilaterally. 2. Pain toes 1 through 5 bilaterally. 3.  Ingrown nails bilaterally  Plan: -New office visit level 3 for evaluation and management.  Modifier 25. -Discussed other periodic debridement of the nails and examination of the foot to check for any problems that be developing with the diabetic foot.  Discussed with her things to look out for as far as potential infections or ulcers developing. -Discussed that if some of the nails get ingrown more severely or get infected matrixectomy's may need to be -Debrided onychomycotic nails 1 through 5 bilaterally.  Return 3 months [facility]

## 2023-09-10 DIAGNOSIS — M5451 Vertebrogenic low back pain: Secondary | ICD-10-CM | POA: Diagnosis not present

## 2023-09-30 DIAGNOSIS — M5451 Vertebrogenic low back pain: Secondary | ICD-10-CM | POA: Diagnosis not present

## 2023-10-07 DIAGNOSIS — M5451 Vertebrogenic low back pain: Secondary | ICD-10-CM | POA: Diagnosis not present

## 2023-10-14 ENCOUNTER — Emergency Department (HOSPITAL_BASED_OUTPATIENT_CLINIC_OR_DEPARTMENT_OTHER)
Admission: EM | Admit: 2023-10-14 | Discharge: 2023-10-14 | Disposition: A | Discharge status: Home / Self Care | Attending: Emergency Medicine | Admitting: Emergency Medicine

## 2023-10-14 ENCOUNTER — Encounter (HOSPITAL_COMMUNITY): Payer: Self-pay | Admitting: Emergency Medicine

## 2023-10-14 ENCOUNTER — Emergency Department (HOSPITAL_BASED_OUTPATIENT_CLINIC_OR_DEPARTMENT_OTHER)

## 2023-10-14 ENCOUNTER — Other Ambulatory Visit: Payer: Self-pay

## 2023-10-14 ENCOUNTER — Ambulatory Visit (HOSPITAL_COMMUNITY)
Admission: EM | Admit: 2023-10-14 | Discharge: 2023-10-14 | Disposition: A | Discharge status: Home / Self Care | Source: Home / Self Care | Attending: Family Medicine | Admitting: Family Medicine

## 2023-10-14 ENCOUNTER — Ambulatory Visit (INDEPENDENT_AMBULATORY_CARE_PROVIDER_SITE_OTHER)

## 2023-10-14 DIAGNOSIS — M533 Sacrococcygeal disorders, not elsewhere classified: Secondary | ICD-10-CM

## 2023-10-14 DIAGNOSIS — M545 Low back pain, unspecified: Secondary | ICD-10-CM | POA: Insufficient documentation

## 2023-10-14 DIAGNOSIS — M47816 Spondylosis without myelopathy or radiculopathy, lumbar region: Secondary | ICD-10-CM | POA: Diagnosis not present

## 2023-10-14 DIAGNOSIS — M5127 Other intervertebral disc displacement, lumbosacral region: Secondary | ICD-10-CM | POA: Diagnosis not present

## 2023-10-14 DIAGNOSIS — M5459 Other low back pain: Secondary | ICD-10-CM | POA: Diagnosis not present

## 2023-10-14 DIAGNOSIS — M48061 Spinal stenosis, lumbar region without neurogenic claudication: Secondary | ICD-10-CM | POA: Diagnosis not present

## 2023-10-14 DIAGNOSIS — K5730 Diverticulosis of large intestine without perforation or abscess without bleeding: Secondary | ICD-10-CM | POA: Diagnosis not present

## 2023-10-14 DIAGNOSIS — M47814 Spondylosis without myelopathy or radiculopathy, thoracic region: Secondary | ICD-10-CM | POA: Diagnosis not present

## 2023-10-14 DIAGNOSIS — K760 Fatty (change of) liver, not elsewhere classified: Secondary | ICD-10-CM | POA: Insufficient documentation

## 2023-10-14 DIAGNOSIS — K429 Umbilical hernia without obstruction or gangrene: Secondary | ICD-10-CM | POA: Diagnosis not present

## 2023-10-14 DIAGNOSIS — K449 Diaphragmatic hernia without obstruction or gangrene: Secondary | ICD-10-CM | POA: Diagnosis not present

## 2023-10-14 DIAGNOSIS — M549 Dorsalgia, unspecified: Secondary | ICD-10-CM | POA: Diagnosis present

## 2023-10-14 LAB — COMPREHENSIVE METABOLIC PANEL WITH GFR
ALT: 9 U/L (ref 0–44)
AST: 21 U/L (ref 15–41)
Albumin: 4.2 g/dL (ref 3.5–5.0)
Alkaline Phosphatase: 72 U/L (ref 38–126)
Anion gap: 14 (ref 5–15)
BUN: 13 mg/dL (ref 8–23)
CO2: 21 mmol/L — ABNORMAL LOW (ref 22–32)
Calcium: 9.6 mg/dL (ref 8.9–10.3)
Chloride: 101 mmol/L (ref 98–111)
Creatinine, Ser: 1 mg/dL (ref 0.44–1.00)
GFR, Estimated: 56 mL/min — ABNORMAL LOW (ref >60)
Glucose, Bld: 218 mg/dL — ABNORMAL HIGH (ref 70–99)
Potassium: 4.3 mmol/L (ref 3.5–5.1)
Sodium: 136 mmol/L (ref 135–145)
Total Bilirubin: 0.5 mg/dL (ref 0.0–1.2)
Total Protein: 8.4 g/dL — ABNORMAL HIGH (ref 6.5–8.1)

## 2023-10-14 LAB — POCT URINALYSIS DIP (MANUAL ENTRY)
Bilirubin, UA: NEGATIVE
Blood, UA: NEGATIVE
Glucose, UA: 500 mg/dL — AB
Leukocytes, UA: NEGATIVE
Nitrite, UA: POSITIVE — AB
Protein Ur, POC: 30 mg/dL — AB
Spec Grav, UA: 1.01 (ref 1.010–1.025)
Urobilinogen, UA: 0.2 U/dL
pH, UA: 5 (ref 5.0–8.0)

## 2023-10-14 LAB — CBC WITH DIFFERENTIAL/PLATELET
Abs Immature Granulocytes: 0.02 K/uL (ref 0.00–0.07)
Basophils Absolute: 0 K/uL (ref 0.0–0.1)
Basophils Relative: 0 %
Eosinophils Absolute: 0 K/uL (ref 0.0–0.5)
Eosinophils Relative: 1 %
HCT: 30.7 % — ABNORMAL LOW (ref 36.0–46.0)
Hemoglobin: 9.6 g/dL — ABNORMAL LOW (ref 12.0–15.0)
Immature Granulocytes: 0 %
Lymphocytes Relative: 39 %
Lymphs Abs: 2.1 K/uL (ref 0.7–4.0)
MCH: 30.8 pg (ref 26.0–34.0)
MCHC: 31.3 g/dL (ref 30.0–36.0)
MCV: 98.4 fL (ref 80.0–100.0)
Monocytes Absolute: 0.4 K/uL (ref 0.1–1.0)
Monocytes Relative: 8 %
Neutro Abs: 2.8 K/uL (ref 1.7–7.7)
Neutrophils Relative %: 52 %
Platelets: 205 K/uL (ref 150–400)
RBC: 3.12 MIL/uL — ABNORMAL LOW (ref 3.87–5.11)
RDW: 16.2 % — ABNORMAL HIGH (ref 11.5–15.5)
WBC: 5.4 K/uL (ref 4.0–10.5)
nRBC: 0 % (ref 0.0–0.2)

## 2023-10-14 MED ORDER — METHYLPREDNISOLONE 4 MG PO TBPK: ORAL_TABLET | ORAL | 0 refills | Status: DC

## 2023-10-14 MED ORDER — OXYCODONE-ACETAMINOPHEN 5-325 MG PO TABS
1.0000 | ORAL_TABLET | Freq: Once | ORAL | Status: AC
  Administered 2023-10-14: 1 via ORAL
  Filled 2023-10-14: qty 1

## 2023-10-14 MED ORDER — OXYCODONE-ACETAMINOPHEN 5-325 MG PO TABS: 1.0000 | ORAL_TABLET | Freq: Four times a day (QID) | ORAL | 0 refills | Status: DC | PRN

## 2023-10-14 NOTE — Discharge Instructions (Signed)
 You were seen today for low back/sacral pain.  Your xray appears normal.  Your urine does not show overt infection.  Due to the severity of your pain without an injury, I recommend you go to the ER for further evaluation of your pain.

## 2023-10-14 NOTE — ED Triage Notes (Signed)
 Pt POV reporting lower back pain past week, seen at UC, urine neg, advised to come to ED for further evaluation.

## 2023-10-14 NOTE — ED Provider Notes (Signed)
 [facility]    CSN: [id number] Arrival date & time: 10/14/23  1234      History   Chief Complaint Chief Complaint  Patient presents with   Back Pain    HPI Jennifer Boyer is a 83 y.o. female.    Back Pain  Patient is here for low back pain.  She does have chronic back pain, but the pain worsened the last week.  She has taken advil, as well a expired pain medication (hydrocodone).  She has been using a walker, which she normally does not do.  Having pain even with sitting.  She does not think it is worse with standing/walking.  No known injury, no cause for pain as far as she knows.  No pain into her buttocks or legs.  No urinary symptoms.  No fevers, chills.   She has had some increased of wet bed in the morning.    She does have known kidney disease.  Stage 3 kidney disease as of may 2025.    {The patient has not been seen in Urgent Care in the last 3 years. :1}    Past Medical History:  Diagnosis Date   Arthritis    OA   Complication of anesthesia    pt has been told she is hard to intubate.   Diabetes mellitus without complication (HCC)    Difficult intubation    Fibromyalgia    GERD (gastroesophageal reflux disease)    Headache    HX MIGRAINES  NONE IN LONG TIME   History of hiatal hernia    Hypertension    Hypothyroidism     Patient Active Problem List   Diagnosis Date Noted   DKA, type 2 (HCC) 07/29/2021   Macrocytic anemia 07/29/2021   Hyperkalemia 07/29/2021   AKI (acute kidney injury) 07/29/2021   HTN (hypertension) 07/29/2021   Hypothyroidism 07/29/2021   HLD (hyperlipidemia) 07/29/2021   HNP (herniated nucleus pulposus), lumbar 03/24/2017    Past Surgical History:  Procedure Laterality Date   BREAST SURGERY     bilateral mastectomy   CHOLECYSTECTOMY     DILATATION & CURETTAGE/HYSTEROSCOPY WITH TRUECLEAR N/A 09/30/2012   Procedure: DILATATION & CURETTAGE/HYSTEROSCOPY WITH TRUECLEAR;  Surgeon: Lynwood FORBES Clubs II, MD;   Location: [facility];  Service: Gynecology;  Laterality: N/A;   DILATION AND CURETTAGE OF UTERUS     uterine polyp   KNEE SURGERY     LUMBAR LAMINECTOMY/DECOMPRESSION MICRODISCECTOMY Left 03/24/2017   Procedure: LEFT LUMBAR FIVE-SACRAL ONE MICRODISCECTOMY, LEFT LUMBAR FOUR-FIVE DECOMPRESSIVE LAMINECTOMY;  Surgeon: Onetha Kuba, MD;  Location: [facility];  Service: Neurosurgery;  Laterality: Left;  Left L4-5 L5-S1 Laminectomy/Foraminotomy   LUMBAR LAMINECTOMY/DECOMPRESSION MICRODISCECTOMY Left 09/01/2017   Procedure: Microdiscectomy - Lumbar Five-Sacral One - left redo;  Surgeon: Onetha Kuba, MD;  Location: [facility];  Service: Neurosurgery;  Laterality: Left;  left   TONSILLECTOMY      OB History     Gravida  2   Para  2   Term      Preterm      AB      Living         SAB      IAB      Ectopic      Multiple      Live Births               Home Medications    Prior to Admission medications   Medication Sig Start Date End Date Taking? Authorizing Provider  acetaminophen  (TYLENOL ) 500 MG tablet Take 500-1,000 mg by mouth every 6 (six) hours as needed for moderate pain.    [provider]  cholecalciferol  (VITAMIN D ) 1000 units tablet Take 1,000 Units by mouth once a week.     [provider]  donepezil  (ARICEPT ) 10 MG tablet Take 1 tablet (10 mg total) by mouth at bedtime. 03/15/23   Penumalli, Vikram R, MD  escitalopram (LEXAPRO) 10 MG tablet Take 10 mg by mouth daily. 09/26/21   [provider]  insulin  aspart (NOVOLOG ) 100 UNIT/ML FlexPen Before each meal 3 times a day, 140-199 - 4 units, 200-250 - 6 units, 251-299 - 8 units,  300-349 - 10 units,  350 or above 12 units. Insulin  PEN if approved, provide syringes and needles if needed. 08/05/21   Singh, Prashant K, MD  Insulin  Glargine (BASAGLAR  KWIKPEN) 100 UNIT/ML Inject 55 Units into the skin daily. Patient taking differently: Inject 35 Units into the skin daily. 08/05/21   Singh, Prashant K, MD  JARDIANCE 10  MG TABS tablet Take 10 mg by mouth daily. 05/21/22   [provider]  levothyroxine  (SYNTHROID , LEVOTHROID) 75 MCG tablet Take 75 mcg by mouth daily before breakfast.    [provider]  memantine  (NAMENDA ) 10 MG tablet Take 1 tablet (10 mg total) by mouth 2 (two) times daily. 03/15/23   Penumalli, Vikram R, MD  metFORMIN  (GLUCOPHAGE ) 1000 MG tablet Take 1,000 mg by mouth 2 (two) times daily with a meal.    [provider]  pantoprazole  (PROTONIX ) 40 MG tablet Take 40 mg by mouth daily.    [provider]  rosuvastatin  (CRESTOR ) 20 MG tablet Take 20 mg by mouth daily.    [provider]    Family History History reviewed. No pertinent family history.  Social History Social History   Tobacco Use   Smoking status: Never   Smokeless tobacco: Never  Vaping Use   Vaping status: Never Used  Substance Use Topics   Alcohol use: No   Drug use: No     Allergies   Patient has no known allergies.   Review of Systems Review of Systems  Constitutional: Negative.   HENT: Negative.    Respiratory: Negative.    Cardiovascular: Negative.   Gastrointestinal: Negative.   Genitourinary: Negative.   Musculoskeletal:  Positive for back pain.  Psychiatric/Behavioral: Negative.       Physical Exam Triage Vital Signs ED Triage Vitals [10/14/23 1302]  Encounter Vitals Group     BP (!) 116/57     Girls Systolic BP Percentile      Girls Diastolic BP Percentile      Boys Systolic BP Percentile      Boys Diastolic BP Percentile      Pulse Rate 76     Resp 18     Temp 98.3 F (36.8 C)     Temp Source Oral     SpO2 99 %     Weight      Height      Head Circumference      Peak Flow      Pain Score 8     Pain Loc      Pain Education      Exclude from Growth Chart    No data found.  Updated Vital Signs BP (!) 116/57 (BP Location: Right Arm)   Pulse 76   Temp 98.3 F (36.8 C) (Oral)   Resp 18   SpO2 99%  Visual Acuity Right Eye  Distance:   Left Eye Distance:   Bilateral Distance:    Right Eye Near:   Left Eye Near:    Bilateral Near:     Physical Exam Constitutional:      Appearance: Normal appearance. She is normal weight.  Cardiovascular:     Rate and Rhythm: Normal rate and regular rhythm.  Pulmonary:     Effort: Pulmonary effort is normal.     Breath sounds: Normal breath sounds.  Abdominal:     Palpations: Abdomen is soft.  Musculoskeletal:     Comments: TTP to the sacral area;  no TTP to the lumbar spine;   Skin:    Comments: No sores/lesions or skin breakdown to the sacral area;   Neurological:     Mental Status: She is alert.      UC Treatments / Results  Labs (all labs ordered are listed, but only abnormal results are displayed) Labs Reviewed  POCT URINALYSIS DIP (MANUAL ENTRY) - Abnormal; Notable for the following components:      Result Value   Clarity, UA cloudy (*)    Glucose, UA =500 (*)    Ketones, POC UA trace (5) (*)    Protein Ur, POC =30 (*)    Nitrite, UA Positive (*)    All other components within normal limits    EKG   Radiology No results found.  Procedures Procedures (including critical care time)  Medications Ordered in UC Medications - No data to display  Initial Impression / Assessment and Plan / UC Course  I have reviewed the triage vital signs and the nursing notes.  Pertinent labs & imaging results that were available during my care of the patient were reviewed by me and considered in my medical decision making (see chart for details).   Final Clinical Impressions(s) / UC Diagnoses   Final diagnoses:  None   Discharge Instructions   None    ED Prescriptions   None    PDMP not reviewed this encounter.

## 2023-10-14 NOTE — Discharge Instructions (Addendum)
 While you are in the emergency room, you had blood work done that was overall normal for you.  You had CT scans done that did not show any acute problem.  Please follow-up with your primary care doctor within 1 week.  Have also included the information of a spine doctor.  You may call them if your symptoms persist.  You may take the steroid Dosepak that was sent to you as instructed by the dose packaging.  You may also take as needed Percocet.  Be careful that this medication does contain Tylenol .  Return to the emergency room if you develop inability to walk, inability to control your bowels or bladder, or numbness or tingling in your legs.

## 2023-10-14 NOTE — ED Notes (Signed)
 Patient is being discharged from the Urgent Care and sent to the Emergency Department via private vehicle . Per Dr. Darral, patient is in need of higher level of care due to severe low back pain without an injury. Patient is aware and verbalizes understanding of plan of care.  Vitals:   10/14/23 1302  BP: (!) 116/57  Pulse: 76  Resp: 18  Temp: 98.3 F (36.8 C)  SpO2: 99%

## 2023-10-14 NOTE — ED Provider Notes (Signed)
 [place] [facility] AT [facility] Provider Note   CSN: [id number] Arrival date & time: 10/14/23  1557     Patient presents with: Back Pain   Jennifer Boyer is a 83 y.o. female.   83 year old female here today with back pain.  Symptoms have been ongoing for the last 1 week.  She was seen in urgent care recommend she come here for further evaluation.  Patient states that her pain is worse when she stands up.  She has not had fever, chills or urinary symptoms.  No bowel or bladder incontinence.  No numbness or tingling in the legs.   Back Pain      Prior to Admission medications   Medication Sig Start Date End Date Taking? Authorizing Provider  methylPREDNISolone  (MEDROL  DOSEPAK) 4 MG TBPK tablet Take as instructed by dose packaging 10/14/23  Yes Mannie Pac T, DO  oxyCODONE -acetaminophen  (PERCOCET/ROXICET) 5-325 MG tablet Take 1 tablet by mouth every 6 (six) hours as needed for severe pain (pain score 7-10). 10/14/23  Yes Mannie Pac T, DO  acetaminophen  (TYLENOL ) 500 MG tablet Take 500-1,000 mg by mouth every 6 (six) hours as needed for moderate pain.    [provider]  cholecalciferol  (VITAMIN D ) 1000 units tablet Take 1,000 Units by mouth once a week.     [provider]  donepezil  (ARICEPT ) 10 MG tablet Take 1 tablet (10 mg total) by mouth at bedtime. 03/15/23   Penumalli, Vikram R, MD  escitalopram (LEXAPRO) 10 MG tablet Take 10 mg by mouth daily. 09/26/21   [provider]  insulin  aspart (NOVOLOG ) 100 UNIT/ML FlexPen Before each meal 3 times a day, 140-199 - 4 units, 200-250 - 6 units, 251-299 - 8 units,  300-349 - 10 units,  350 or above 12 units. Insulin  PEN if approved, provide syringes and needles if needed. 08/05/21   Singh, Prashant K, MD  Insulin  Glargine (BASAGLAR  KWIKPEN) 100 UNIT/ML Inject 55 Units into the skin daily. Patient taking differently: Inject 35 Units into the skin daily. 08/05/21   Singh, Prashant K, MD   JARDIANCE 10 MG TABS tablet Take 10 mg by mouth daily. 05/21/22   [provider]  levothyroxine  (SYNTHROID , LEVOTHROID) 75 MCG tablet Take 75 mcg by mouth daily before breakfast.    [provider]  memantine  (NAMENDA ) 10 MG tablet Take 1 tablet (10 mg total) by mouth 2 (two) times daily. 03/15/23   Penumalli, Eduard SAUNDERS, MD  metFORMIN  (GLUCOPHAGE ) 1000 MG tablet Take 1,000 mg by mouth 2 (two) times daily with a meal.    [provider]  pantoprazole  (PROTONIX ) 40 MG tablet Take 40 mg by mouth daily.    [provider]  rosuvastatin  (CRESTOR ) 20 MG tablet Take 20 mg by mouth daily.    [provider]    Allergies: Patient has no known allergies.    Review of Systems  Musculoskeletal:  Positive for back pain.    Updated Vital Signs BP 124/68   Pulse (!) 101   Temp 98.1 F (36.7 C) (Oral)   Resp 18   Ht 5' 6 (1.676 m)   Wt 104.3 kg   SpO2 97%   BMI 37.12 kg/m   Physical Exam Vitals and nursing note reviewed.  Constitutional:      Appearance: Normal appearance. She is not toxic-appearing.  Pulmonary:     Effort: Pulmonary effort is normal.  Abdominal:     General: Abdomen is flat.     Palpations: Abdomen  is soft.  Musculoskeletal:        General: Normal range of motion.     Comments: Positive straight leg test on the right.  Neurological:     General: No focal deficit present.     Mental Status: She is alert.     Comments: 5-5 strength with straight leg raise bilaterally.  5 out of strength with plantar and dorsi flexion.  No numbness or tingling in bilateral lower extremities.  No saddle anesthesia.     (all labs ordered are listed, but only abnormal results are displayed) Labs Reviewed  COMPREHENSIVE METABOLIC PANEL WITH GFR - Abnormal; Notable for the following components:      Result Value   CO2 21 (*)    Glucose, Bld 218 (*)    Total Protein 8.4 (*)    GFR, Estimated 56 (*)    All other components within normal limits   CBC WITH DIFFERENTIAL/PLATELET - Abnormal; Notable for the following components:   RBC 3.12 (*)    Hemoglobin 9.6 (*)    HCT 30.7 (*)    RDW 16.2 (*)    All other components within normal limits    EKG: None  Radiology: CT L-SPINE NO CHARGE Result Date: 10/14/2023 EXAM: CT OF THE LUMBAR SPINE WITHOUT CONTRAST 10/14/2023 05:36:50 PM TECHNIQUE: CT of the lumbar spine was performed without the administration of intravenous contrast. Multiplanar reformatted images are provided for review. Automated exposure control, iterative reconstruction, and/or weight based adjustment of the mA/kV was utilized to reduce the radiation dose to as low as reasonably achievable. COMPARISON: MRI of the lumbar spine dated 08/01/2017. CLINICAL HISTORY: Pt POV reporting lower back pain past week, seen at UC, urine neg, advised to come to ED for further evaluation. FINDINGS: BONES AND ALIGNMENT: Normal vertebral body heights. No acute fracture or suspicious bone lesion. Normal alignment. Stable L5-S1 left paracentral posterior disc herniation (series 7, image 134) with mild narrowing of the spinal canal at this level, status post left hemilaminectomy. Right sacral stimulator. DEGENERATIVE CHANGES: Mild-to-moderate degenerative changes of the lower thoracic spine, most prominent at T9-10. Mild degenerative changes of the lumbar spine, most prominent at L5-S1. SOFT TISSUES: No acute abnormality. IMPRESSION: 1. No acute abnormalities. 2. Stable L5-S1 disc herniation with hemilaminectomy. Electronically signed by: Pinkie Pebbles MD 10/14/2023 06:14 PM EDT RP Workstation: [id number]   CT Renal Stone Study Result Date: 10/14/2023 EXAM: CT UROGRAM 10/14/2023 05:36:50 PM TECHNIQUE: CT of the abdomen and pelvis was performed without the administration of intravenous contrast as per CT urogram protocol. Multiplanar reformatted images as well as MIP urogram images are provided for review. Automated exposure control, iterative  reconstruction, and/or weight based adjustment of the mA/kV was utilized to reduce the radiation dose to as low as reasonably achievable. COMPARISON: None available. CLINICAL HISTORY: Abdominal/flank pain, stone suspected. Pt POV reporting lower back pain past week, seen at UC, urine neg, advised to come to ED for further evaluation. FINDINGS: LOWER CHEST: Small hiatal hernia. LIVER: Mild hepatic steatosis. GALLBLADDER AND BILE DUCTS: Status post cholecystectomy. No biliary ductal dilatation. SPLEEN: No acute abnormality. PANCREAS: No acute abnormality. ADRENAL GLANDS: No acute abnormality. KIDNEYS, URETERS AND BLADDER: No stones in the kidneys or ureters. No hydronephrosis. No perinephric or periureteral stranding. Urinary bladder is unremarkable. GI AND BOWEL: Stomach demonstrates no acute abnormality. Sigmoid diverticulosis, without evidence of diverticulitis. Normal appendix (image 41). There is no bowel obstruction. PERITONEUM AND RETROPERITONEUM: No ascites. No free air. VASCULATURE: Atherosclerotic calcifications of the abdominal aorta and  branch vessels. LYMPH NODES: No lymphadenopathy. REPRODUCTIVE ORGANS: Uterus and bilateral ovaries are within normal limits. BONES AND SOFT TISSUES: Small fat-containing periumbilical hernia. No acute osseous abnormality. Dedicated lumbar spine evaluation reported separately. IMPRESSION: 1. Sigmoid diverticulosis, without evidence of diverticulitis. 2. Mild hepatic steatosis. 3. Dedicated lumbar spine evaluation reported separately. Electronically signed by: Pinkie Pebbles MD 10/14/2023 06:02 PM EDT RP Workstation: [id number]   DG Sacrum/Coccyx Result Date: 10/14/2023 CLINICAL DATA:  Low back pain for a few weeks.  No injury. EXAM: SACRUM AND COCCYX - 2+ VIEW COMPARISON:  None Available. FINDINGS: Right lower quadrant generator pack with stimulator lead tips projecting over the right sciatic region. Rectosigmoid stool limits evaluation but the visualized sacral  coccygeal spine appears intact. No evidence of acute fracture or dislocation. No focal bone lesion or bone destruction. Sacral struts are symmetrical. Degenerative changes are noted in the lower lumbar spine. Vascular calcifications. Calcified phleboliths in the pelvis. IMPRESSION: Degenerative changes in the lumbar spine. Visualized sacrum appears intact. Electronically Signed   By: Elsie Gravely M.D.   On: 10/14/2023 15:24     Procedures   Medications Ordered in the ED  oxyCODONE -acetaminophen  (PERCOCET/ROXICET) 5-325 MG per tablet 1 tablet (1 tablet Oral Given 10/14/23 2002)                                    Medical Decision Making 83 year old female is here today for back pain.  Differential diagnoses include musculoskeletal back pain, consider cord compression, consider infection, consider nephritis, consider nephrolithiasis.  Plan-patient overall well-appearing.  She has a normal neurological exam.  Had imaging at triage which was negative for acute process, no nephrolithiasis.  Negative urinalysis at urgent care.  There is no evidence of cord compression, no evidence of infection.  Will symptomatically treat likely musculoskeletal back pain.  She will follow-up with her primary care doctor.  Return precautions were discussed at bedside.  Amount and/or Complexity of Data Reviewed Labs: ordered.  Risk Prescription drug management.        Final diagnoses:  Acute right-sided low back pain without sciatica    ED Discharge Orders          Ordered    oxyCODONE -acetaminophen  (PERCOCET/ROXICET) 5-325 MG tablet  Every 6 hours PRN        10/14/23 2008    methylPREDNISolone  (MEDROL  DOSEPAK) 4 MG TBPK tablet        10/14/23 2008               Gravely Pac T, DO 10/14/23 2010

## 2023-10-14 NOTE — ED Triage Notes (Signed)
 Lower back pain for the past weeks, pt denies any fall or injury, no burning sensation with urination or blood on the urine. Pt using ibuprofen with no relief.

## 2023-10-14 NOTE — ED Notes (Signed)
 Reviewed AVS/discharge instructions with patient. Time allotted for and all questions answered. Patient is agreeable for d/c and escorted to ED exit by staff.

## 2023-10-15 ENCOUNTER — Inpatient Hospital Stay (HOSPITAL_COMMUNITY): Admission: RE | Admit: 2023-10-15 | Payer: Self-pay | Source: Ambulatory Visit

## 2023-10-15 DIAGNOSIS — M545 Low back pain, unspecified: Secondary | ICD-10-CM | POA: Diagnosis not present

## 2023-10-16 ENCOUNTER — Telehealth (HOSPITAL_COMMUNITY): Payer: Self-pay

## 2023-10-16 LAB — URINE CULTURE: Culture: >=100000 — AB

## 2023-10-16 MED ORDER — PHENAZOPYRIDINE HCL 200 MG PO TABS: 200.0000 mg | ORAL_TABLET | Freq: Three times a day (TID) | ORAL | 0 refills | Status: DC

## 2023-10-16 MED ORDER — CEPHALEXIN 500 MG PO CAPS: 500.0000 mg | ORAL_CAPSULE | Freq: Four times a day (QID) | ORAL | 0 refills | Status: DC

## 2023-10-16 NOTE — Telephone Encounter (Signed)
 I was asked to review the patient's chart from her recent ER visit.  The patient is having urinary symptoms and it appears that she is growing E. coli that is pansensitive from her urine culture.  A prescription for Keflex  is sent to the patient's pharmacy on record.

## 2023-10-16 NOTE — ED Notes (Addendum)
 10/16/2023 - EMS called said patient has called them out to home, patient had urine culture positive for ECOLI (9/25) patient requesting antibiotic be sent in to  pharmacy is [facility] on [address] . This nurse verified no medication allergies, put in request to Dr. Charlyn who agreed to request - nurse notified patient/family at [phone].

## 2023-10-18 ENCOUNTER — Other Ambulatory Visit: Payer: Self-pay

## 2023-10-18 ENCOUNTER — Emergency Department (HOSPITAL_COMMUNITY)

## 2023-10-18 ENCOUNTER — Inpatient Hospital Stay (HOSPITAL_COMMUNITY)
Admission: EM | Admit: 2023-10-18 | Discharge: 2023-10-24 | DRG: 552 | Disposition: A | Discharge status: Skilled Nursing Facility | Source: Skilled Nursing Facility | Attending: Internal Medicine | Admitting: Internal Medicine

## 2023-10-18 ENCOUNTER — Encounter (HOSPITAL_COMMUNITY): Payer: Self-pay

## 2023-10-18 ENCOUNTER — Ambulatory Visit (HOSPITAL_COMMUNITY): Payer: Self-pay

## 2023-10-18 DIAGNOSIS — K5730 Diverticulosis of large intestine without perforation or abscess without bleeding: Secondary | ICD-10-CM | POA: Diagnosis not present

## 2023-10-18 DIAGNOSIS — Z79899 Other long term (current) drug therapy: Secondary | ICD-10-CM

## 2023-10-18 DIAGNOSIS — Z7984 Long term (current) use of oral hypoglycemic drugs: Secondary | ICD-10-CM

## 2023-10-18 DIAGNOSIS — Z794 Long term (current) use of insulin: Secondary | ICD-10-CM

## 2023-10-18 DIAGNOSIS — Z79890 Hormone replacement therapy (postmenopausal): Secondary | ICD-10-CM

## 2023-10-18 DIAGNOSIS — F0390 Unspecified dementia without behavioral disturbance: Secondary | ICD-10-CM | POA: Diagnosis present

## 2023-10-18 DIAGNOSIS — E785 Hyperlipidemia, unspecified: Secondary | ICD-10-CM | POA: Diagnosis present

## 2023-10-18 DIAGNOSIS — R262 Difficulty in walking, not elsewhere classified: Secondary | ICD-10-CM | POA: Diagnosis present

## 2023-10-18 DIAGNOSIS — M5126 Other intervertebral disc displacement, lumbar region: Secondary | ICD-10-CM | POA: Diagnosis present

## 2023-10-18 DIAGNOSIS — Z9682 Presence of neurostimulator: Secondary | ICD-10-CM

## 2023-10-18 DIAGNOSIS — M51369 Other intervertebral disc degeneration, lumbar region without mention of lumbar back pain or lower extremity pain: Secondary | ICD-10-CM | POA: Diagnosis not present

## 2023-10-18 DIAGNOSIS — B9620 Unspecified Escherichia coli [E. coli] as the cause of diseases classified elsewhere: Secondary | ICD-10-CM | POA: Diagnosis present

## 2023-10-18 DIAGNOSIS — E611 Iron deficiency: Secondary | ICD-10-CM | POA: Diagnosis present

## 2023-10-18 DIAGNOSIS — G8929 Other chronic pain: Secondary | ICD-10-CM | POA: Diagnosis present

## 2023-10-18 DIAGNOSIS — M549 Dorsalgia, unspecified: Secondary | ICD-10-CM | POA: Diagnosis not present

## 2023-10-18 DIAGNOSIS — R350 Frequency of micturition: Secondary | ICD-10-CM | POA: Diagnosis present

## 2023-10-18 DIAGNOSIS — K449 Diaphragmatic hernia without obstruction or gangrene: Secondary | ICD-10-CM | POA: Diagnosis not present

## 2023-10-18 DIAGNOSIS — K8689 Other specified diseases of pancreas: Secondary | ICD-10-CM | POA: Diagnosis not present

## 2023-10-18 DIAGNOSIS — Z9013 Acquired absence of bilateral breasts and nipples: Secondary | ICD-10-CM

## 2023-10-18 DIAGNOSIS — I10 Essential (primary) hypertension: Secondary | ICD-10-CM | POA: Diagnosis present

## 2023-10-18 DIAGNOSIS — N390 Urinary tract infection, site not specified: Secondary | ICD-10-CM | POA: Diagnosis present

## 2023-10-18 DIAGNOSIS — M5136 Other intervertebral disc degeneration, lumbar region with discogenic back pain only: Secondary | ICD-10-CM | POA: Diagnosis not present

## 2023-10-18 DIAGNOSIS — M797 Fibromyalgia: Secondary | ICD-10-CM | POA: Diagnosis present

## 2023-10-18 DIAGNOSIS — E039 Hypothyroidism, unspecified: Secondary | ICD-10-CM | POA: Diagnosis present

## 2023-10-18 DIAGNOSIS — M545 Low back pain, unspecified: Principal | ICD-10-CM

## 2023-10-18 DIAGNOSIS — E1110 Type 2 diabetes mellitus with ketoacidosis without coma: Secondary | ICD-10-CM | POA: Diagnosis present

## 2023-10-18 DIAGNOSIS — K429 Umbilical hernia without obstruction or gangrene: Secondary | ICD-10-CM | POA: Diagnosis not present

## 2023-10-18 DIAGNOSIS — K219 Gastro-esophageal reflux disease without esophagitis: Secondary | ICD-10-CM | POA: Diagnosis present

## 2023-10-18 DIAGNOSIS — M5459 Other low back pain: Secondary | ICD-10-CM | POA: Diagnosis present

## 2023-10-18 HISTORY — DX: Unspecified dementia, unspecified severity, without behavioral disturbance, psychotic disturbance, mood disturbance, and anxiety: F03.90

## 2023-10-18 LAB — URINALYSIS, ROUTINE W REFLEX MICROSCOPIC
Bacteria, UA: NONE SEEN
Bilirubin Urine: NEGATIVE
Glucose, UA: >=500 mg/dL — AB
Hgb urine dipstick: NEGATIVE
Ketones, ur: NEGATIVE mg/dL
Leukocytes,Ua: NEGATIVE
Nitrite: NEGATIVE
Protein, ur: NEGATIVE mg/dL
Specific Gravity, Urine: 1.03 (ref 1.005–1.030)
pH: 5 (ref 5.0–8.0)

## 2023-10-18 LAB — CBC
HCT: 35 % — ABNORMAL LOW (ref 36.0–46.0)
Hemoglobin: 10.5 g/dL — ABNORMAL LOW (ref 12.0–15.0)
MCH: 30 pg (ref 26.0–34.0)
MCHC: 30 g/dL (ref 30.0–36.0)
MCV: 100 fL (ref 80.0–100.0)
Platelets: 279 K/uL (ref 150–400)
RBC: 3.5 MIL/uL — ABNORMAL LOW (ref 3.87–5.11)
RDW: 16.3 % — ABNORMAL HIGH (ref 11.5–15.5)
WBC: 7.9 K/uL (ref 4.0–10.5)
nRBC: 0.4 % — ABNORMAL HIGH (ref 0.0–0.2)

## 2023-10-18 LAB — BASIC METABOLIC PANEL WITH GFR
Anion gap: 14 (ref 5–15)
BUN: 28 mg/dL — ABNORMAL HIGH (ref 8–23)
CO2: 21 mmol/L — ABNORMAL LOW (ref 22–32)
Calcium: 9.8 mg/dL (ref 8.9–10.3)
Chloride: 102 mmol/L (ref 98–111)
Creatinine, Ser: 0.92 mg/dL (ref 0.44–1.00)
GFR, Estimated: >60 mL/min (ref >60)
Glucose, Bld: 243 mg/dL — ABNORMAL HIGH (ref 70–99)
Potassium: 4.2 mmol/L (ref 3.5–5.1)
Sodium: 137 mmol/L (ref 135–145)

## 2023-10-18 MED ORDER — KETOROLAC TROMETHAMINE 30 MG/ML IJ SOLN
15.0000 mg | Freq: Once | INTRAMUSCULAR | Status: AC
  Administered 2023-10-18: 15 mg via INTRAVENOUS
  Filled 2023-10-18: qty 1

## 2023-10-18 NOTE — ED Provider Notes (Signed)
 [facility] AT [facility] Provider Note   CSN: [id number] Arrival date & time: 10/18/23  1858     Patient presents with: Back Pain and Urinary Frequency   Jennifer Boyer is a 83 y.o. female.  Patient with past medical history significant for fibromyalgia, T2DM, HTN, GERD, dementia, right sacral stimulator.  Patient presents to the emergency room complaining of low back pain which began over a week ago.  She was seen at both urgent care and the emergency department for the same.  Imaging at that time showed no acute fracture.  She was diagnosed with a UTI and prescribed Keflex  as the urine grew E. coli which was sensitive to all tested antibiotics.  Patient has taken the antibiotics for 2 days.  She states her pain is worse and she is having a hard time walking/standing.  She lives in independent living at baseline.  She denies any fall, nausea, vomiting, chest pain, shortness of breath, abdominal pain.  She does continue to complain of some dysuria, and now complains of some mild incontinence.     Back Pain Urinary Frequency       Prior to Admission medications   Medication Sig Start Date End Date Taking? Authorizing Provider  acetaminophen  (TYLENOL ) 500 MG tablet Take 500-1,000 mg by mouth every 6 (six) hours as needed for moderate pain.   Yes [provider]  ADMELOG SOLOSTAR 100 UNIT/ML KwikPen Inject into the skin. 09/14/23  Yes [provider]  cephALEXin  (KEFLEX ) 500 MG capsule Take 1 capsule (500 mg total) by mouth 4 (four) times daily. 10/16/23  Yes Ula Prentice SAUNDERS, MD  cholecalciferol  (VITAMIN D ) 1000 units tablet Take 1,000 Units by mouth once a week.    Yes [provider]  donepezil  (ARICEPT ) 10 MG tablet Take 1 tablet (10 mg total) by mouth at bedtime. 03/15/23  Yes Penumalli, Vikram R, MD  escitalopram (LEXAPRO) 10 MG tablet Take 10 mg by mouth daily. 09/26/21  Yes [provider]  insulin  aspart (NOVOLOG ) 100  UNIT/ML FlexPen Before each meal 3 times a day, 140-199 - 4 units, 200-250 - 6 units, 251-299 - 8 units,  300-349 - 10 units,  350 or above 12 units. Insulin  PEN if approved, provide syringes and needles if needed. 08/05/21  Yes Singh, Prashant K, MD  Insulin  Glargine (BASAGLAR  KWIKPEN) 100 UNIT/ML Inject 55 Units into the skin daily. Patient taking differently: Inject 35 Units into the skin daily. 08/05/21  Yes Singh, Prashant K, MD  JARDIANCE 10 MG TABS tablet Take 10 mg by mouth daily. 05/21/22  Yes [provider]  levothyroxine  (SYNTHROID , LEVOTHROID) 75 MCG tablet Take 75 mcg by mouth daily before breakfast.   Yes [provider]  memantine  (NAMENDA ) 10 MG tablet Take 1 tablet (10 mg total) by mouth 2 (two) times daily. 03/15/23  Yes Penumalli, Vikram R, MD  metFORMIN  (GLUCOPHAGE ) 1000 MG tablet Take 1,000 mg by mouth 2 (two) times daily with a meal.   Yes [provider]  oxyCODONE -acetaminophen  (PERCOCET/ROXICET) 5-325 MG tablet Take 1 tablet by mouth every 6 (six) hours as needed for severe pain (pain score 7-10). 10/14/23  Yes Mannie Pac T, DO  pantoprazole  (PROTONIX ) 40 MG tablet Take 40 mg by mouth daily.   Yes [provider]  phenazopyridine (PYRIDIUM) 200 MG tablet Take 1 tablet (200 mg total) by mouth 3 (three) times daily. 10/16/23  Yes Ula Prentice SAUNDERS, MD  rosuvastatin  (CRESTOR ) 20 MG tablet Take 20  mg by mouth daily.   Yes [provider]  cephALEXin  (KEFLEX ) 500 MG capsule Take 1 capsule (500 mg total) by mouth 4 (four) times daily. 10/16/23   Ula Prentice SAUNDERS, MD  methylPREDNISolone  (MEDROL  DOSEPAK) 4 MG TBPK tablet Take as instructed by dose packaging 10/14/23   Mannie Fairy DASEN, DO  phenazopyridine (PYRIDIUM) 200 MG tablet Take 1 tablet (200 mg total) by mouth 3 (three) times daily. 10/16/23   Ula Prentice SAUNDERS, MD    Allergies: Patient has no known allergies.    Review of Systems  Genitourinary:  Positive for frequency.  Musculoskeletal:   Positive for back pain.    Updated Vital Signs BP (!) 149/95 (BP Location: Right Arm)   Pulse 100   Temp 98.3 F (36.8 C) (Oral)   Resp 18   SpO2 96%   Physical Exam Vitals and nursing note reviewed.  Constitutional:      Appearance: Normal appearance. She is not toxic-appearing.  Cardiovascular:     Rate and Rhythm: Normal rate.  Pulmonary:     Effort: Pulmonary effort is normal.  Abdominal:     General: Abdomen is flat.     Palpations: Abdomen is soft.     Tenderness: There is no abdominal tenderness.  Musculoskeletal:        General: Normal range of motion.     Comments: Positive straight leg test on the right.  Neurological:     General: No focal deficit present.     Mental Status: She is alert.     Comments: 5-5 strength with straight leg raise bilaterally.  5 out of strength with plantar and dorsi flexion.  No numbness or tingling in bilateral lower extremities.  No saddle anesthesia.     (all labs ordered are listed, but only abnormal results are displayed) Labs Reviewed  URINALYSIS, ROUTINE W REFLEX MICROSCOPIC - Abnormal; Notable for the following components:      Result Value   Glucose, UA >=500 (*)    All other components within normal limits  BASIC METABOLIC PANEL WITH GFR - Abnormal; Notable for the following components:   CO2 21 (*)    Glucose, Bld 243 (*)    BUN 28 (*)    All other components within normal limits  CBC - Abnormal; Notable for the following components:   RBC 3.50 (*)    Hemoglobin 10.5 (*)    HCT 35.0 (*)    RDW 16.3 (*)    nRBC 0.4 (*)    All other components within normal limits  HEMOGLOBIN A1C  CBC  CREATININE, SERUM  BASIC METABOLIC PANEL WITH GFR  CBC  PROTIME-INR    EKG: None  Radiology: CT Angio Chest/Abd/Pel for Dissection W and/or Wo Contrast Result Date: 10/19/2023 CLINICAL DATA:  Back pain worsening over the past week, diagnosed with UTI and placed on antibiotics 3 days ago with worsening pain and urinary  frequency despite antibiotics. EXAM: CT ANGIOGRAPHY CHEST, ABDOMEN AND PELVIS TECHNIQUE: Non-contrast CT of the chest was initially obtained. Multidetector CT imaging through the chest, abdomen and pelvis was performed using the standard protocol during bolus administration of intravenous contrast. Multiplanar reconstructed images and MIPs were obtained and reviewed to evaluate the vascular anatomy. RADIATION DOSE REDUCTION: This exam was performed according to the departmental dose-optimization program which includes automated exposure control, adjustment of the mA and/or kV according to patient size and/or use of iterative reconstruction technique. CONTRAST:  OMNIPAQUE IOHEXOL 350 MG/ML SOLN COMPARISON:  CT abdomen and  pelvis without contrast earlier today, CT abdomen and pelvis without contrast 10/14/2023. FINDINGS: CTA CHEST FINDINGS Cardiovascular: The pulmonary arteries are opacified more than the aorta. The pulmonary trunk is prominent measuring 3.2 cm. No arterial embolism is seen. The pulmonary veins are normal caliber. The cardiac size is normal. There is no pericardial effusion. There are trace scattered single-vessel calcific plaques in the LAD coronary artery. There is aortic atherosclerosis and tortuosity and scattered calcification in the great vessels without aneurysm, stenosis or dissection. Great vessel branching standard. Mediastinum/Nodes: No enlarged mediastinal, hilar, or axillary lymph nodes. The lower poles of the thyroid  gland, trachea the main bronchi are unremarkable. There is moderate thickening at the EG junction. Endoscopic follow-up recommended. There is a moderate-sized hiatal hernia, with a small amount of fluid in the hernia sac to the right. Lungs/Pleura: There is diffuse bronchial thickening. Central airways are clear. There are mild biapical reticular scarring changes and mild posterior atelectasis in the lungs without consolidation, effusion or suspicious nodule. No  pneumothorax. There is mild subpleural reticulation in the right lung base. Musculoskeletal: Osteopenia with multilevel degenerative discs thoracic spine, thoracic spondylosis. Multilevel interbody ankylosis. No acute or other significant osseous findings. There are bilateral breast implants with partially calcified capsules. No chest wall mass is seen. Review of the MIP images confirms the above findings. CTA ABDOMEN AND PELVIS FINDINGS VASCULAR Aorta: Normal in caliber with mild patchy non stenosing calcifications. No aneurysm or dissection. Celiac: There is a 40% stenosis in the proximal vessel due to compression by the median arcuate ligament of the diaphragm. The remainder is widely patent. SMA: Patent without evidence of aneurysm, dissection, vasculitis or significant stenosis. There are nonstenosing ostial calcific plaques. Renals: Both renal arteries are patent without evidence of aneurysm, dissection, vasculitis, fibromuscular dysplasia or significant stenosis. Both demonstrate nonstenosing ostial calcific plaques. IMA: Patent without evidence of aneurysm, dissection, vasculitis or significant stenosis. Inflow: Patent without evidence of aneurysm, dissection, vasculitis or significant stenosis. There are scattered calcific plaques in both common iliac and both internal iliac arteries. The external iliac arteries are plaque free. Veins: No obvious venous abnormality within the limitations of this arterial phase study. Review of the MIP images confirms the above findings. NON-VASCULAR Hepatobiliary: There is a calcified granuloma in the dome of the right lobe. The liver is 20 cm length with mild steatosis without mass enhancement. Gallbladder is absent without significant biliary prominence. Pancreas: Partially atrophic.  No other focal abnormality. Spleen: No abnormality.  No splenomegaly. Adrenals/Urinary Tract: There is no adrenal mass. There are no renal mass enhancement. Perinephric fat stranding is  unchanged. There is no urinary stone or obstruction. There is faint perivesical stranding which may be seen with cystitis. There is mild bladder thickening versus underdistention. Stomach/Bowel: There are thickened folds in the stomach. No dilatation or wall thickening of the bowel. An appendix is not seen. Moderate retained stool noted ascending and proximal transverse colon. Descending and sigmoid diverticulosis again is noted without diverticulitis. Lymphatic: No lymphadenopathy is seen. Reproductive: Uterus and bilateral adnexa are unremarkable. Other: Small umbilical fat hernia. No incarcerated hernia. No free fluid or free air. Musculoskeletal: Osteopenia, multilevel degenerative changes. There is concern for a lytic lesion of the anterior superior L1 vertebral body to the right measuring 1.9 cm. This would be an atypical location for a Schmorl's node. This was not seen on the lumbar MRI from 08/01/2017. Lumbar spine MRI recommended without and with contrast. Left hemilaminectomy at L5 is again seen. There are degenerative changes of  the hips. Right-sided sacral stimulator wiring noted with right flank implanted power source. Review of the MIP images confirms the above findings. IMPRESSION: 1. Aortic and branch vessel atherosclerosis without aneurysm, dissection or stenosis. 2. Prominent pulmonary trunk but no arterial embolism is seen. 3. Hiatal hernia with moderate thickening at the EG junction. Endoscopic follow-up recommended. 4. Bronchitis. 5. Likely gastritis. 6. Cystitis versus bladder nondistention. 7. Constipation and diverticulosis. 8. 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 9. 1.9 cm lytic lesion suspected of the anterior superior L1 vertebral body to the right. This would be an atypical location for a Schmorl's node. This was not seen on the lumbar MRI from 08/01/2017. Lumbar spine MRI recommended without and with contrast. 10.  Osteopenia and degenerative change. Aortic Atherosclerosis (ICD10-I70.0). Electronically Signed   By: Francis Quam M.D.   On: 10/19/2023 01:25   CT L-SPINE NO CHARGE Result Date: 10/18/2023 EXAM: CT OF THE LUMBAR SPINE WITHOUT CONTRAST 10/18/2023 10:56:32 PM TECHNIQUE: CT of the lumbar spine was performed without the administration of intravenous contrast. Multiplanar reformatted images are provided for review. Automated exposure control, iterative reconstruction, and/or weight based adjustment of the mA/kV was utilized to reduce the radiation dose to as low as reasonably achievable. COMPARISON: Lumbar spine CT 4 days ago 10/14/2023 CLINICAL HISTORY: PT states she has been having back pain for over a week, was diagnosed with a possible UTI put on abx on Saturday but the pain has gotten worse and has started having urinary frequency and has started to be incontinent. FINDINGS: BONES AND ALIGNMENT: Normal vertebral body heights. No acute fracture or suspicious bone lesion. Normal alignment. Left hemilaminectomy at L5. Presacral stimulator on the right. DEGENERATIVE CHANGES: Stable multilevel degenerative disc disease and facet hypertrophy with diffuse broad-based disc bulge. SOFT TISSUES: No acute abnormality. IMPRESSION: 1. No acute findings or change from CT 4 days ago. 2. Multilevel degenerative disc disease and facet hypertrophy. 3. Left hemilaminectomy at L5. Electronically signed by: Andrea Gasman MD 10/18/2023 11:17 PM EDT RP Workstation: [id number]   CT Renal Stone Study Result Date: 10/18/2023 EXAM: CT UROGRAM 10/18/2023 10:56:32 PM TECHNIQUE: CT of the abdomen and pelvis was performed without the administration of intravenous contrast. Multiplanar reformatted images as well as MIP urogram images are provided for review. Automated exposure control, iterative reconstruction, and/or weight based adjustment of the mA/kV was utilized to reduce the radiation dose to as low as reasonably achievable.  COMPARISON: CT 10/14/2023, 4 days ago. CLINICAL HISTORY: Abdominal/flank pain, stone suspected. Triage Notes: PT states she has been having back pain for over a week, was diagnosed with a possible UTI put on abx on Saturday but the pain has gotten worse and has started having urinary frequency and has started to be incontinent. FINDINGS: LOWER CHEST: Small hiatal hernia. LIVER: Punctate hepatic granuloma. GALLBLADDER AND BILE DUCTS: Cholecystectomy. No biliary ductal dilatation. SPLEEN: No acute abnormality. PANCREAS: Pancreatic parenchymal atrophy. No ductal dilatation or inflammation. ADRENAL GLANDS: No acute abnormality. KIDNEYS, URETERS AND BLADDER: No renal stones or hydronephrosis. No evidence of renal inflammation. Urinary bladder is minimally distended, normal for degree of extension. GI AND BOWEL: Small hiatal hernia. Colonic diverticulosis without diverticulitis. Moderate colonic stool burden. Fecalization of distal small bowel contents. There is no bowel obstruction. PERITONEUM AND RETROPERITONEUM: No ascites. No free air. VASCULATURE: Aorta is normal in caliber. Aortic atherosclerosis without aneurysm. LYMPH NODES: No lymphadenopathy. REPRODUCTIVE ORGANS: No acute abnormality. BONES AND SOFT TISSUES: Bilateral  breast implants. Small fat-containing umbilical hernia. No acute osseous abnormality. Lumbar spine reported separately on lumbar spine reformats. No focal soft tissue abnormality. IMPRESSION: 1. No renal stones or hydronephrosis. No acute intra-abdominal or pelvic abnormality. 2. Diverticulosis without diverticulitis. 3. Aortic atherosclerosis (ICD10-170.0) Electronically signed by: Andrea Gasman MD 10/18/2023 11:14 PM EDT RP Workstation: [id number]     Procedures   Medications Ordered in the ED  lidocaine  (LIDODERM ) 5 % 1 patch (1 patch Transdermal Patch Applied 10/19/23 0104)  insulin  aspart (novoLOG ) injection 0-6 Units (has no administration in time range)  heparin injection 5,000  Units (has no administration in time range)  traMADol (ULTRAM) tablet 50 mg (has no administration in time range)  fentaNYL  (SUBLIMAZE ) injection 12.5-50 mcg (has no administration in time range)  methocarbamol (ROBAXIN) injection 500 mg (has no administration in time range)  ondansetron  (ZOFRAN ) tablet 4 mg (has no administration in time range)    Or  ondansetron  (ZOFRAN ) injection 4 mg (has no administration in time range)  albuterol (PROVENTIL) (2.5 MG/3ML) 0.083% nebulizer solution 2.5 mg (has no administration in time range)  hydrALAZINE  (APRESOLINE ) injection 10 mg (has no administration in time range)  ketorolac  (TORADOL ) 30 MG/ML injection 15 mg (15 mg Intravenous Given 10/18/23 2300)  iohexol (OMNIPAQUE) 350 MG/ML injection 100 mL (100 mLs Intravenous Contrast Given 10/19/23 0017)  dexamethasone  (DECADRON ) injection 10 mg (10 mg Intravenous Given 10/19/23 0100)  acetaminophen  (OFIRMEV ) IV 1,000 mg (0 mg Intravenous Stopped 10/19/23 0226)  fentaNYL  (SUBLIMAZE ) injection 50 mcg (50 mcg Intravenous Given 10/19/23 0234)                                    Medical Decision Making Amount and/or Complexity of Data Reviewed Labs: ordered. Radiology: ordered.  Risk Prescription drug management. Decision regarding hospitalization.   This patient presents to the ED for concern of back pain, this involves an extensive number of treatment options, and is a complaint that carries with it a high risk of complications and morbidity.  The differential diagnosis includes fracture, dislocation, soft tissue injury, disc herniation, chronic pain flare   Co morbidities / Chronic conditions that complicate the patient evaluation  Lumbar herniation, T2DM, HTN, dementia   Additional history obtained:  Additional history obtained from EMR External records from outside source obtained and reviewed including recent imaging   Lab Tests:  I Ordered, and personally interpreted labs.  The pertinent  results include:  Grossly unremarkable BMP, CBC, UA (infection resolved)   Imaging Studies ordered:  I ordered imaging studies including CT renal stone study, l-spine, dissection study  I independently visualized and interpreted imaging which showed no acute findings on renal stone study, no new findings on l-spine 1. Aortic and branch vessel atherosclerosis without aneurysm,  dissection or stenosis.  2. Prominent pulmonary trunk but no arterial embolism is seen.  3. Hiatal hernia with moderate thickening at the EG junction.  Endoscopic follow-up recommended.  4. Bronchitis.  5. Likely gastritis.  6. Cystitis versus bladder nondistention.  7. Constipation and diverticulosis.  8. 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  9. 1.9 cm lytic lesion suspected of the anterior superior L1  vertebral body to the right. This would be an atypical location for  a Schmorl's node. This was not seen on the lumbar MRI from  08/01/2017. Lumbar spine MRI recommended without and with  contrast.  10. Osteopenia and degenerative change.    Aortic Atherosclerosis   I agree with the radiologist interpretation   Problem List / ED Course / Critical interventions / Medication management   I ordered medication including toradol , decadron , IV tylenol , lidocaine  patch   Reevaluation of the patient after these medicines showed that the patient stayed the same I have reviewed the patients home medicines and have made adjustments as needed   Consultations Obtained:  I requested consultation with the hospitalist, Dr.Thomas,  and discussed lab and imaging findings as well as pertinent plan - they recommend: will see for admission   Social Determinants of Health:  Patient lives in independent living   Test / Admission - Considered:  Patient with no findings on imaging or lab workup to explain severe pain.  She has history of  fibromyalgia and sacral stimulator.  She also has a known lumbar herniation and degenerative disc disease.  Patient with intractable pain, lives alone, and is unable to walk at this time due to pain.       Final diagnoses:  Acute bilateral low back pain without sciatica  Intractable back pain    ED Discharge Orders     None          Logan Ubaldo KATHEE DEVONNA 10/19/23 0326    Palumbo, April, MD 10/19/23 [phone]

## 2023-10-18 NOTE — ED Triage Notes (Signed)
 PT states she has been having back pain for over a week, was diagnosed with a possible UTI put on abx on Saturday but the pain has gotten worse and has started having urinary frequency and has started to be incontinent.

## 2023-10-18 NOTE — ED Provider Notes (Incomplete)
 [place] EMERGENCY DEPARTMENT AT [facility] Provider Note   CSN: [id number] Arrival date & time: 10/18/23  1858     Patient presents with: Back Pain and Urinary Frequency   Jennifer Boyer is a 83 y.o. female.  Patient with past medical history significant for fibromyalgia, T2DM, HTN, GERD, dementia.  Patient presents to the emergency room complaining of low back pain which began over a week ago.  She was seen at both urgent care and the emergency department   {Add pertinent medical, surgical, social history, OB history to HPI:32947}  Back Pain Urinary Frequency       Prior to Admission medications   Medication Sig Start Date End Date Taking? Authorizing Provider  acetaminophen  (TYLENOL ) 500 MG tablet Take 500-1,000 mg by mouth every 6 (six) hours as needed for moderate pain.    [provider]  cephALEXin  (KEFLEX ) 500 MG capsule Take 1 capsule (500 mg total) by mouth 4 (four) times daily. 10/16/23   Ula Prentice SAUNDERS, MD  cephALEXin  (KEFLEX ) 500 MG capsule Take 1 capsule (500 mg total) by mouth 4 (four) times daily. 10/16/23   Ula Prentice SAUNDERS, MD  cholecalciferol  (VITAMIN D ) 1000 units tablet Take 1,000 Units by mouth once a week.     [provider]  donepezil  (ARICEPT ) 10 MG tablet Take 1 tablet (10 mg total) by mouth at bedtime. 03/15/23   Penumalli, Vikram R, MD  escitalopram (LEXAPRO) 10 MG tablet Take 10 mg by mouth daily. 09/26/21   [provider]  insulin  aspart (NOVOLOG ) 100 UNIT/ML FlexPen Before each meal 3 times a day, 140-199 - 4 units, 200-250 - 6 units, 251-299 - 8 units,  300-349 - 10 units,  350 or above 12 units. Insulin  PEN if approved, provide syringes and needles if needed. 08/05/21   Singh, Prashant K, MD  Insulin  Glargine (BASAGLAR  KWIKPEN) 100 UNIT/ML Inject 55 Units into the skin daily. Patient taking differently: Inject 35 Units into the skin daily. 08/05/21   Singh, Prashant K, MD  JARDIANCE 10 MG TABS tablet Take 10 mg by  mouth daily. 05/21/22   [provider]  levothyroxine  (SYNTHROID , LEVOTHROID) 75 MCG tablet Take 75 mcg by mouth daily before breakfast.    [provider]  memantine  (NAMENDA ) 10 MG tablet Take 1 tablet (10 mg total) by mouth 2 (two) times daily. 03/15/23   Penumalli, Vikram R, MD  metFORMIN  (GLUCOPHAGE ) 1000 MG tablet Take 1,000 mg by mouth 2 (two) times daily with a meal.    [provider]  methylPREDNISolone  (MEDROL  DOSEPAK) 4 MG TBPK tablet Take as instructed by dose packaging 10/14/23   Mannie Pac T, DO  oxyCODONE -acetaminophen  (PERCOCET/ROXICET) 5-325 MG tablet Take 1 tablet by mouth every 6 (six) hours as needed for severe pain (pain score 7-10). 10/14/23   Mannie Pac T, DO  pantoprazole  (PROTONIX ) 40 MG tablet Take 40 mg by mouth daily.    [provider]  phenazopyridine (PYRIDIUM) 200 MG tablet Take 1 tablet (200 mg total) by mouth 3 (three) times daily. 10/16/23   Ula Prentice SAUNDERS, MD  phenazopyridine (PYRIDIUM) 200 MG tablet Take 1 tablet (200 mg total) by mouth 3 (three) times daily. 10/16/23   Ula Prentice SAUNDERS, MD  rosuvastatin  (CRESTOR ) 20 MG tablet Take 20 mg by mouth daily.    [provider]    Allergies: Patient has no known allergies.    Review of Systems  Genitourinary:  Positive for frequency.  Musculoskeletal:  Positive for  back pain.    Updated Vital Signs BP 134/88   Pulse 90   Temp 98.4 F (36.9 C)   Resp 18   SpO2 96%   Physical Exam  (all labs ordered are listed, but only abnormal results are displayed) Labs Reviewed  URINALYSIS, ROUTINE W REFLEX MICROSCOPIC - Abnormal; Notable for the following components:      Result Value   Glucose, UA >=500 (*)    All other components within normal limits  BASIC METABOLIC PANEL WITH GFR - Abnormal; Notable for the following components:   CO2 21 (*)    Glucose, Bld 243 (*)    BUN 28 (*)    All other components within normal limits  CBC - Abnormal; Notable for the  following components:   RBC 3.50 (*)    Hemoglobin 10.5 (*)    HCT 35.0 (*)    RDW 16.3 (*)    nRBC 0.4 (*)    All other components within normal limits    EKG: None  Radiology: CT L-SPINE NO CHARGE Result Date: 10/18/2023 EXAM: CT OF THE LUMBAR SPINE WITHOUT CONTRAST 10/18/2023 10:56:32 PM TECHNIQUE: CT of the lumbar spine was performed without the administration of intravenous contrast. Multiplanar reformatted images are provided for review. Automated exposure control, iterative reconstruction, and/or weight based adjustment of the mA/kV was utilized to reduce the radiation dose to as low as reasonably achievable. COMPARISON: Lumbar spine CT 4 days ago 10/14/2023 CLINICAL HISTORY: PT states she has been having back pain for over a week, was diagnosed with a possible UTI put on abx on Saturday but the pain has gotten worse and has started having urinary frequency and has started to be incontinent. FINDINGS: BONES AND ALIGNMENT: Normal vertebral body heights. No acute fracture or suspicious bone lesion. Normal alignment. Left hemilaminectomy at L5. Presacral stimulator on the right. DEGENERATIVE CHANGES: Stable multilevel degenerative disc disease and facet hypertrophy with diffuse broad-based disc bulge. SOFT TISSUES: No acute abnormality. IMPRESSION: 1. No acute findings or change from CT 4 days ago. 2. Multilevel degenerative disc disease and facet hypertrophy. 3. Left hemilaminectomy at L5. Electronically signed by: Andrea Gasman MD 10/18/2023 11:17 PM EDT RP Workstation: [id number]   CT Renal Stone Study Result Date: 10/18/2023 EXAM: CT UROGRAM 10/18/2023 10:56:32 PM TECHNIQUE: CT of the abdomen and pelvis was performed without the administration of intravenous contrast. Multiplanar reformatted images as well as MIP urogram images are provided for review. Automated exposure control, iterative reconstruction, and/or weight based adjustment of the mA/kV was utilized to reduce the radiation dose  to as low as reasonably achievable. COMPARISON: CT 10/14/2023, 4 days ago. CLINICAL HISTORY: Abdominal/flank pain, stone suspected. Triage Notes: PT states she has been having back pain for over a week, was diagnosed with a possible UTI put on abx on Saturday but the pain has gotten worse and has started having urinary frequency and has started to be incontinent. FINDINGS: LOWER CHEST: Small hiatal hernia. LIVER: Punctate hepatic granuloma. GALLBLADDER AND BILE DUCTS: Cholecystectomy. No biliary ductal dilatation. SPLEEN: No acute abnormality. PANCREAS: Pancreatic parenchymal atrophy. No ductal dilatation or inflammation. ADRENAL GLANDS: No acute abnormality. KIDNEYS, URETERS AND BLADDER: No renal stones or hydronephrosis. No evidence of renal inflammation. Urinary bladder is minimally distended, normal for degree of extension. GI AND BOWEL: Small hiatal hernia. Colonic diverticulosis without diverticulitis. Moderate colonic stool burden. Fecalization of distal small bowel contents. There is no bowel obstruction. PERITONEUM AND RETROPERITONEUM: No ascites. No free air. VASCULATURE: Aorta is normal in caliber.  Aortic atherosclerosis without aneurysm. LYMPH NODES: No lymphadenopathy. REPRODUCTIVE ORGANS: No acute abnormality. BONES AND SOFT TISSUES: Bilateral breast implants. Small fat-containing umbilical hernia. No acute osseous abnormality. Lumbar spine reported separately on lumbar spine reformats. No focal soft tissue abnormality. IMPRESSION: 1. No renal stones or hydronephrosis. No acute intra-abdominal or pelvic abnormality. 2. Diverticulosis without diverticulitis. 3. Aortic atherosclerosis (ICD10-170.0) Electronically signed by: Andrea Gasman MD 10/18/2023 11:14 PM EDT RP Workstation: [id number]    {Document cardiac monitor, telemetry assessment procedure when appropriate:32947} Procedures   Medications Ordered in the ED  ketorolac  (TORADOL ) 30 MG/ML injection 15 mg (15 mg Intravenous Given 10/18/23  2300)      {Click here for ABCD2, HEART and other calculators REFRESH Note before signing:1}                              Medical Decision Making Amount and/or Complexity of Data Reviewed Labs: ordered. Radiology: ordered.  Risk Prescription drug management.   ***  {Document critical care time when appropriate  Document review of labs and clinical decision tools ie CHADS2VASC2, etc  Document your independent review of radiology images and any outside records  Document your discussion with family members, caretakers and with consultants  Document social determinants of health affecting pt's care  Document your decision making why or why not admission, treatments were needed:32947:::1}   Final diagnoses:  None    ED Discharge Orders     None

## 2023-10-18 NOTE — ED Notes (Addendum)
 Attempted Urine collection without success

## 2023-10-19 ENCOUNTER — Encounter (HOSPITAL_COMMUNITY): Payer: Self-pay

## 2023-10-19 DIAGNOSIS — Z79890 Hormone replacement therapy (postmenopausal): Secondary | ICD-10-CM | POA: Diagnosis not present

## 2023-10-19 DIAGNOSIS — G8929 Other chronic pain: Secondary | ICD-10-CM | POA: Diagnosis not present

## 2023-10-19 DIAGNOSIS — E039 Hypothyroidism, unspecified: Secondary | ICD-10-CM | POA: Diagnosis not present

## 2023-10-19 DIAGNOSIS — F0390 Unspecified dementia without behavioral disturbance: Secondary | ICD-10-CM | POA: Diagnosis not present

## 2023-10-19 DIAGNOSIS — E611 Iron deficiency: Secondary | ICD-10-CM | POA: Diagnosis not present

## 2023-10-19 DIAGNOSIS — R350 Frequency of micturition: Secondary | ICD-10-CM | POA: Diagnosis not present

## 2023-10-19 DIAGNOSIS — M797 Fibromyalgia: Secondary | ICD-10-CM | POA: Diagnosis not present

## 2023-10-19 DIAGNOSIS — I10 Essential (primary) hypertension: Secondary | ICD-10-CM | POA: Diagnosis not present

## 2023-10-19 DIAGNOSIS — Z9682 Presence of neurostimulator: Secondary | ICD-10-CM | POA: Diagnosis not present

## 2023-10-19 DIAGNOSIS — M5126 Other intervertebral disc displacement, lumbar region: Secondary | ICD-10-CM | POA: Diagnosis not present

## 2023-10-19 DIAGNOSIS — M549 Dorsalgia, unspecified: Secondary | ICD-10-CM | POA: Diagnosis not present

## 2023-10-19 DIAGNOSIS — N390 Urinary tract infection, site not specified: Secondary | ICD-10-CM | POA: Diagnosis not present

## 2023-10-19 DIAGNOSIS — K219 Gastro-esophageal reflux disease without esophagitis: Secondary | ICD-10-CM | POA: Diagnosis not present

## 2023-10-19 DIAGNOSIS — M5459 Other low back pain: Secondary | ICD-10-CM | POA: Diagnosis not present

## 2023-10-19 DIAGNOSIS — E785 Hyperlipidemia, unspecified: Secondary | ICD-10-CM | POA: Diagnosis not present

## 2023-10-19 DIAGNOSIS — Z9013 Acquired absence of bilateral breasts and nipples: Secondary | ICD-10-CM | POA: Diagnosis not present

## 2023-10-19 DIAGNOSIS — R262 Difficulty in walking, not elsewhere classified: Secondary | ICD-10-CM | POA: Diagnosis not present

## 2023-10-19 DIAGNOSIS — B9620 Unspecified Escherichia coli [E. coli] as the cause of diseases classified elsewhere: Secondary | ICD-10-CM | POA: Diagnosis not present

## 2023-10-19 DIAGNOSIS — Z79899 Other long term (current) drug therapy: Secondary | ICD-10-CM | POA: Diagnosis not present

## 2023-10-19 DIAGNOSIS — Z7984 Long term (current) use of oral hypoglycemic drugs: Secondary | ICD-10-CM | POA: Diagnosis not present

## 2023-10-19 DIAGNOSIS — M5136 Other intervertebral disc degeneration, lumbar region with discogenic back pain only: Secondary | ICD-10-CM | POA: Diagnosis not present

## 2023-10-19 DIAGNOSIS — I774 Celiac artery compression syndrome: Secondary | ICD-10-CM | POA: Diagnosis not present

## 2023-10-19 DIAGNOSIS — Z794 Long term (current) use of insulin: Secondary | ICD-10-CM | POA: Diagnosis not present

## 2023-10-19 LAB — CBC
HCT: 32.2 % — ABNORMAL LOW (ref 36.0–46.0)
Hemoglobin: 9.9 g/dL — ABNORMAL LOW (ref 12.0–15.0)
MCH: 30.5 pg (ref 26.0–34.0)
MCHC: 30.7 g/dL (ref 30.0–36.0)
MCV: 99.1 fL (ref 80.0–100.0)
Platelets: 244 K/uL (ref 150–400)
RBC: 3.25 MIL/uL — ABNORMAL LOW (ref 3.87–5.11)
RDW: 16.1 % — ABNORMAL HIGH (ref 11.5–15.5)
WBC: 7.5 K/uL (ref 4.0–10.5)
nRBC: 0 % (ref 0.0–0.2)

## 2023-10-19 LAB — PROTIME-INR
INR: 1 (ref 0.8–1.2)
Prothrombin Time: 14.1 s (ref 11.4–15.2)

## 2023-10-19 LAB — BASIC METABOLIC PANEL WITH GFR
Anion gap: 14 (ref 5–15)
BUN: 28 mg/dL — ABNORMAL HIGH (ref 8–23)
CO2: 21 mmol/L — ABNORMAL LOW (ref 22–32)
Calcium: 9.7 mg/dL (ref 8.9–10.3)
Chloride: 103 mmol/L (ref 98–111)
Creatinine, Ser: 0.8 mg/dL (ref 0.44–1.00)
GFR, Estimated: >60 mL/min (ref >60)
Glucose, Bld: 196 mg/dL — ABNORMAL HIGH (ref 70–99)
Potassium: 4.6 mmol/L (ref 3.5–5.1)
Sodium: 137 mmol/L (ref 135–145)

## 2023-10-19 LAB — HEMOGLOBIN A1C
Hgb A1c MFr Bld: 7.8 % — ABNORMAL HIGH (ref 4.8–5.6)
Mean Plasma Glucose: 177.16 mg/dL

## 2023-10-19 LAB — CBG MONITORING, ED: Glucose-Capillary: 198 mg/dL — ABNORMAL HIGH (ref 70–99)

## 2023-10-19 LAB — GLUCOSE, CAPILLARY
Glucose-Capillary: 200 mg/dL — ABNORMAL HIGH (ref 70–99)
Glucose-Capillary: 264 mg/dL — ABNORMAL HIGH (ref 70–99)
Glucose-Capillary: 171 mg/dL — ABNORMAL HIGH (ref 70–99)
Glucose-Capillary: 242 mg/dL — ABNORMAL HIGH (ref 70–99)

## 2023-10-19 LAB — CREATININE, SERUM
Creatinine, Ser: 0.78 mg/dL (ref 0.44–1.00)
GFR, Estimated: >60 mL/min (ref >60)

## 2023-10-19 MED ORDER — DONEPEZIL HCL 10 MG PO TABS
10.0000 mg | ORAL_TABLET | Freq: Every day | ORAL | Status: DC
  Administered 2023-10-19 – 2023-10-23 (×5): 10 mg via ORAL
  Filled 2023-10-19 (×5): qty 1

## 2023-10-19 MED ORDER — EMPAGLIFLOZIN 10 MG PO TABS
10.0000 mg | ORAL_TABLET | Freq: Every day | ORAL | Status: DC
  Administered 2023-10-19: 10 mg via ORAL
  Filled 2023-10-19: qty 1

## 2023-10-19 MED ORDER — IOHEXOL 350 MG/ML SOLN
100.0000 mL | Freq: Once | INTRAVENOUS | Status: AC | PRN
  Administered 2023-10-19: 100 mL via INTRAVENOUS

## 2023-10-19 MED ORDER — ACETAMINOPHEN 500 MG PO TABS
1000.0000 mg | ORAL_TABLET | Freq: Four times a day (QID) | ORAL | Status: DC
  Administered 2023-10-19 – 2023-10-21 (×7): 1000 mg via ORAL
  Filled 2023-10-19 (×8): qty 2

## 2023-10-19 MED ORDER — METFORMIN HCL 500 MG PO TABS
1000.0000 mg | ORAL_TABLET | Freq: Two times a day (BID) | ORAL | Status: DC
Start: 2023-10-19 — End: 2023-10-24
  Administered 2023-10-19 – 2023-10-24 (×11): 1000 mg via ORAL
  Filled 2023-10-19 (×11): qty 2

## 2023-10-19 MED ORDER — ONDANSETRON HCL 4 MG/2ML IJ SOLN
4.0000 mg | Freq: Four times a day (QID) | INTRAMUSCULAR | Status: DC | PRN
  Administered 2023-10-20 – 2023-10-21 (×2): 4 mg via INTRAVENOUS
  Filled 2023-10-19 (×2): qty 2

## 2023-10-19 MED ORDER — HEPARIN SODIUM (PORCINE) 5000 UNIT/ML IJ SOLN
5000.0000 [IU] | Freq: Three times a day (TID) | INTRAMUSCULAR | Status: DC
  Administered 2023-10-19 – 2023-10-24 (×17): 5000 [IU] via SUBCUTANEOUS
  Filled 2023-10-19 (×17): qty 1

## 2023-10-19 MED ORDER — PANTOPRAZOLE SODIUM 40 MG PO TBEC
40.0000 mg | DELAYED_RELEASE_TABLET | Freq: Every day | ORAL | Status: DC
  Administered 2023-10-19 – 2023-10-24 (×6): 40 mg via ORAL
  Filled 2023-10-19 (×6): qty 1

## 2023-10-19 MED ORDER — FENTANYL CITRATE PF 50 MCG/ML IJ SOSY
50.0000 ug | PREFILLED_SYRINGE | Freq: Once | INTRAMUSCULAR | Status: AC
  Administered 2023-10-19: 50 ug via INTRAVENOUS
  Filled 2023-10-19: qty 1

## 2023-10-19 MED ORDER — VITAMIN D 25 MCG (1000 UNIT) PO TABS
1000.0000 [IU] | ORAL_TABLET | ORAL | Status: DC
  Administered 2023-10-19: 1000 [IU] via ORAL
  Filled 2023-10-19 (×2): qty 1

## 2023-10-19 MED ORDER — CEPHALEXIN 500 MG PO CAPS
500.0000 mg | ORAL_CAPSULE | Freq: Four times a day (QID) | ORAL | Status: AC
  Administered 2023-10-19 – 2023-10-23 (×20): 500 mg via ORAL
  Filled 2023-10-19 (×20): qty 1

## 2023-10-19 MED ORDER — MEMANTINE HCL 10 MG PO TABS
10.0000 mg | ORAL_TABLET | Freq: Two times a day (BID) | ORAL | Status: DC
Start: 2023-10-19 — End: 2023-10-24
  Administered 2023-10-19 – 2023-10-24 (×11): 10 mg via ORAL
  Filled 2023-10-19 (×11): qty 1

## 2023-10-19 MED ORDER — INSULIN ASPART 100 UNIT/ML IJ SOLN
0.0000 [IU] | Freq: Three times a day (TID) | INTRAMUSCULAR | Status: DC
  Administered 2023-10-19: 3 [IU] via SUBCUTANEOUS
  Administered 2023-10-19 (×2): 1 [IU] via SUBCUTANEOUS
  Administered 2023-10-20: 2 [IU] via SUBCUTANEOUS
  Administered 2023-10-20: 1 [IU] via SUBCUTANEOUS
  Administered 2023-10-21: 2 [IU] via SUBCUTANEOUS
  Administered 2023-10-21: 1 [IU] via SUBCUTANEOUS
  Administered 2023-10-22 – 2023-10-23 (×2): 2 [IU] via SUBCUTANEOUS
  Administered 2023-10-24: 1 [IU] via SUBCUTANEOUS
  Filled 2023-10-19: qty 0.06

## 2023-10-19 MED ORDER — LIDOCAINE 5 % EX PTCH
1.0000 | MEDICATED_PATCH | CUTANEOUS | Status: DC
  Administered 2023-10-19 – 2023-10-23 (×6): 1 via TRANSDERMAL
  Filled 2023-10-19 (×6): qty 1

## 2023-10-19 MED ORDER — TRAMADOL HCL 50 MG PO TABS
50.0000 mg | ORAL_TABLET | Freq: Three times a day (TID) | ORAL | Status: DC | PRN
  Administered 2023-10-19 – 2023-10-24 (×8): 50 mg via ORAL
  Filled 2023-10-19 (×8): qty 1

## 2023-10-19 MED ORDER — DEXAMETHASONE SODIUM PHOSPHATE 10 MG/ML IJ SOLN
10.0000 mg | Freq: Once | INTRAMUSCULAR | Status: AC
Start: 2023-10-19 — End: 2023-10-19
  Administered 2023-10-19: 10 mg via INTRAVENOUS
  Filled 2023-10-19: qty 1

## 2023-10-19 MED ORDER — ACETAMINOPHEN 10 MG/ML IV SOLN
1000.0000 mg | Freq: Once | INTRAVENOUS | Status: AC
  Administered 2023-10-19: 1000 mg via INTRAVENOUS
  Filled 2023-10-19: qty 100

## 2023-10-19 MED ORDER — LEVOTHYROXINE SODIUM 75 MCG PO TABS
75.0000 ug | ORAL_TABLET | Freq: Every day | ORAL | Status: DC
  Administered 2023-10-19 – 2023-10-24 (×6): 75 ug via ORAL
  Filled 2023-10-19 (×6): qty 1

## 2023-10-19 MED ORDER — METHOCARBAMOL 1000 MG/10ML IJ SOLN: 500.0000 mg | Freq: Three times a day (TID) | INTRAMUSCULAR | Status: DC | PRN

## 2023-10-19 MED ORDER — METHOCARBAMOL 500 MG PO TABS
500.0000 mg | ORAL_TABLET | Freq: Three times a day (TID) | ORAL | Status: DC
  Administered 2023-10-19 – 2023-10-24 (×15): 500 mg via ORAL
  Filled 2023-10-19 (×15): qty 1

## 2023-10-19 MED ORDER — ONDANSETRON HCL 4 MG PO TABS: 4.0000 mg | ORAL_TABLET | Freq: Four times a day (QID) | ORAL | Status: DC | PRN

## 2023-10-19 MED ORDER — OXYCODONE-ACETAMINOPHEN 5-325 MG PO TABS
1.0000 | ORAL_TABLET | Freq: Four times a day (QID) | ORAL | Status: DC | PRN
  Administered 2023-10-19 – 2023-10-24 (×11): 1 via ORAL
  Filled 2023-10-19 (×11): qty 1

## 2023-10-19 MED ORDER — FENTANYL CITRATE PF 50 MCG/ML IJ SOSY
12.5000 ug | PREFILLED_SYRINGE | INTRAMUSCULAR | Status: DC | PRN
  Administered 2023-10-19 – 2023-10-23 (×7): 50 ug via INTRAVENOUS
  Filled 2023-10-19 (×7): qty 1

## 2023-10-19 MED ORDER — HYDRALAZINE HCL 20 MG/ML IJ SOLN: 10.0000 mg | INTRAMUSCULAR | Status: DC | PRN

## 2023-10-19 MED ORDER — ALBUTEROL SULFATE (2.5 MG/3ML) 0.083% IN NEBU: 2.5000 mg | INHALATION_SOLUTION | RESPIRATORY_TRACT | Status: DC | PRN

## 2023-10-19 MED ORDER — ESCITALOPRAM OXALATE 20 MG PO TABS
10.0000 mg | ORAL_TABLET | Freq: Every day | ORAL | Status: DC
  Administered 2023-10-19 – 2023-10-24 (×6): 10 mg via ORAL
  Filled 2023-10-19 (×6): qty 1

## 2023-10-19 MED ORDER — INSULIN GLARGINE 100 UNIT/ML ~~LOC~~ SOLN
35.0000 [IU] | Freq: Every day | SUBCUTANEOUS | Status: DC
  Administered 2023-10-19 – 2023-10-24 (×6): 35 [IU] via SUBCUTANEOUS
  Filled 2023-10-19 (×6): qty 0.35

## 2023-10-19 NOTE — TOC Initial Note (Signed)
 Transition of Care [facility]) - Initial/Assessment Note    Patient Details  Name: Jennifer Boyer MRN: [id number] Date of Birth: [birth year]  [facility]) CM/SW Contact:    Sheri ONEIDA Sharps, LCSW Phone Number: 10/19/2023, 1:55 PM  Clinical Narrative:                 Pt from home alone. Pt continues medical workup. Pt recommended for HHPT. HHPT setup w/ [facility]. IPCM following for dc needs.  Expected Discharge Plan: Home w Home Health Services Barriers to Discharge: Continued Medical Work up   Patient Goals and CMS Choice Patient states their goals for this hospitalization and ongoing recovery are:: return home CMS Medicare.gov Compare Post Acute Care list provided to:: Patient Choice offered to / list presented to : Patient      Expected Discharge Plan and Services In-house Referral: NA Discharge Planning Services: NA Post Acute Care Choice: Home Health Living arrangements for the past 2 months: Single Family Home                 DME Arranged: N/A DME Agency: NA       HH Arranged: PT HH Agency: [facility] Date HH Agency Contacted: 10/19/23 Time HH Agency Contacted: 1354 Representative spoke with at [facility] Agency: Jon  Prior Living Arrangements/Services Living arrangements for the past 2 months: Single Family Home Lives with:: Self Patient language and need for interpreter reviewed:: Yes Do you feel safe going back to the place where you live?: Yes      Need for Family Participation in Patient Care: Yes (Comment) Care giver support system in place?: Yes (comment)   Criminal Activity/Legal Involvement Pertinent to Current Situation/Hospitalization: No - Comment as needed  Activities of Daily Living   ADL Screening (condition at time of admission) Independently performs ADLs?: No Does the patient have a NEW difficulty with bathing/dressing/toileting/self-feeding that is expected to last >3 days?: No Does the patient have a NEW difficulty  with getting in/out of bed, walking, or climbing stairs that is expected to last >3 days?: No Does the patient have a NEW difficulty with communication that is expected to last >3 days?: No Is the patient deaf or have difficulty hearing?: No Does the patient have difficulty seeing, even when wearing glasses/contacts?: No Does the patient have difficulty concentrating, remembering, or making decisions?: No  Permission Sought/Granted                  Emotional Assessment Appearance:: Appears stated age Attitude/Demeanor/Rapport: Engaged Affect (typically observed): Accepting Orientation: : Oriented to Situation, Oriented to  Time, Oriented to Place, Oriented to Self Alcohol / Substance Use: Not Applicable Psych Involvement: No (comment)  Admission diagnosis:  Intractable back pain [M54.9] Intractable low back pain [M54.59] Acute bilateral low back pain without sciatica [M54.50] Patient Active Problem List   Diagnosis Date Noted   Intractable low back pain 10/19/2023   DKA, type 2 (HCC) 07/29/2021   Macrocytic anemia 07/29/2021   Hyperkalemia 07/29/2021   AKI (acute kidney injury) 07/29/2021   HTN (hypertension) 07/29/2021   Hypothyroidism 07/29/2021   HLD (hyperlipidemia) 07/29/2021   HNP (herniated nucleus pulposus), lumbar 03/24/2017   PCP:  Dayna Motto, DO Pharmacy:   [facility] [address], [place] - [address] [place] [address] Phone: [phone] Fax: [phone]  [id number] - [place], [place] - [address] AT [facility] OF [address] [place] [address] Phone: [phone] Fax: [phone]     Social Drivers of Health (SDOH) Social History: SDOH Screenings   Food Insecurity: No Food Insecurity (10/19/2023)  Housing: Unknown (10/19/2023)  Transportation Needs: No Transportation Needs (10/19/2023)  Utilities: Not At Risk (10/19/2023)  Social Connections:  Moderately Isolated (10/19/2023)  Tobacco Use: Low Risk  (10/19/2023)   SDOH Interventions:     Readmission Risk Interventions    10/19/2023    1:53 PM  Readmission Risk Prevention Plan  Transportation Screening Complete  PCP or Specialist Appt within 5-7 Days Complete  Home Care Screening Complete  Medication Review (RN CM) Complete

## 2023-10-19 NOTE — ED Notes (Signed)
 Daughter Sherlean called and given an update.

## 2023-10-19 NOTE — Evaluation (Signed)
 Physical Therapy Evaluation Patient Details Name: Jennifer Boyer MRN: [id number] DOB: 1940/08/21 Today's Date: 10/19/2023  History of Present Illness  83 yo female presents to therapy following hospital admission on 10/18/2023 due to back pain > 1 wk. Pt was dx with UTI 9/25 and started ABX however pain continued, increased urinary frequency and pt now having difficulty walking, pt returned to ED. No acute findings on imaging and labs grossly unremarkable. Pt PMH includes but is not limited to: fibromyalgia, DM II, HTN, GERD,  dementia, lumbar herniation, DJD, and R sacral stimulator.  Clinical Impression    Pt admitted with above diagnosis.  Pt currently with functional limitations due to the deficits listed below (see PT Problem List). Pt in bed when PT arrived. Pt agreeable to therapy intervention. Pt indicated 6/10 back pain at rest. MD present for a portion of PT eval and daughter entered room toward end of evaluation. Pt required min A for supine to sit, min A for sit to stand  from EOB and with transfer tasks- commode and recliner specific cues for RW and UE placement, gait tasks 15 and 12 feet with RW, CGA and cues. Pt left seated in recliner, all needs in place, nurse aware of pt increased pain with mobility and daughter present. Pt will benefit from acute skilled PT to increase their independence and safety with mobility to allow discharge.         If plan is discharge home, recommend the following: A little help with walking and/or transfers;A little help with bathing/dressing/bathroom;Assistance with cooking/housework;Assist for transportation   Can travel by private vehicle        Equipment Recommendations None recommended by PT  Recommendations for Other Services       Functional Status Assessment Patient has had a recent decline in their functional status and demonstrates the ability to make significant improvements in function in a reasonable and predictable amount of time.      Precautions / Restrictions Precautions Precautions: Fall Restrictions Weight Bearing Restrictions Per Provider Order: No      Mobility  Bed Mobility Overal bed mobility: Needs Assistance Bed Mobility: Supine to Sit     Supine to sit: Min assist, HOB elevated, Used rails     General bed mobility comments: cues and increased time, use of bed pad to slide L hip to EOB    Transfers Overall transfer level: Needs assistance Equipment used: Rolling walker (2 wheels) Transfers: Sit to/from Stand Sit to Stand: Min assist           General transfer comment: cues for proper UE and AD placement with difficulty noted with power up secondary to pain and generalized weakness    Ambulation/Gait Ambulation/Gait assistance: Contact guard assist Gait Distance (Feet): 15 Feet Assistive device: Rolling walker (2 wheels) Gait Pattern/deviations: Step-to pattern, Trunk flexed Gait velocity: decreased     General Gait Details: slight trunk flexion with cues for RW management and palcement with approach to sitting surfaces with pt amb from bed to commode and commode to recliner, limited B foot clearance and stride length  Stairs            Wheelchair Mobility     Tilt Bed    Modified Rankin (Stroke Patients Only)       Balance Overall balance assessment: Mild deficits observed, not formally tested (pt denies falls)  Pertinent Vitals/Pain Pain Assessment Pain Assessment: 0-10 Pain Score: 8  (6/10 at rest and pain increasing with mobility) Pain Location: back, specifically L low back region Pain Descriptors / Indicators: Aching, Constant, Discomfort, Grimacing, Guarding Pain Intervention(s): Limited activity within patient's tolerance, Monitored during session, Repositioned, Patient requesting pain meds-RN notified    Home Living Family/patient expects to be discharged to:: Private residence Living  Arrangements: Alone Available Help at Discharge: Family Type of Home: Independent living facility Home Access: Level entry       Home Layout: One level Home Equipment: Agricultural consultant (2 wheels);Cane - single point;Grab bars - tub/shower Additional Comments: pt reports living in ALF but then states no assist with ADLs, medicaiton management or meals, pt then reported living in ILF, daughter arrived during therapy eval did not provide insight to living situation nor PLOF    Prior Function Prior Level of Function : Needs assist             Mobility Comments: pt reports occationally using RW ADLs Comments: pt requires A for IADLs     Extremity/Trunk Assessment        Lower Extremity Assessment Lower Extremity Assessment: Generalized weakness    Cervical / Trunk Assessment Cervical / Trunk Assessment: Back Surgery (scaring evident, pt unable to clarify)  Communication   Communication Communication: No apparent difficulties    Cognition Arousal: Alert Behavior During Therapy: WFL for tasks assessed/performed   PT - Cognitive impairments: History of cognitive impairments                         Following commands: Intact       Cueing       General Comments      Exercises     Assessment/Plan    PT Assessment Patient needs continued PT services  PT Problem List Decreased strength;Decreased activity tolerance;Decreased balance;Decreased mobility;Decreased coordination;Decreased safety awareness;Pain       PT Treatment Interventions DME instruction;Gait training;Functional mobility training;Therapeutic activities;Therapeutic exercise;Balance training;Neuromuscular re-education;Patient/family education    PT Goals (Current goals can be found in the Care Plan section)  Acute Rehab PT Goals Patient Stated Goal: to get rid of the pain PT Goal Formulation: With patient Time For Goal Achievement: 11/02/23 Potential to Achieve Goals: Good    Frequency  Min 3X/week     Co-evaluation               AM-PAC PT 6 Clicks Mobility  Outcome Measure Help needed turning from your back to your side while in a flat bed without using bedrails?: A Little Help needed moving from lying on your back to sitting on the side of a flat bed without using bedrails?: A Little Help needed moving to and from a bed to a chair (including a wheelchair)?: A Little Help needed standing up from a chair using your arms (e.g., wheelchair or bedside chair)?: A Little Help needed to walk in hospital room?: A Little Help needed climbing 3-5 steps with a railing? : Total 6 Click Score: 16    End of Session Equipment Utilized During Treatment: Gait belt Activity Tolerance: Patient limited by pain Patient left: in chair;with call bell/phone within reach;with family/visitor present Nurse Communication: Mobility status;Patient requests pain meds PT Visit Diagnosis: Unsteadiness on feet (R26.81);Other abnormalities of gait and mobility (R26.89);Muscle weakness (generalized) (M62.81);Difficulty in walking, not elsewhere classified (R26.2);Pain Pain - part of body:  (back)    Time: 8947-8882 PT Time Calculation (min) (ACUTE ONLY): 25  min   Charges:   PT Evaluation $PT Eval Low Complexity: 1 Low PT Treatments $Gait Training: 8-22 mins PT General Charges $$ ACUTE PT VISIT: 1 Visit         [place], PT Acute Rehab   [place] Jennifer Boyer 10/19/2023, 12:17 PM

## 2023-10-19 NOTE — H&P (Signed)
 History and Physical    Jennifer Boyer FMW:[id number] DOB: June 20, 1940 DOA: 10/18/2023  PCP: Dayna Motto, DO  Patient coming from: Home, independent living from [place]   I have personally briefly reviewed patient's old medical records available.   Chief Complaint: Back pain, unable to walk.  HPI: Jennifer Boyer is a 83 y.o. female with medical history significant of dementia, fibromyalgia, type 2 diabetes on insulin , hypertension, GERD who was suffering from dysuria and back pain for about a week.  She was seen in the emergency department on 9/25, she was given Keflex .  Urine culture with E. coli.  She went back home, continued to have pain on her back, hard time walking and standing.  She denies any trauma.  Denies any similar back pain issues in the past.  Yesterday, presented back to the emergency room.  Significant difficulty mobilizing and lives in independent.  Admission requested.  Patient is poor historian.  Supplemented by patient's daughter.  Having significant difficulty mobilizing around and complaining of low back pain.  Still has some dysuria. ED Course: Hemodynamically stable. CT scan angiogram chest abdomen pelvis without aneurysm dissection or stenosis. CT scan lumbar spine 9/25 with no acute findings, multilevel degenerative disc disease and facet hypertrophy, history of left hemilaminectomy at L5  Review of Systems: all systems are reviewed and pertinent positive as per HPI otherwise rest are negative.    Past Medical History:  Diagnosis Date   Arthritis    OA   Complication of anesthesia    pt has been told she is hard to intubate.   Dementia (HCC)    Diabetes mellitus without complication (HCC)    Difficult intubation    Fibromyalgia    GERD (gastroesophageal reflux disease)    Headache    HX MIGRAINES  NONE IN LONG TIME   History of hiatal hernia    Hypertension    Hypothyroidism     Past Surgical History:  Procedure Laterality Date   BREAST SURGERY      bilateral mastectomy   CHOLECYSTECTOMY     DILATATION & CURETTAGE/HYSTEROSCOPY WITH TRUECLEAR N/A 09/30/2012   Procedure: DILATATION & CURETTAGE/HYSTEROSCOPY WITH TRUECLEAR;  Surgeon: Lynwood FORBES Clubs II, MD;  Location: [facility];  Service: Gynecology;  Laterality: N/A;   DILATION AND CURETTAGE OF UTERUS     uterine polyp   KNEE SURGERY     LUMBAR LAMINECTOMY/DECOMPRESSION MICRODISCECTOMY Left 03/24/2017   Procedure: LEFT LUMBAR FIVE-SACRAL ONE MICRODISCECTOMY, LEFT LUMBAR FOUR-FIVE DECOMPRESSIVE LAMINECTOMY;  Surgeon: Onetha Kuba, MD;  Location: [facility];  Service: Neurosurgery;  Laterality: Left;  Left L4-5 L5-S1 Laminectomy/Foraminotomy   LUMBAR LAMINECTOMY/DECOMPRESSION MICRODISCECTOMY Left 09/01/2017   Procedure: Microdiscectomy - Lumbar Five-Sacral One - left redo;  Surgeon: Onetha Kuba, MD;  Location: [facility];  Service: Neurosurgery;  Laterality: Left;  left   TONSILLECTOMY      Social history   reports that she has never smoked. She has never used smokeless tobacco. She reports that she does not drink alcohol and does not use drugs.  No Known Allergies  History reviewed. No pertinent family history.   Prior to Admission medications   Medication Sig Start Date End Date Taking? Authorizing Provider  acetaminophen  (TYLENOL ) 500 MG tablet Take 500-1,000 mg by mouth every 6 (six) hours as needed for moderate pain.   Yes [provider]  ADMELOG SOLOSTAR 100 UNIT/ML KwikPen Inject into the skin. 09/14/23  Yes [provider]  cephALEXin  (KEFLEX ) 500 MG capsule Take 1 capsule (500 mg  total) by mouth 4 (four) times daily. 10/16/23  Yes Ula Prentice SAUNDERS, MD  cholecalciferol  (VITAMIN D ) 1000 units tablet Take 1,000 Units by mouth once a week.    Yes [provider]  donepezil  (ARICEPT ) 10 MG tablet Take 1 tablet (10 mg total) by mouth at bedtime. 03/15/23  Yes Penumalli, Vikram R, MD  escitalopram (LEXAPRO) 10 MG tablet Take 10 mg by mouth daily. 09/26/21  Yes [provider]  insulin  aspart (NOVOLOG ) 100 UNIT/ML FlexPen Before each meal 3 times a day, 140-199 - 4 units, 200-250 - 6 units, 251-299 - 8 units,  300-349 - 10 units,  350 or above 12 units. Insulin  PEN if approved, provide syringes and needles if needed. 08/05/21  Yes Singh, Prashant K, MD  Insulin  Glargine (BASAGLAR  KWIKPEN) 100 UNIT/ML Inject 55 Units into the skin daily. Patient taking differently: Inject 35 Units into the skin daily. 08/05/21  Yes Singh, Prashant K, MD  JARDIANCE 10 MG TABS tablet Take 10 mg by mouth daily. 05/21/22  Yes [provider]  levothyroxine  (SYNTHROID , LEVOTHROID) 75 MCG tablet Take 75 mcg by mouth daily before breakfast.   Yes [provider]  memantine  (NAMENDA ) 10 MG tablet Take 1 tablet (10 mg total) by mouth 2 (two) times daily. 03/15/23  Yes Penumalli, Vikram R, MD  metFORMIN  (GLUCOPHAGE ) 1000 MG tablet Take 1,000 mg by mouth 2 (two) times daily with a meal.   Yes [provider]  oxyCODONE -acetaminophen  (PERCOCET/ROXICET) 5-325 MG tablet Take 1 tablet by mouth every 6 (six) hours as needed for severe pain (pain score 7-10). 10/14/23  Yes Mannie Pac T, DO  pantoprazole  (PROTONIX ) 40 MG tablet Take 40 mg by mouth daily.   Yes [provider]  phenazopyridine (PYRIDIUM) 200 MG tablet Take 1 tablet (200 mg total) by mouth 3 (three) times daily. 10/16/23  Yes Ula Prentice SAUNDERS, MD  rosuvastatin  (CRESTOR ) 20 MG tablet Take 20 mg by mouth daily.   Yes [provider]  cephALEXin  (KEFLEX ) 500 MG capsule Take 1 capsule (500 mg total) by mouth 4 (four) times daily. 10/16/23   Ula Prentice SAUNDERS, MD  methylPREDNISolone  (MEDROL  DOSEPAK) 4 MG TBPK tablet Take as instructed by dose packaging 10/14/23   Mannie Pac T, DO  phenazopyridine (PYRIDIUM) 200 MG tablet Take 1 tablet (200 mg total) by mouth 3 (three) times daily. 10/16/23   Ula Prentice SAUNDERS, MD    Physical Exam: Vitals:   10/19/23 0205 10/19/23 0530 10/19/23 0838 10/19/23 1307  BP: (!)  149/95 (!) 141/85 128/67 122/62  Pulse: 100 99 81 81  Resp: 18 17  18   Temp: 98.3 F (36.8 C) 98.3 F (36.8 C) 97.7 F (36.5 C) 98.2 F (36.8 C)  TempSrc: Oral  Oral Oral  SpO2: 96% 95% 97% 95%    Constitutional: NAD, calm, comfortable at rest.  Slightly apprehensive to walk. Vitals:   10/19/23 0205 10/19/23 0530 10/19/23 0838 10/19/23 1307  BP: (!) 149/95 (!) 141/85 128/67 122/62  Pulse: 100 99 81 81  Resp: 18 17  18   Temp: 98.3 F (36.8 C) 98.3 F (36.8 C) 97.7 F (36.5 C) 98.2 F (36.8 C)  TempSrc: Oral  Oral Oral  SpO2: 96% 95% 97% 95%   Eyes: PERRL, lids and conjunctivae normal ENMT: Mucous membranes are moist. Posterior pharynx clear of any exudate or lesions.Normal dentition.  Neck: normal, supple, no masses, no thyromegaly Respiratory: clear to auscultation bilaterally, no wheezing, no crackles. Normal respiratory effort. No accessory muscle  use.  Cardiovascular: Regular rate and rhythm, no murmurs / rubs / gallops. No extremity edema. 2+ pedal pulses. No carotid bruits.  Abdomen: no tenderness, no masses palpated. No hepatosplenomegaly. Bowel sounds positive.  Musculoskeletal: no clubbing / cyanosis. No joint deformity upper and lower extremities. Good ROM, no contractures. Normal muscle tone.  No localized tenderness. Skin: no rashes, lesions, ulcers. No induration Neurologic: CN 2-12 grossly intact. Sensation intact, DTR normal. Strength 5/5 in all 4.  Psychiatric: Normal judgment and insight.  Mostly alert and oriented.  Forgetful.  Moves all extremities equally.    Labs on Admission: I have personally reviewed following labs and imaging studies  CBC: Recent Labs  Lab 10/14/23 1648 10/18/23 1956 10/19/23 0352  WBC 5.4 7.9 7.5  NEUTROABS 2.8  --   --   HGB 9.6* 10.5* 9.9*  HCT 30.7* 35.0* 32.2*  MCV 98.4 100.0 99.1  PLT 205 279 244   Basic Metabolic Panel: Recent Labs  Lab 10/14/23 1648 10/18/23 1956 10/19/23 0352  NA 136 137 137  K 4.3 4.2 4.6   CL 101 102 103  CO2 21* 21* 21*  GLUCOSE 218* 243* 196*  BUN 13 28* 28*  CREATININE 1.00 0.92 0.80  0.78  CALCIUM  9.6 9.8 9.7   GFR: Estimated Creatinine Clearance: 66.2 mL/min (by C-G formula based on SCr of 0.78 mg/dL). Liver Function Tests: Recent Labs  Lab 10/14/23 1648  AST 21  ALT 9  ALKPHOS 72  BILITOT 0.5  PROT 8.4*  ALBUMIN 4.2   No results for input(s): LIPASE, AMYLASE in the last 168 hours. No results for input(s): AMMONIA in the last 168 hours. Coagulation Profile: Recent Labs  Lab 10/19/23 0352  INR 1.0   Cardiac Enzymes: No results for input(s): CKTOTAL, CKMB, CKMBINDEX, TROPONINI in the last 168 hours. BNP (last 3 results) No results for input(s): PROBNP in the last 8760 hours. HbA1C: Recent Labs    10/19/23 0352  HGBA1C 7.8*   CBG: Recent Labs  Lab 10/19/23 0750 10/19/23 0909 10/19/23 1201  GLUCAP 198* 200* 264*   Lipid Profile: No results for input(s): CHOL, HDL, LDLCALC, TRIG, CHOLHDL, LDLDIRECT in the last 72 hours. Thyroid  Function Tests: No results for input(s): TSH, T4TOTAL, FREET4, T3FREE, THYROIDAB in the last 72 hours. Anemia Panel: No results for input(s): VITAMINB12, FOLATE, FERRITIN, TIBC, IRON, RETICCTPCT in the last 72 hours. Urine analysis:    Component Value Date/Time   COLORURINE YELLOW 10/18/2023 2214   APPEARANCEUR CLEAR 10/18/2023 2214   LABSPEC 1.030 10/18/2023 2214   PHURINE 5.0 10/18/2023 2214   GLUCOSEU >=500 (A) 10/18/2023 2214   HGBUR NEGATIVE 10/18/2023 2214   BILIRUBINUR NEGATIVE 10/18/2023 2214   BILIRUBINUR negative 10/14/2023 1437   KETONESUR NEGATIVE 10/18/2023 2214   PROTEINUR NEGATIVE 10/18/2023 2214   UROBILINOGEN 0.2 10/14/2023 1437   NITRITE NEGATIVE 10/18/2023 2214   LEUKOCYTESUR NEGATIVE 10/18/2023 2214    Radiological Exams on Admission: CT Angio Chest/Abd/Pel for Dissection W and/or Wo Contrast Result Date: 10/19/2023 CLINICAL DATA:   Back pain worsening over the past week, diagnosed with UTI and placed on antibiotics 3 days ago with worsening pain and urinary frequency despite antibiotics. EXAM: CT ANGIOGRAPHY CHEST, ABDOMEN AND PELVIS TECHNIQUE: Non-contrast CT of the chest was initially obtained. Multidetector CT imaging through the chest, abdomen and pelvis was performed using the standard protocol during bolus administration of intravenous contrast. Multiplanar reconstructed images and MIPs were obtained and reviewed to evaluate the vascular anatomy. RADIATION DOSE REDUCTION: This exam was performed  according to the departmental dose-optimization program which includes automated exposure control, adjustment of the mA and/or kV according to patient size and/or use of iterative reconstruction technique. CONTRAST:  OMNIPAQUE IOHEXOL 350 MG/ML SOLN COMPARISON:  CT abdomen and pelvis without contrast earlier today, CT abdomen and pelvis without contrast 10/14/2023. FINDINGS: CTA CHEST FINDINGS Cardiovascular: The pulmonary arteries are opacified more than the aorta. The pulmonary trunk is prominent measuring 3.2 cm. No arterial embolism is seen. The pulmonary veins are normal caliber. The cardiac size is normal. There is no pericardial effusion. There are trace scattered single-vessel calcific plaques in the LAD coronary artery. There is aortic atherosclerosis and tortuosity and scattered calcification in the great vessels without aneurysm, stenosis or dissection. Great vessel branching standard. Mediastinum/Nodes: No enlarged mediastinal, hilar, or axillary lymph nodes. The lower poles of the thyroid  gland, trachea the main bronchi are unremarkable. There is moderate thickening at the EG junction. Endoscopic follow-up recommended. There is a moderate-sized hiatal hernia, with a small amount of fluid in the hernia sac to the right. Lungs/Pleura: There is diffuse bronchial thickening. Central airways are clear. There are mild biapical  reticular scarring changes and mild posterior atelectasis in the lungs without consolidation, effusion or suspicious nodule. No pneumothorax. There is mild subpleural reticulation in the right lung base. Musculoskeletal: Osteopenia with multilevel degenerative discs thoracic spine, thoracic spondylosis. Multilevel interbody ankylosis. No acute or other significant osseous findings. There are bilateral breast implants with partially calcified capsules. No chest wall mass is seen. Review of the MIP images confirms the above findings. CTA ABDOMEN AND PELVIS FINDINGS VASCULAR Aorta: Normal in caliber with mild patchy non stenosing calcifications. No aneurysm or dissection. Celiac: There is a 40% stenosis in the proximal vessel due to compression by the median arcuate ligament of the diaphragm. The remainder is widely patent. SMA: Patent without evidence of aneurysm, dissection, vasculitis or significant stenosis. There are nonstenosing ostial calcific plaques. Renals: Both renal arteries are patent without evidence of aneurysm, dissection, vasculitis, fibromuscular dysplasia or significant stenosis. Both demonstrate nonstenosing ostial calcific plaques. IMA: Patent without evidence of aneurysm, dissection, vasculitis or significant stenosis. Inflow: Patent without evidence of aneurysm, dissection, vasculitis or significant stenosis. There are scattered calcific plaques in both common iliac and both internal iliac arteries. The external iliac arteries are plaque free. Veins: No obvious venous abnormality within the limitations of this arterial phase study. Review of the MIP images confirms the above findings. NON-VASCULAR Hepatobiliary: There is a calcified granuloma in the dome of the right lobe. The liver is 20 cm length with mild steatosis without mass enhancement. Gallbladder is absent without significant biliary prominence. Pancreas: Partially atrophic.  No other focal abnormality. Spleen: No abnormality.  No  splenomegaly. Adrenals/Urinary Tract: There is no adrenal mass. There are no renal mass enhancement. Perinephric fat stranding is unchanged. There is no urinary stone or obstruction. There is faint perivesical stranding which may be seen with cystitis. There is mild bladder thickening versus underdistention. Stomach/Bowel: There are thickened folds in the stomach. No dilatation or wall thickening of the bowel. An appendix is not seen. Moderate retained stool noted ascending and proximal transverse colon. Descending and sigmoid diverticulosis again is noted without diverticulitis. Lymphatic: No lymphadenopathy is seen. Reproductive: Uterus and bilateral adnexa are unremarkable. Other: Small umbilical fat hernia. No incarcerated hernia. No free fluid or free air. Musculoskeletal: Osteopenia, multilevel degenerative changes. There is concern for a lytic lesion of the anterior superior L1 vertebral body to the right measuring 1.9 cm.  This would be an atypical location for a Schmorl's node. This was not seen on the lumbar MRI from 08/01/2017. Lumbar spine MRI recommended without and with contrast. Left hemilaminectomy at L5 is again seen. There are degenerative changes of the hips. Right-sided sacral stimulator wiring noted with right flank implanted power source. Review of the MIP images confirms the above findings. IMPRESSION: 1. Aortic and branch vessel atherosclerosis without aneurysm, dissection or stenosis. 2. Prominent pulmonary trunk but no arterial embolism is seen. 3. Hiatal hernia with moderate thickening at the EG junction. Endoscopic follow-up recommended. 4. Bronchitis. 5. Likely gastritis. 6. Cystitis versus bladder nondistention. 7. Constipation and diverticulosis. 8. 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 9. 1.9 cm lytic lesion suspected of the anterior superior L1 vertebral body to the right. This would be an atypical location for a  Schmorl's node. This was not seen on the lumbar MRI from 08/01/2017. Lumbar spine MRI recommended without and with contrast. 10. Osteopenia and degenerative change. Aortic Atherosclerosis (ICD10-I70.0). Electronically Signed   By: Francis Quam M.D.   On: 10/19/2023 01:25   CT L-SPINE NO CHARGE Result Date: 10/18/2023 EXAM: CT OF THE LUMBAR SPINE WITHOUT CONTRAST 10/18/2023 10:56:32 PM TECHNIQUE: CT of the lumbar spine was performed without the administration of intravenous contrast. Multiplanar reformatted images are provided for review. Automated exposure control, iterative reconstruction, and/or weight based adjustment of the mA/kV was utilized to reduce the radiation dose to as low as reasonably achievable. COMPARISON: Lumbar spine CT 4 days ago 10/14/2023 CLINICAL HISTORY: PT states she has been having back pain for over a week, was diagnosed with a possible UTI put on abx on Saturday but the pain has gotten worse and has started having urinary frequency and has started to be incontinent. FINDINGS: BONES AND ALIGNMENT: Normal vertebral body heights. No acute fracture or suspicious bone lesion. Normal alignment. Left hemilaminectomy at L5. Presacral stimulator on the right. DEGENERATIVE CHANGES: Stable multilevel degenerative disc disease and facet hypertrophy with diffuse broad-based disc bulge. SOFT TISSUES: No acute abnormality. IMPRESSION: 1. No acute findings or change from CT 4 days ago. 2. Multilevel degenerative disc disease and facet hypertrophy. 3. Left hemilaminectomy at L5. Electronically signed by: Andrea Gasman MD 10/18/2023 11:17 PM EDT RP Workstation: [id number]   CT Renal Stone Study Result Date: 10/18/2023 EXAM: CT UROGRAM 10/18/2023 10:56:32 PM TECHNIQUE: CT of the abdomen and pelvis was performed without the administration of intravenous contrast. Multiplanar reformatted images as well as MIP urogram images are provided for review. Automated exposure control, iterative  reconstruction, and/or weight based adjustment of the mA/kV was utilized to reduce the radiation dose to as low as reasonably achievable. COMPARISON: CT 10/14/2023, 4 days ago. CLINICAL HISTORY: Abdominal/flank pain, stone suspected. Triage Notes: PT states she has been having back pain for over a week, was diagnosed with a possible UTI put on abx on Saturday but the pain has gotten worse and has started having urinary frequency and has started to be incontinent. FINDINGS: LOWER CHEST: Small hiatal hernia. LIVER: Punctate hepatic granuloma. GALLBLADDER AND BILE DUCTS: Cholecystectomy. No biliary ductal dilatation. SPLEEN: No acute abnormality. PANCREAS: Pancreatic parenchymal atrophy. No ductal dilatation or inflammation. ADRENAL GLANDS: No acute abnormality. KIDNEYS, URETERS AND BLADDER: No renal stones or hydronephrosis. No evidence of renal inflammation. Urinary bladder is minimally distended, normal for degree of extension. GI AND BOWEL: Small hiatal hernia. Colonic diverticulosis without diverticulitis. Moderate colonic stool burden. Fecalization of distal  small bowel contents. There is no bowel obstruction. PERITONEUM AND RETROPERITONEUM: No ascites. No free air. VASCULATURE: Aorta is normal in caliber. Aortic atherosclerosis without aneurysm. LYMPH NODES: No lymphadenopathy. REPRODUCTIVE ORGANS: No acute abnormality. BONES AND SOFT TISSUES: Bilateral breast implants. Small fat-containing umbilical hernia. No acute osseous abnormality. Lumbar spine reported separately on lumbar spine reformats. No focal soft tissue abnormality. IMPRESSION: 1. No renal stones or hydronephrosis. No acute intra-abdominal or pelvic abnormality. 2. Diverticulosis without diverticulitis. 3. Aortic atherosclerosis (ICD10-170.0) Electronically signed by: Andrea Gasman MD 10/18/2023 11:14 PM EDT RP Workstation: [id number]     Assessment/Plan Principal Problem:   Intractable low back pain Active Problems:   DKA, type 2 (HCC)    Hypothyroidism   HTN (hypertension)     1.  Intractable back pain, acute on chronic back pain: Patient currently without any new neurological deficits.  Unsafe to discharge, unable to mobilize. Pain regimen Tylenol  1 g every 6 hours scheduled Robaxin 500 mg every 8 hours scheduled Lidocaine  patch Oxycodone  for moderate pain, fentanyl  for severe pain. Multimodal pain regimen.  Mobility with PT OT.  May need rehab.  2.  Acute UTI: Currently on treatment.  Pansensitive E. coli.  Continue Keflex  for total 7 days.  3.  Chronic medical issues including Hypothyroidism, resume Synthroid  Type 2 diabetes, resume metformin  and insulin .  Hold Jardiance for acute UTI. Hyperlipidemia, on statin.  Continue. GERD, on PPI.  Continue. Dementia, delirium precautions.  Fall precautions.  Continue Namenda .  DVT prophylaxis: Heparin subcu Code Status: Full code Family Communication: Daughter at the bedside Disposition Plan: Possibly SNF Consults called: None Admission status: Inpatient.  MedSurg bed.   Renato Applebaum MD [facility]

## 2023-10-20 DIAGNOSIS — M5459 Other low back pain: Secondary | ICD-10-CM | POA: Diagnosis not present

## 2023-10-20 LAB — GLUCOSE, CAPILLARY
Glucose-Capillary: 129 mg/dL — ABNORMAL HIGH (ref 70–99)
Glucose-Capillary: 155 mg/dL — ABNORMAL HIGH (ref 70–99)
Glucose-Capillary: 201 mg/dL — ABNORMAL HIGH (ref 70–99)
Glucose-Capillary: 201 mg/dL — ABNORMAL HIGH (ref 70–99)

## 2023-10-20 NOTE — Evaluation (Signed)
 Occupational Therapy Evaluation Patient Details Name: Jennifer Boyer MRN: [id number] DOB: Jun 05, 1940 Today's Date: 10/20/2023   History of Present Illness   83 yo female presents to therapy following hospital admission on 10/18/2023 due to back pain > 1 wk. Pt was dx with UTI 9/25 and started ABX however pain continued, increased urinary frequency and pt now having difficulty walking, pt returned to ED. No acute findings on imaging and labs grossly unremarkable. Pt PMH includes but is not limited to: fibromyalgia, DM II, HTN, GERD,  dementia, lumbar herniation, DJD, and R sacral stimulator.     Clinical Impressions PTA, patient lives alone in ILF with family locally but patient unable to elaborate on exact level of assist needed however was using RW on occasion and relatively independent with BADL's. Currently, patient presents with deficits outlined below (see OT Problem List for details) most significantly pain, decreased cognition, activity tolerance, balance and generalized muscle weakness limiting BADL's (total/max LB) and functional mobility (max A BSC trial).    Patient requires continued Acute care hospital level OT services to progress safety and functional performance and allow for discharge. Patient will benefit from continued inpatient follow up therapy, <3 hours/day.        If plan is discharge home, recommend the following:   Two people to help with walking and/or transfers;A lot of help with bathing/dressing/bathroom;Assistance with cooking/housework;Direct supervision/assist for medications management;Direct supervision/assist for financial management;Assist for transportation;Help with stairs or ramp for entrance;Supervision due to cognitive status     Functional Status Assessment   Patient has had a recent decline in their functional status and demonstrates the ability to make significant improvements in function in a reasonable and predictable amount of time.      Equipment Recommendations   BSC/3in1       Precautions/Restrictions   Precautions Precautions: Fall Recall of Precautions/Restrictions: Impaired Restrictions Weight Bearing Restrictions Per Provider Order: No     Mobility Bed Mobility Overal bed mobility: Needs Assistance Bed Mobility: Sidelying to Sit, Sit to Sidelying   Sidelying to sit: Max assist, HOB elevated, Used rails     Sit to sidelying: Max assist, HOB elevated, Used rails General bed mobility comments: cues, increased time and max A for Le management, cues for log rolling and pain mngt strategies    Transfers Overall transfer level: Needs assistance Equipment used: Rolling walker (2 wheels) Transfers: Sit to/from Stand Sit to Stand: Max assist           General transfer comment: attempted BSC, pain to great to trial      Balance Overall balance assessment: Needs assistance Sitting-balance support: Single extremity supported, Feet supported Sitting balance-Leahy Scale: Fair   Postural control: Other (comment) (forward lean for pain management) Standing balance support: Reliant on assistive device for balance, Bilateral upper extremity supported Standing balance-Leahy Scale: Poor                             ADL either performed or assessed with clinical judgement   ADL Overall ADL's : Needs assistance/impaired Eating/Feeding: Set up;Bed level   Grooming: Wash/dry hands;Wash/dry face;Sitting;Minimal assistance   Upper Body Bathing: Moderate assistance;Sitting   Lower Body Bathing: Maximal assistance;Total assistance;Bed level   Upper Body Dressing : Moderate assistance;Sitting   Lower Body Dressing: Maximal assistance;Total assistance;Bed level   Toilet Transfer:  (attempted commode transfer, limited by pain)   Toileting- Clothing Manipulation and Hygiene: Maximal assistance;Bed level Toileting - Clothing Manipulation Details (  indicate cue type and reason): bed pan bed  level     Functional mobility during ADLs: Maximal assistance;Rolling walker (2 wheels) General ADL Comments: pain interferes with all BADL's, cues for initiation     Vision Baseline Vision/History: 1 Wears glasses;0 No visual deficits Ability to See in Adequate Light: 0 Adequate Patient Visual Report: No change from baseline              Pertinent Vitals/Pain Pain Assessment Pain Assessment: 0-10 Pain Score: 10-Worst pain ever (reported 0/10 pain thus proceeded with tx and 10/10 with any movement) Breathing: occasional labored breathing, short period of hyperventilation Negative Vocalization: occasional moan/groan, low speech, negative/disapproving quality Facial Expression: facial grimacing Body Language: tense, distressed pacing, fidgeting Consolability: no need to console PAINAD Score: 5 Pain Location: back, specifically L low back region Pain Descriptors / Indicators: Aching, Constant, Discomfort, Grimacing, Guarding Pain Intervention(s): Limited activity within patient's tolerance, Monitored during session, Repositioned, Patient requesting pain meds-RN notified, RN gave pain meds during session, Relaxation, Other (comment) (ice offered and declined, did have pain patch in place)     Extremity/Trunk Assessment Upper Extremity Assessment Upper Extremity Assessment: Right hand dominant;Generalized weakness   Lower Extremity Assessment Lower Extremity Assessment: Defer to PT evaluation   Cervical / Trunk Assessment Cervical / Trunk Assessment: Back Surgery (sscaring evident but patient unable to elaborate)   Communication Communication Communication: No apparent difficulties   Cognition Arousal: Alert Behavior During Therapy: Flat affect Cognition: Cognition impaired   Orientation impairments: Situation Awareness: Intellectual awareness impaired, Online awareness impaired Memory impairment (select all impairments): Working [employer] fast streamer, Administrator, sports Attention impairment (select first level of impairment): Sustained attention Executive functioning impairment (select all impairments): Organization, Sequencing, Reasoning, Problem solving, Initiation OT - Cognition Comments: poor initiation of daily care, did not realize breakfast had not been ordered or need to reposition in bed and ask for pain meds, limitied insight, processing and judgement                 Following commands: Intact       Cueing  General Comments   Cueing Techniques: Verbal cues;Gestural cues  low awareness of need for position change or body scan prior to trial upright, no skin issues or edema, SCD's replaced           Home Living Family/patient expects to be discharged to:: Private residence Living Arrangements: Alone Available Help at Discharge: Family Type of Home: Independent living facility Home Access: Level entry     Home Layout: One level     Bathroom Shower/Tub: Producer, television/film/video: Handicapped height Bathroom Accessibility: Yes How Accessible: Accessible via wheelchair Home Equipment: Agricultural consultant (2 wheels);Cane - single point;Shower seat;Grab bars - tub/shower   Additional Comments: ILF facility but unable to state exact plof due to recall issues      Prior Functioning/Environment Prior Level of Function : Needs assist             Mobility Comments: pt reports occationally using RW ADLs Comments: pt requires A for IADLs    OT Problem List: Decreased strength;Decreased activity tolerance;Impaired balance (sitting and/or standing);Decreased cognition;Decreased safety awareness;Decreased knowledge of use of DME or AE;Decreased knowledge of precautions;Obesity;Pain   OT Treatment/Interventions: Self-care/ADL training;Therapeutic exercise;Neuromuscular education;Energy conservation;DME and/or AE instruction;Therapeutic activities;Cognitive remediation/compensation;Patient/family education;Balance training       OT Goals(Current goals can be found in the care plan section)   Acute Rehab OT Goals Patient Stated Goal: to feel better OT Goal Formulation:  With patient Time For Goal Achievement: 11/03/23 Potential to Achieve Goals: Fair ADL Goals Pt Will Perform Lower Body Bathing: with min assist;sit to/from stand Pt Will Perform Lower Body Dressing: with mod assist;sit to/from stand Pt Will Transfer to Toilet: with mod assist;bedside commode Pt Will Perform Toileting - Clothing Manipulation and hygiene: with mod assist;sitting/lateral leans Pt/caregiver will Perform Home Exercise Program: Both right and left upper extremity;With Supervision;With written HEP provided;Increased strength   OT Frequency:  Min 2X/week       AM-PAC OT 6 Clicks Daily Activity     Outcome Measure Help from another person eating meals?: A Little Help from another person taking care of personal grooming?: A Little Help from another person toileting, which includes using toliet, bedpan, or urinal?: A Lot Help from another person bathing (including washing, rinsing, drying)?: A Lot Help from another person to put on and taking off regular upper body clothing?: A Little Help from another person to put on and taking off regular lower body clothing?: A Lot 6 Click Score: 15   End of Session Equipment Utilized During Treatment: Gait belt;Rolling walker (2 wheels) Nurse Communication: Mobility status;Patient requests pain meds;Other (comment) (uncessessful trial to [facility] due to pain but use of bed pan for voiding)  Activity Tolerance: Patient limited by pain Patient left: in bed;with call bell/phone within reach;with bed alarm set;with nursing/sitter in room;with SCD's reapplied  OT Visit Diagnosis: Unsteadiness on feet (R26.81);Other abnormalities of gait and mobility (R26.89);Muscle weakness (generalized) (M62.81);Cognitive communication deficit (R41.841);Pain Pain - part of body:  (back)                Time:  9145-9064 OT Time Calculation (min): 41 min Charges:  OT General Charges $OT Visit: 1 Visit OT Evaluation $OT Eval Low Complexity: 1 Low OT Treatments $Self Care/Home Management : 23-37 mins  Gatlyn Lipari OT/L Acute Rehabilitation Department  [phone]  10/20/2023, 10:10 AM

## 2023-10-20 NOTE — Progress Notes (Signed)
 PROGRESS NOTE    Jennifer Boyer  FMW:[id number] DOB: Sep 15, 1940 DOA: 10/18/2023 PCP: Dayna Motto, DO    Brief Narrative:  83 year old with history of dementia, pulmonology, type 2 diabetes on insulin , hypertension, GERD was suffering recently from dysuria and back pain, treated with Keflex .  Urine culture with E. coli.  Brought back to the ER because she was unable to walk due to pain.  In the emergency room hemodynamically stable.  CT scan angiogram chest abdomen pelvis without aneurysm dissection or stenosis.  CT scan of the lumbar spine and repeat CT scan with no acute findings.  Multilevel degenerative disc disease and facet hypertrophy.  She has history of previous surgery on the back.  Subjective: Patient seen and examined.  On my interview patient denied any complaints.  She told me she went to the bathroom and came back with severe pain.  Denies any dysuria. Assessment & Plan:   Intractable back pain, acute on chronic back pain with multilevel degenerative disease.  Difficulty mobility. Tylenol  1 g scheduled every 6 Robaxin 500 mg scheduled Q8 Lidocaine  patch Oxycodone  with bowel regimen Continue to work with PT OT.  Referred to SNF.  Acute UTI present on admission, completed 7 days of Keflex  therapy.  Pansensitive E. coli.  Chronic medical conditions including Hypothyroidism, on Synthroid  will resume. Type 2 diabetes, well-controlled.  On insulin  and metformin .  Continued.  Holding Jardiance for acute UTI. Hyperlipidemia, on a statin.  Continue. GERD, on PPI. Dementia, fairly stable.  Delirium precautions.  Fall precautions.  Continue Namenda .    DVT prophylaxis: heparin injection 5,000 Units Start: 10/19/23 0600 SCDs Start: 10/19/23 0257   Code Status: Full code Family Communication: None at the bedside Disposition Plan: Status is: Inpatient Remains inpatient appropriate because: Medically stable.  Needs SNF bed.     Consultants:  None  Procedures:   None  Antimicrobials:  Keflex      Objective: Vitals:   10/19/23 2019 10/20/23 0143 10/20/23 0633 10/20/23 0938  BP: 127/73 124/66 (!) 146/65 (!) 128/94  Pulse:  78 80 75  Resp: 16 17 16 18   Temp: 98.4 F (36.9 C) 97.9 F (36.6 C) 97.9 F (36.6 C) 97.7 F (36.5 C)  TempSrc: Oral  Oral Oral  SpO2: 95% 96% 96% 96%    Intake/Output Summary (Last 24 hours) at 10/20/2023 1228 Last data filed at 10/20/2023 1055 Gross per 24 hour  Intake 1310 ml  Output 1200 ml  Net 110 ml   There were no vitals filed for this visit.  Examination:  General exam: Appears calm and comfortable.  Pleasant.  Alert awake and mostly oriented but forgetful. Respiratory system: Clear to auscultation. Respiratory effort normal. Cardiovascular system: S1 & S2 heard, RRR. No JVD, murmurs, rubs, gallops or clicks. No pedal edema. Gastrointestinal system: Abdomen is nondistended, soft and nontender. No organomegaly or masses felt. Normal bowel sounds heard. Central nervous system: Alert and oriented. No focal neurological deficits.   Data Reviewed: I have personally reviewed following labs and imaging studies  CBC: Recent Labs  Lab 10/14/23 1648 10/18/23 1956 10/19/23 0352  WBC 5.4 7.9 7.5  NEUTROABS 2.8  --   --   HGB 9.6* 10.5* 9.9*  HCT 30.7* 35.0* 32.2*  MCV 98.4 100.0 99.1  PLT 205 279 244   Basic Metabolic Panel: Recent Labs  Lab 10/14/23 1648 10/18/23 1956 10/19/23 0352  NA 136 137 137  K 4.3 4.2 4.6  CL 101 102 103  CO2 21* 21* 21*  GLUCOSE  218* 243* 196*  BUN 13 28* 28*  CREATININE 1.00 0.92 0.80  0.78  CALCIUM  9.6 9.8 9.7   GFR: Estimated Creatinine Clearance: 66.2 mL/min (by C-G formula based on SCr of 0.78 mg/dL). Liver Function Tests: Recent Labs  Lab 10/14/23 1648  AST 21  ALT 9  ALKPHOS 72  BILITOT 0.5  PROT 8.4*  ALBUMIN 4.2   No results for input(s): LIPASE, AMYLASE in the last 168 hours. No results for input(s): AMMONIA in the last 168  hours. Coagulation Profile: Recent Labs  Lab 10/19/23 0352  INR 1.0   Cardiac Enzymes: No results for input(s): CKTOTAL, CKMB, CKMBINDEX, TROPONINI in the last 168 hours. BNP (last 3 results) No results for input(s): PROBNP in the last 8760 hours. HbA1C: Recent Labs    10/19/23 0352  HGBA1C 7.8*   CBG: Recent Labs  Lab 10/19/23 1201 10/19/23 1643 10/19/23 2014 10/20/23 0725 10/20/23 1135  GLUCAP 264* 171* 242* 129* 155*   Lipid Profile: No results for input(s): CHOL, HDL, LDLCALC, TRIG, CHOLHDL, LDLDIRECT in the last 72 hours. Thyroid  Function Tests: No results for input(s): TSH, T4TOTAL, FREET4, T3FREE, THYROIDAB in the last 72 hours. Anemia Panel: No results for input(s): VITAMINB12, FOLATE, FERRITIN, TIBC, IRON, RETICCTPCT in the last 72 hours. Sepsis Labs: No results for input(s): PROCALCITON, LATICACIDVEN in the last 168 hours.  Recent Results (from the past 240 hours)  Urine Culture     Status: Abnormal   Collection Time: 10/14/23  3:00 PM   Specimen: Urine, Clean Catch  Result Value Ref Range Status   Specimen Description URINE, CLEAN CATCH  Final   Special Requests NONE  Final   Culture (A)  Final    >=100,000 COLONIES/mL ESCHERICHIA COLI 10,000 COLONIES/mL GROUP B STREP(S.AGALACTIAE)ISOLATED TESTING AGAINST S. AGALACTIAE NOT ROUTINELY PERFORMED DUE TO PREDICTABILITY OF AMP/PEN/VAN SUSCEPTIBILITY. Performed at [facility], [address], [place], [place] [address]    Report Status 10/16/2023 FINAL  Final   Organism ID, Bacteria ESCHERICHIA COLI (A)  Final      Susceptibility   Escherichia coli - MIC*    AMPICILLIN <=2 SENSITIVE Sensitive     CEFAZOLIN  (URINE) Value in next row Sensitive      <=1 SENSITIVEThis is a modified FDA-approved test that has been validated and its performance characteristics determined by the reporting laboratory.  This laboratory is certified under the Clinical Laboratory  Improvement Amendments CLIA as qualified to perform high complexity clinical laboratory testing.    CEFEPIME Value in next row Sensitive      <=1 SENSITIVEThis is a modified FDA-approved test that has been validated and its performance characteristics determined by the reporting laboratory.  This laboratory is certified under the Clinical Laboratory Improvement Amendments CLIA as qualified to perform high complexity clinical laboratory testing.    ERTAPENEM Value in next row Sensitive      <=1 SENSITIVEThis is a modified FDA-approved test that has been validated and its performance characteristics determined by the reporting laboratory.  This laboratory is certified under the Clinical Laboratory Improvement Amendments CLIA as qualified to perform high complexity clinical laboratory testing.    CEFTRIAXONE Value in next row Sensitive      <=1 SENSITIVEThis is a modified FDA-approved test that has been validated and its performance characteristics determined by the reporting laboratory.  This laboratory is certified under the Clinical Laboratory Improvement Amendments CLIA as qualified to perform high complexity clinical laboratory testing.    CIPROFLOXACIN Value in next row Sensitive      <=  1 SENSITIVEThis is a modified FDA-approved test that has been validated and its performance characteristics determined by the reporting laboratory.  This laboratory is certified under the Clinical Laboratory Improvement Amendments CLIA as qualified to perform high complexity clinical laboratory testing.    GENTAMICIN Value in next row Sensitive      <=1 SENSITIVEThis is a modified FDA-approved test that has been validated and its performance characteristics determined by the reporting laboratory.  This laboratory is certified under the Clinical Laboratory Improvement Amendments CLIA as qualified to perform high complexity clinical laboratory testing.    NITROFURANTOIN Value in next row Sensitive      <=1 SENSITIVEThis  is a modified FDA-approved test that has been validated and its performance characteristics determined by the reporting laboratory.  This laboratory is certified under the Clinical Laboratory Improvement Amendments CLIA as qualified to perform high complexity clinical laboratory testing.    TRIMETH/SULFA Value in next row Sensitive      <=1 SENSITIVEThis is a modified FDA-approved test that has been validated and its performance characteristics determined by the reporting laboratory.  This laboratory is certified under the Clinical Laboratory Improvement Amendments CLIA as qualified to perform high complexity clinical laboratory testing.    AMPICILLIN/SULBACTAM Value in next row Sensitive      <=1 SENSITIVEThis is a modified FDA-approved test that has been validated and its performance characteristics determined by the reporting laboratory.  This laboratory is certified under the Clinical Laboratory Improvement Amendments CLIA as qualified to perform high complexity clinical laboratory testing.    PIP/TAZO Value in next row Sensitive      <=4 SENSITIVEThis is a modified FDA-approved test that has been validated and its performance characteristics determined by the reporting laboratory.  This laboratory is certified under the Clinical Laboratory Improvement Amendments CLIA as qualified to perform high complexity clinical laboratory testing.    MEROPENEM Value in next row Sensitive      <=4 SENSITIVEThis is a modified FDA-approved test that has been validated and its performance characteristics determined by the reporting laboratory.  This laboratory is certified under the Clinical Laboratory Improvement Amendments CLIA as qualified to perform high complexity clinical laboratory testing.    * >=100,000 COLONIES/mL ESCHERICHIA COLI         Radiology Studies: CT Angio Chest/Abd/Pel for Dissection W and/or Wo Contrast Result Date: 10/19/2023 CLINICAL DATA:  Back pain worsening over the past week,  diagnosed with UTI and placed on antibiotics 3 days ago with worsening pain and urinary frequency despite antibiotics. EXAM: CT ANGIOGRAPHY CHEST, ABDOMEN AND PELVIS TECHNIQUE: Non-contrast CT of the chest was initially obtained. Multidetector CT imaging through the chest, abdomen and pelvis was performed using the standard protocol during bolus administration of intravenous contrast. Multiplanar reconstructed images and MIPs were obtained and reviewed to evaluate the vascular anatomy. RADIATION DOSE REDUCTION: This exam was performed according to the departmental dose-optimization program which includes automated exposure control, adjustment of the mA and/or kV according to patient size and/or use of iterative reconstruction technique. CONTRAST:  OMNIPAQUE IOHEXOL 350 MG/ML SOLN COMPARISON:  CT abdomen and pelvis without contrast earlier today, CT abdomen and pelvis without contrast 10/14/2023. FINDINGS: CTA CHEST FINDINGS Cardiovascular: The pulmonary arteries are opacified more than the aorta. The pulmonary trunk is prominent measuring 3.2 cm. No arterial embolism is seen. The pulmonary veins are normal caliber. The cardiac size is normal. There is no pericardial effusion. There are trace scattered single-vessel calcific plaques in the LAD coronary artery. There is aortic atherosclerosis  and tortuosity and scattered calcification in the great vessels without aneurysm, stenosis or dissection. Great vessel branching standard. Mediastinum/Nodes: No enlarged mediastinal, hilar, or axillary lymph nodes. The lower poles of the thyroid  gland, trachea the main bronchi are unremarkable. There is moderate thickening at the EG junction. Endoscopic follow-up recommended. There is a moderate-sized hiatal hernia, with a small amount of fluid in the hernia sac to the right. Lungs/Pleura: There is diffuse bronchial thickening. Central airways are clear. There are mild biapical reticular scarring changes and mild posterior  atelectasis in the lungs without consolidation, effusion or suspicious nodule. No pneumothorax. There is mild subpleural reticulation in the right lung base. Musculoskeletal: Osteopenia with multilevel degenerative discs thoracic spine, thoracic spondylosis. Multilevel interbody ankylosis. No acute or other significant osseous findings. There are bilateral breast implants with partially calcified capsules. No chest wall mass is seen. Review of the MIP images confirms the above findings. CTA ABDOMEN AND PELVIS FINDINGS VASCULAR Aorta: Normal in caliber with mild patchy non stenosing calcifications. No aneurysm or dissection. Celiac: There is a 40% stenosis in the proximal vessel due to compression by the median arcuate ligament of the diaphragm. The remainder is widely patent. SMA: Patent without evidence of aneurysm, dissection, vasculitis or significant stenosis. There are nonstenosing ostial calcific plaques. Renals: Both renal arteries are patent without evidence of aneurysm, dissection, vasculitis, fibromuscular dysplasia or significant stenosis. Both demonstrate nonstenosing ostial calcific plaques. IMA: Patent without evidence of aneurysm, dissection, vasculitis or significant stenosis. Inflow: Patent without evidence of aneurysm, dissection, vasculitis or significant stenosis. There are scattered calcific plaques in both common iliac and both internal iliac arteries. The external iliac arteries are plaque free. Veins: No obvious venous abnormality within the limitations of this arterial phase study. Review of the MIP images confirms the above findings. NON-VASCULAR Hepatobiliary: There is a calcified granuloma in the dome of the right lobe. The liver is 20 cm length with mild steatosis without mass enhancement. Gallbladder is absent without significant biliary prominence. Pancreas: Partially atrophic.  No other focal abnormality. Spleen: No abnormality.  No splenomegaly. Adrenals/Urinary Tract: There is no  adrenal mass. There are no renal mass enhancement. Perinephric fat stranding is unchanged. There is no urinary stone or obstruction. There is faint perivesical stranding which may be seen with cystitis. There is mild bladder thickening versus underdistention. Stomach/Bowel: There are thickened folds in the stomach. No dilatation or wall thickening of the bowel. An appendix is not seen. Moderate retained stool noted ascending and proximal transverse colon. Descending and sigmoid diverticulosis again is noted without diverticulitis. Lymphatic: No lymphadenopathy is seen. Reproductive: Uterus and bilateral adnexa are unremarkable. Other: Small umbilical fat hernia. No incarcerated hernia. No free fluid or free air. Musculoskeletal: Osteopenia, multilevel degenerative changes. There is concern for a lytic lesion of the anterior superior L1 vertebral body to the right measuring 1.9 cm. This would be an atypical location for a Schmorl's node. This was not seen on the lumbar MRI from 08/01/2017. Lumbar spine MRI recommended without and with contrast. Left hemilaminectomy at L5 is again seen. There are degenerative changes of the hips. Right-sided sacral stimulator wiring noted with right flank implanted power source. Review of the MIP images confirms the above findings. IMPRESSION: 1. Aortic and branch vessel atherosclerosis without aneurysm, dissection or stenosis. 2. Prominent pulmonary trunk but no arterial embolism is seen. 3. Hiatal hernia with moderate thickening at the EG junction. Endoscopic follow-up recommended. 4. Bronchitis. 5. Likely gastritis. 6. Cystitis versus bladder nondistention. 7. Constipation and diverticulosis.  8. 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 9. 1.9 cm lytic lesion suspected of the anterior superior L1 vertebral body to the right. This would be an atypical location for a Schmorl's node. This was not seen on the lumbar  MRI from 08/01/2017. Lumbar spine MRI recommended without and with contrast. 10. Osteopenia and degenerative change. Aortic Atherosclerosis (ICD10-I70.0). Electronically Signed   By: Francis Quam M.D.   On: 10/19/2023 01:25   CT L-SPINE NO CHARGE Result Date: 10/18/2023 EXAM: CT OF THE LUMBAR SPINE WITHOUT CONTRAST 10/18/2023 10:56:32 PM TECHNIQUE: CT of the lumbar spine was performed without the administration of intravenous contrast. Multiplanar reformatted images are provided for review. Automated exposure control, iterative reconstruction, and/or weight based adjustment of the mA/kV was utilized to reduce the radiation dose to as low as reasonably achievable. COMPARISON: Lumbar spine CT 4 days ago 10/14/2023 CLINICAL HISTORY: PT states she has been having back pain for over a week, was diagnosed with a possible UTI put on abx on Saturday but the pain has gotten worse and has started having urinary frequency and has started to be incontinent. FINDINGS: BONES AND ALIGNMENT: Normal vertebral body heights. No acute fracture or suspicious bone lesion. Normal alignment. Left hemilaminectomy at L5. Presacral stimulator on the right. DEGENERATIVE CHANGES: Stable multilevel degenerative disc disease and facet hypertrophy with diffuse broad-based disc bulge. SOFT TISSUES: No acute abnormality. IMPRESSION: 1. No acute findings or change from CT 4 days ago. 2. Multilevel degenerative disc disease and facet hypertrophy. 3. Left hemilaminectomy at L5. Electronically signed by: Andrea Gasman MD 10/18/2023 11:17 PM EDT RP Workstation: [id number]   CT Renal Stone Study Result Date: 10/18/2023 EXAM: CT UROGRAM 10/18/2023 10:56:32 PM TECHNIQUE: CT of the abdomen and pelvis was performed without the administration of intravenous contrast. Multiplanar reformatted images as well as MIP urogram images are provided for review. Automated exposure control, iterative reconstruction, and/or weight based adjustment of the mA/kV  was utilized to reduce the radiation dose to as low as reasonably achievable. COMPARISON: CT 10/14/2023, 4 days ago. CLINICAL HISTORY: Abdominal/flank pain, stone suspected. Triage Notes: PT states she has been having back pain for over a week, was diagnosed with a possible UTI put on abx on Saturday but the pain has gotten worse and has started having urinary frequency and has started to be incontinent. FINDINGS: LOWER CHEST: Small hiatal hernia. LIVER: Punctate hepatic granuloma. GALLBLADDER AND BILE DUCTS: Cholecystectomy. No biliary ductal dilatation. SPLEEN: No acute abnormality. PANCREAS: Pancreatic parenchymal atrophy. No ductal dilatation or inflammation. ADRENAL GLANDS: No acute abnormality. KIDNEYS, URETERS AND BLADDER: No renal stones or hydronephrosis. No evidence of renal inflammation. Urinary bladder is minimally distended, normal for degree of extension. GI AND BOWEL: Small hiatal hernia. Colonic diverticulosis without diverticulitis. Moderate colonic stool burden. Fecalization of distal small bowel contents. There is no bowel obstruction. PERITONEUM AND RETROPERITONEUM: No ascites. No free air. VASCULATURE: Aorta is normal in caliber. Aortic atherosclerosis without aneurysm. LYMPH NODES: No lymphadenopathy. REPRODUCTIVE ORGANS: No acute abnormality. BONES AND SOFT TISSUES: Bilateral breast implants. Small fat-containing umbilical hernia. No acute osseous abnormality. Lumbar spine reported separately on lumbar spine reformats. No focal soft tissue abnormality. IMPRESSION: 1. No renal stones or hydronephrosis. No acute intra-abdominal or pelvic abnormality. 2. Diverticulosis without diverticulitis. 3. Aortic atherosclerosis (ICD10-170.0) Electronically signed by: Andrea Gasman MD 10/18/2023 11:14 PM EDT RP Workstation: [id number]        Scheduled Meds:  acetaminophen   1,000 mg  Oral QID   cephALEXin   500 mg Oral QID   cholecalciferol   1,000 Units Oral Weekly   donepezil   10 mg Oral QHS    escitalopram  10 mg Oral Daily   heparin  5,000 Units Subcutaneous Q8H   insulin  aspart  0-6 Units Subcutaneous TID WC   insulin  glargine  35 Units Subcutaneous Daily   levothyroxine   75 mcg Oral QAC breakfast   lidocaine   1 patch Transdermal Q24H   memantine   10 mg Oral BID   metFORMIN   1,000 mg Oral BID WC   methocarbamol  500 mg Oral TID   pantoprazole   40 mg Oral Daily   Continuous Infusions:   LOS: 1 day    Time spent: 35 minutes    Renato Applebaum, MD [facility]

## 2023-10-20 NOTE — Plan of Care (Signed)
  Problem: Nutritional: Goal: Maintenance of adequate nutrition will improve Outcome: Completed/Met

## 2023-10-21 DIAGNOSIS — M5459 Other low back pain: Secondary | ICD-10-CM | POA: Diagnosis not present

## 2023-10-21 LAB — CBC
HCT: 36.1 % (ref 36.0–46.0)
Hemoglobin: 10.7 g/dL — ABNORMAL LOW (ref 12.0–15.0)
MCH: 30.2 pg (ref 26.0–34.0)
MCHC: 29.6 g/dL — ABNORMAL LOW (ref 30.0–36.0)
MCV: 102 fL — ABNORMAL HIGH (ref 80.0–100.0)
Platelets: 277 K/uL (ref 150–400)
RBC: 3.54 MIL/uL — ABNORMAL LOW (ref 3.87–5.11)
RDW: 16 % — ABNORMAL HIGH (ref 11.5–15.5)
WBC: 8.1 K/uL (ref 4.0–10.5)
nRBC: 0 % (ref 0.0–0.2)

## 2023-10-21 LAB — PHOSPHORUS: Phosphorus: 3.6 mg/dL (ref 2.5–4.6)

## 2023-10-21 LAB — BASIC METABOLIC PANEL WITH GFR
Anion gap: 12 (ref 5–15)
BUN: 28 mg/dL — ABNORMAL HIGH (ref 8–23)
CO2: 23 mmol/L (ref 22–32)
Calcium: 9.5 mg/dL (ref 8.9–10.3)
Chloride: 101 mmol/L (ref 98–111)
Creatinine, Ser: 0.93 mg/dL (ref 0.44–1.00)
GFR, Estimated: >60 mL/min (ref >60)
Glucose, Bld: 153 mg/dL — ABNORMAL HIGH (ref 70–99)
Potassium: 4.4 mmol/L (ref 3.5–5.1)
Sodium: 136 mmol/L (ref 135–145)

## 2023-10-21 LAB — MAGNESIUM: Magnesium: 2.1 mg/dL (ref 1.7–2.4)

## 2023-10-21 LAB — GLUCOSE, CAPILLARY
Glucose-Capillary: 145 mg/dL — ABNORMAL HIGH (ref 70–99)
Glucose-Capillary: 176 mg/dL — ABNORMAL HIGH (ref 70–99)
Glucose-Capillary: 224 mg/dL — ABNORMAL HIGH (ref 70–99)
Glucose-Capillary: 156 mg/dL — ABNORMAL HIGH (ref 70–99)

## 2023-10-21 LAB — FOLATE: Folate: 18.3 ng/mL (ref >5.9)

## 2023-10-21 LAB — VITAMIN B12: Vitamin B-12: 3378 pg/mL — ABNORMAL HIGH (ref 180–914)

## 2023-10-21 LAB — IRON AND TIBC
Iron: 52 ug/dL (ref 28–170)
Saturation Ratios: 10 % — ABNORMAL LOW (ref 10.4–31.8)
TIBC: 496 ug/dL — ABNORMAL HIGH (ref 250–450)
UIBC: 444 ug/dL

## 2023-10-21 MED ORDER — IRON SUCROSE 300 MG IVPB - SIMPLE MED
300.0000 mg | Freq: Once | Status: AC
  Administered 2023-10-21: 300 mg via INTRAVENOUS
  Filled 2023-10-21: qty 300

## 2023-10-21 MED ORDER — METOCLOPRAMIDE HCL 5 MG PO TABS
5.0000 mg | ORAL_TABLET | Freq: Three times a day (TID) | ORAL | Status: AC
  Administered 2023-10-21 (×2): 5 mg via ORAL
  Filled 2023-10-21 (×2): qty 1

## 2023-10-21 MED ORDER — POLYETHYLENE GLYCOL 3350 17 G PO PACK
17.0000 g | PACK | Freq: Every day | ORAL | Status: DC
  Administered 2023-10-22 – 2023-10-24 (×3): 17 g via ORAL
  Filled 2023-10-21 (×3): qty 1

## 2023-10-21 MED ORDER — BISACODYL 10 MG RE SUPP: 10.0000 mg | Freq: Every day | RECTAL | Status: DC | PRN

## 2023-10-21 MED ORDER — ACETAMINOPHEN 500 MG PO TABS
1000.0000 mg | ORAL_TABLET | Freq: Three times a day (TID) | ORAL | Status: DC
  Administered 2023-10-21 – 2023-10-24 (×8): 1000 mg via ORAL
  Filled 2023-10-21 (×8): qty 2

## 2023-10-21 MED ORDER — BISACODYL 5 MG PO TBEC: 10.0000 mg | DELAYED_RELEASE_TABLET | Freq: Every day | ORAL | Status: DC | PRN

## 2023-10-21 NOTE — Progress Notes (Signed)
 Physical Therapy Treatment Patient Details Name: Jennifer Boyer MRN: [id number] DOB: 08/09/1940 Today's Date: 10/21/2023   History of Present Illness 83 yo female presents to therapy following hospital admission on 10/18/2023 due to back pain > 1 wk. Pt was dx with UTI 9/25 and started ABX however pain continued, increased urinary frequency and pt now having difficulty walking, pt returned to ED. No acute findings on imaging and labs grossly unremarkable. Pt PMH includes but is not limited to: fibromyalgia, DM II, HTN, GERD,  dementia, lumbar herniation, DJD, and R sacral stimulator.    PT Comments  Reviewed log roll technique to minimize strain to back with bed mobility. Pt required max assist for sidelying to sit 2* 10/10 low back pain. Pt sat at the edge of the bed for ~4 minutes but had unremitting 10/10 back pain, she stated she wasn't able to tolerate further activity so was assisted back to bed. Patient will benefit from continued inpatient follow up therapy, <3 hours/day.    If plan is discharge home, recommend the following: Assistance with cooking/housework;Assist for transportation;A lot of help with walking and/or transfers;A lot of help with bathing/dressing/bathroom   Can travel by private vehicle     No  Equipment Recommendations  None recommended by PT    Recommendations for Other Services       Precautions / Restrictions Precautions Precautions: Fall Recall of Precautions/Restrictions: Intact Restrictions Weight Bearing Restrictions Per Provider Order: No     Mobility  Bed Mobility Overal bed mobility: Needs Assistance Bed Mobility: Rolling, Sidelying to Sit, Sit to Supine Rolling: Min assist Sidelying to sit: Max assist, Used rails     Sit to sidelying: Mod assist, Used rails General bed mobility comments: VCs for log roll technique, max A to raise trunk, increased time/effort 2* pain; then assist for BLEs back into bed. Pt sat at EOB ~4 minutes but had severe  10/10 back pain that did not resolve.    Transfers                   General transfer comment: NT-pt unable to tolerate sitting EOB    Ambulation/Gait                   Stairs             Wheelchair Mobility     Tilt Bed    Modified Rankin (Stroke Patients Only)       Balance Overall balance assessment: Needs assistance Sitting-balance support: Feet supported, No upper extremity supported Sitting balance-Leahy Scale: Fair   Postural control:  (forward lean for pain management)                                  Communication Communication Communication: No apparent difficulties  Cognition Arousal: Alert Behavior During Therapy: WFL for tasks assessed/performed   PT - Cognitive impairments: History of cognitive impairments                         Following commands: Intact      Cueing Cueing Techniques: Verbal cues  Exercises      General Comments        Pertinent Vitals/Pain Pain Assessment Pain Score: 10-Worst pain ever Breathing: noisy labored breathing, long periods of hyperventilation, Cheyne-Stokes respirations Negative Vocalization: occasional moan/groan, low speech, negative/disapproving quality Facial Expression: facial grimacing Body Language: tense, distressed pacing, fidgeting  Consolability: unable to console, distract or reassure PAINAD Score: 8 Pain Descriptors / Indicators: Aching, Constant, Discomfort, Grimacing, Guarding Pain Intervention(s): Limited activity within patient's tolerance, Monitored during session, Premedicated before session, Repositioned    Home Living                          Prior Function            PT Goals (current goals can now be found in the care plan section) Acute Rehab PT Goals Patient Stated Goal: to get rid of the pain PT Goal Formulation: With patient Time For Goal Achievement: 11/02/23 Potential to Achieve Goals: Good Progress towards PT  goals: Not progressing toward goals - comment (pain limiting activity tolerance)    Frequency    Min 2X/week      PT Plan      Co-evaluation              AM-PAC PT 6 Clicks Mobility   Outcome Measure  Help needed turning from your back to your side while in a flat bed without using bedrails?: A Little Help needed moving from lying on your back to sitting on the side of a flat bed without using bedrails?: A Lot Help needed moving to and from a bed to a chair (including a wheelchair)?: Total Help needed standing up from a chair using your arms (e.g., wheelchair or bedside chair)?: Total Help needed to walk in hospital room?: Total Help needed climbing 3-5 steps with a railing? : Total 6 Click Score: 9    End of Session Equipment Utilized During Treatment: Gait belt Activity Tolerance: Patient limited by pain Patient left: in bed;with bed alarm set;with call bell/phone within reach Nurse Communication: Mobility status;Patient requests pain meds PT Visit Diagnosis: Unsteadiness on feet (R26.81);Other abnormalities of gait and mobility (R26.89);Muscle weakness (generalized) (M62.81);Difficulty in walking, not elsewhere classified (R26.2);Pain     Time: 8494-8483 PT Time Calculation (min) (ACUTE ONLY): 11 min  Charges:    $Gait Training: 8-22 mins PT General Charges $$ ACUTE PT VISIT: 1 Visit                     Sylvan Delon Copp PT 10/21/2023  Acute Rehabilitation Services  Office [phone]

## 2023-10-21 NOTE — Plan of Care (Signed)
  Problem: Education: Goal: Ability to describe self-care measures that may prevent or decrease complications (Diabetes Survival Skills Education) will improve Outcome: Progressing   Problem: Metabolic: Goal: Ability to maintain appropriate glucose levels will improve Outcome: Progressing   Problem: Clinical Measurements: Goal: Ability to maintain clinical measurements within normal limits will improve Outcome: Progressing

## 2023-10-21 NOTE — TOC Progression Note (Addendum)
 Transition of Care [facility]) - Progression Note    Patient Details  Name: Jennifer Boyer MRN: [id number] Date of Birth: 1940/06/02  Transition of Care [facility]) CM/SW Contact  [name], RN Phone Number: 10/21/2023, 12:20 PM  Clinical Narrative:   Met with patient and patient's dtr, [name], at bedside to introduce role of INPT Care Mgmt and review for dc planning, PT recommendation for short term rehab/SNF, [name] agreeable, no preference. Sherlean reports patient currently resides at [facility], no current home care services, has a RW and cane but was ambulating independently prior to hospital admission.   -1:07pm FL2 updated, faxed out for bed offers. Patient's dtr request SNF bed offer list be emailed to her at: [email or web address]    Expected Discharge Plan: Skilled Nursing Facility Barriers to Discharge: Continued Medical Work up               Expected Discharge Plan and Services In-house Referral: NA Discharge Planning Services: NA Post Acute Care Choice: Home Health Living arrangements for the past 2 months: Independent Living Facility ([facility])                 DME Arranged: N/A DME Agency: NA       HH Arranged: PT HH Agency: [facility] Date HH Agency Contacted: 10/19/23 Time HH Agency Contacted: 1354 Representative spoke with at [facility] Agency: Jon   Social Drivers of Health (SDOH) Interventions SDOH Screenings   Food Insecurity: No Food Insecurity (10/19/2023)  Housing: Unknown (10/19/2023)  Transportation Needs: No Transportation Needs (10/19/2023)  Utilities: Not At Risk (10/19/2023)  Social Connections: Moderately Isolated (10/19/2023)  Tobacco Use: Low Risk  (10/19/2023)    Readmission Risk Interventions    10/21/2023   12:18 PM 10/19/2023    1:53 PM  Readmission Risk Prevention Plan  Transportation Screening Complete Complete  PCP or Specialist Appt within 5-7 Days Complete Complete  Home Care  Screening Complete Complete  Medication Review (RN CM) Complete Complete

## 2023-10-21 NOTE — Progress Notes (Signed)
 PROGRESS NOTE    Jennifer Boyer  FMW:[id number] DOB: 1940/07/12 DOA: 10/18/2023 PCP: Dayna Motto, DO    Brief Narrative:  83 year old with history of dementia, pulmonology, type 2 diabetes on insulin , hypertension, GERD was suffering recently from dysuria and back pain, treated with Keflex .  Urine culture with E. coli.  Brought back to the ER because she was unable to walk due to pain.  In the emergency room hemodynamically stable.  CT scan angiogram chest abdomen pelvis without aneurysm dissection or stenosis.  CT scan of the lumbar spine and repeat CT scan with no acute findings.  Multilevel degenerative disc disease and facet hypertrophy.  She has history of previous surgery on the back.  Subjective: Patient seen and examined.  Patient was laying comfortably in the bed, still has significant lower backache 7/10, feels some improvement after pain medications.  Still has some nausea, denied any vomiting, no abdominal pain.   Assessment & Plan:   Intractable back pain, acute on chronic back pain with multilevel degenerative disease.  Difficulty mobility. Tylenol  1 g scheduled TID Robaxin 500 mg scheduled Q8 Lidocaine  patch Oxycodone  with bowel regimen Continue to work with PT OT.  Referred to SNF.  Acute UTI present on admission, completed 7 days of Keflex  therapy.  Pansensitive E. coli.  Chronic medical conditions including Hypothyroidism, on Synthroid  will resume. Type 2 diabetes, well-controlled.  On insulin  and metformin .  Continued.  Holding Jardiance for acute UTI. Hyperlipidemia, on a statin.  Continue. GERD, on PPI. Dementia, fairly stable.  Delirium precautions.  Fall precautions.  Continue Namenda .   Iron deficiency, Tsat 10% Venofer 300 mg IV x 1 dose given on 10/2 Start oral iron supplement on discharge, repeat iron profile after 3 to 6 months.  Follow with PCP.  DVT prophylaxis: heparin injection 5,000 Units Start: 10/19/23 0600 SCDs Start: 10/19/23 0257   Code  Status: Full code Family Communication: None at the bedside Disposition Plan: Status is: Inpatient Remains inpatient appropriate because: Medically stable.  Needs SNF bed. TOC working for SNF placement.    Consultants:  None  Procedures:  None  Antimicrobials:  Keflex      Objective: Vitals:   10/20/23 1358 10/20/23 2127 10/21/23 0537 10/21/23 1349  BP: 129/73 127/75 125/75 122/76  Pulse: 92 95 86 89  Resp: 18 17 17    Temp: 98.7 F (37.1 C) 97.9 F (36.6 C) 97.6 F (36.4 C) 97.7 F (36.5 C)  TempSrc: Oral Oral Oral Oral  SpO2: 95% 97% 96% 96%    Intake/Output Summary (Last 24 hours) at 10/21/2023 1535 Last data filed at 10/21/2023 1400 Gross per 24 hour  Intake 1290 ml  Output 730 ml  Net 560 ml   There were no vitals filed for this visit.  Examination:  General exam: Appears calm and comfortable.  Pleasant.  Alert awake and mostly oriented but forgetful. Respiratory system: Clear to auscultation. Respiratory effort normal. Cardiovascular system: S1 & S2 heard, RRR. No JVD, murmurs, rubs, gallops or clicks. No pedal edema. Gastrointestinal system: Abdomen is nondistended, soft and nontender. No organomegaly or masses felt. Normal bowel sounds heard. Central nervous system: Alert and oriented. No focal neurological deficits.   Data Reviewed: I have personally reviewed following labs and imaging studies  CBC: Recent Labs  Lab 10/14/23 1648 10/18/23 1956 10/19/23 0352 10/21/23 1007  WBC 5.4 7.9 7.5 8.1  NEUTROABS 2.8  --   --   --   HGB 9.6* 10.5* 9.9* 10.7*  HCT 30.7* 35.0* 32.2* 36.1  MCV 98.4 100.0 99.1 102.0*  PLT 205 279 244 277   Basic Metabolic Panel: Recent Labs  Lab 10/14/23 1648 10/18/23 1956 10/19/23 0352 10/21/23 1007  NA 136 137 137 136  K 4.3 4.2 4.6 4.4  CL 101 102 103 101  CO2 21* 21* 21* 23  GLUCOSE 218* 243* 196* 153*  BUN 13 28* 28* 28*  CREATININE 1.00 0.92 0.80  0.78 0.93  CALCIUM  9.6 9.8 9.7 9.5  MG  --   --   --  2.1   PHOS  --   --   --  3.6   GFR: Estimated Creatinine Clearance: 56.9 mL/min (by C-G formula based on SCr of 0.93 mg/dL). Liver Function Tests: Recent Labs  Lab 10/14/23 1648  AST 21  ALT 9  ALKPHOS 72  BILITOT 0.5  PROT 8.4*  ALBUMIN 4.2   No results for input(s): LIPASE, AMYLASE in the last 168 hours. No results for input(s): AMMONIA in the last 168 hours. Coagulation Profile: Recent Labs  Lab 10/19/23 0352  INR 1.0   Cardiac Enzymes: No results for input(s): CKTOTAL, CKMB, CKMBINDEX, TROPONINI in the last 168 hours. BNP (last 3 results) No results for input(s): PROBNP in the last 8760 hours. HbA1C: Recent Labs    10/19/23 0352  HGBA1C 7.8*   CBG: Recent Labs  Lab 10/20/23 1135 10/20/23 1631 10/20/23 2129 10/21/23 0725 10/21/23 1108  GLUCAP 155* 201* 201* 145* 176*   Lipid Profile: No results for input(s): CHOL, HDL, LDLCALC, TRIG, CHOLHDL, LDLDIRECT in the last 72 hours. Thyroid  Function Tests: No results for input(s): TSH, T4TOTAL, FREET4, T3FREE, THYROIDAB in the last 72 hours. Anemia Panel: Recent Labs    10/21/23 1007  VITAMINB12 3,378*  FOLATE 18.3  TIBC 496*  IRON 52   Sepsis Labs: No results for input(s): PROCALCITON, LATICACIDVEN in the last 168 hours.  Recent Results (from the past 240 hours)  Urine Culture     Status: Abnormal   Collection Time: 10/14/23  3:00 PM   Specimen: Urine, Clean Catch  Result Value Ref Range Status   Specimen Description URINE, CLEAN CATCH  Final   Special Requests NONE  Final   Culture (A)  Final    >=100,000 COLONIES/mL ESCHERICHIA COLI 10,000 COLONIES/mL GROUP B STREP(S.AGALACTIAE)ISOLATED TESTING AGAINST S. AGALACTIAE NOT ROUTINELY PERFORMED DUE TO PREDICTABILITY OF AMP/PEN/VAN SUSCEPTIBILITY. Performed at [facility], [address], [place], [place] [address]    Report Status 10/16/2023 FINAL  Final   Organism ID, Bacteria ESCHERICHIA COLI (A)  Final       Susceptibility   Escherichia coli - MIC*    AMPICILLIN <=2 SENSITIVE Sensitive     CEFAZOLIN  (URINE) Value in next row Sensitive      <=1 SENSITIVEThis is a modified FDA-approved test that has been validated and its performance characteristics determined by the reporting laboratory.  This laboratory is certified under the Clinical Laboratory Improvement Amendments CLIA as qualified to perform high complexity clinical laboratory testing.    CEFEPIME Value in next row Sensitive      <=1 SENSITIVEThis is a modified FDA-approved test that has been validated and its performance characteristics determined by the reporting laboratory.  This laboratory is certified under the Clinical Laboratory Improvement Amendments CLIA as qualified to perform high complexity clinical laboratory testing.    ERTAPENEM Value in next row Sensitive      <=1 SENSITIVEThis is a modified FDA-approved test that has been validated and its performance characteristics determined by the reporting  laboratory.  This laboratory is certified under the Clinical Laboratory Improvement Amendments CLIA as qualified to perform high complexity clinical laboratory testing.    CEFTRIAXONE Value in next row Sensitive      <=1 SENSITIVEThis is a modified FDA-approved test that has been validated and its performance characteristics determined by the reporting laboratory.  This laboratory is certified under the Clinical Laboratory Improvement Amendments CLIA as qualified to perform high complexity clinical laboratory testing.    CIPROFLOXACIN Value in next row Sensitive      <=1 SENSITIVEThis is a modified FDA-approved test that has been validated and its performance characteristics determined by the reporting laboratory.  This laboratory is certified under the Clinical Laboratory Improvement Amendments CLIA as qualified to perform high complexity clinical laboratory testing.    GENTAMICIN Value in next row Sensitive      <=1 SENSITIVEThis is a  modified FDA-approved test that has been validated and its performance characteristics determined by the reporting laboratory.  This laboratory is certified under the Clinical Laboratory Improvement Amendments CLIA as qualified to perform high complexity clinical laboratory testing.    NITROFURANTOIN Value in next row Sensitive      <=1 SENSITIVEThis is a modified FDA-approved test that has been validated and its performance characteristics determined by the reporting laboratory.  This laboratory is certified under the Clinical Laboratory Improvement Amendments CLIA as qualified to perform high complexity clinical laboratory testing.    TRIMETH/SULFA Value in next row Sensitive      <=1 SENSITIVEThis is a modified FDA-approved test that has been validated and its performance characteristics determined by the reporting laboratory.  This laboratory is certified under the Clinical Laboratory Improvement Amendments CLIA as qualified to perform high complexity clinical laboratory testing.    AMPICILLIN/SULBACTAM Value in next row Sensitive      <=1 SENSITIVEThis is a modified FDA-approved test that has been validated and its performance characteristics determined by the reporting laboratory.  This laboratory is certified under the Clinical Laboratory Improvement Amendments CLIA as qualified to perform high complexity clinical laboratory testing.    PIP/TAZO Value in next row Sensitive      <=4 SENSITIVEThis is a modified FDA-approved test that has been validated and its performance characteristics determined by the reporting laboratory.  This laboratory is certified under the Clinical Laboratory Improvement Amendments CLIA as qualified to perform high complexity clinical laboratory testing.    MEROPENEM Value in next row Sensitive      <=4 SENSITIVEThis is a modified FDA-approved test that has been validated and its performance characteristics determined by the reporting laboratory.  This laboratory is  certified under the Clinical Laboratory Improvement Amendments CLIA as qualified to perform high complexity clinical laboratory testing.    * >=100,000 COLONIES/mL ESCHERICHIA COLI         Radiology Studies: No results found.       Scheduled Meds:  acetaminophen   1,000 mg Oral QID   cephALEXin   500 mg Oral QID   cholecalciferol   1,000 Units Oral Weekly   donepezil   10 mg Oral QHS   escitalopram  10 mg Oral Daily   heparin  5,000 Units Subcutaneous Q8H   insulin  aspart  0-6 Units Subcutaneous TID WC   insulin  glargine  35 Units Subcutaneous Daily   levothyroxine   75 mcg Oral QAC breakfast   lidocaine   1 patch Transdermal Q24H   memantine   10 mg Oral BID   metFORMIN   1,000 mg Oral BID WC   methocarbamol  500 mg Oral  TID   metoCLOPramide  5 mg Oral TID AC   pantoprazole   40 mg Oral Daily   Continuous Infusions:   LOS: 2 days    Time spent: 35 minutes    Elvan Sor, MD [facility]

## 2023-10-21 NOTE — Plan of Care (Signed)
 ?  Problem: Clinical Measurements: ?Goal: Will remain free from infection ?Outcome: Progressing ?  ?

## 2023-10-21 NOTE — NC FL2 (Signed)
 [place]  MEDICAID FL2 LEVEL OF CARE FORM     IDENTIFICATION  Patient Name: Jennifer Boyer Birthdate: 1940-08-21 Sex: female Admission Date (Current Location): 10/18/2023  [facility] and [place] Number:  Producer, television/film/video and Address:  [facility],  [address] [place], [place] [address]      Provider Number: [id number]  Attending Physician Name and Address:  Von Bellis, MD  Relative Name and Phone Number:  Blair Handler (Daughter)  [phone] [facility])    Current Level of Care: Hospital Recommended Level of Care: Skilled Nursing Facility Prior Approval Number:    Date Approved/Denied:   PASRR Number: [id number]  Discharge Plan: SNF    Current Diagnoses: Patient Active Problem List   Diagnosis Date Noted   Intractable low back pain 10/19/2023   DKA, type 2 (HCC) 07/29/2021   Macrocytic anemia 07/29/2021   Hyperkalemia 07/29/2021   AKI (acute kidney injury) 07/29/2021   HTN (hypertension) 07/29/2021   Hypothyroidism 07/29/2021   HLD (hyperlipidemia) 07/29/2021   HNP (herniated nucleus pulposus), lumbar 03/24/2017    Orientation RESPIRATION BLADDER Height & Weight     Self, Place  Normal Incontinent, External catheter Weight:   Height:     BEHAVIORAL SYMPTOMS/MOOD NEUROLOGICAL BOWEL NUTRITION STATUS      Incontinent Diet (heart healthy/carb modified)  AMBULATORY STATUS COMMUNICATION OF NEEDS Skin   Extensive Assist Verbally Normal                       Personal Care Assistance Level of Assistance  Bathing, Feeding, Dressing Bathing Assistance: Maximum assistance Feeding assistance: Limited assistance Dressing Assistance: Maximum assistance     Functional Limitations Info  Sight, Hearing, Speech Sight Info: Impaired (eyeglasses) Hearing Info: Adequate Speech Info: Adequate    SPECIAL CARE FACTORS FREQUENCY  PT (By licensed PT), OT (By licensed OT)     PT Frequency: 5x/wk OT Frequency: 5x/wk            Contractures  Contractures Info: Not present    Additional Factors Info  Code Status, Allergies, Psychotropic Code Status Info: Full Code Allergies Info: NKA Psychotropic Info: Lexapro 10mg  po daily         Current Medications (10/21/2023):  This is the current hospital active medication list Current Facility-Administered Medications  Medication Dose Route Frequency Provider Last Rate Last Admin   acetaminophen  (TYLENOL ) tablet 1,000 mg  1,000 mg Oral QID Ghimire, Kuber, MD   1,000 mg at 10/21/23 0817   albuterol (PROVENTIL) (2.5 MG/3ML) 0.083% nebulizer solution 2.5 mg  2.5 mg Nebulization Q2H PRN Debby Camila LABOR, MD       cephALEXin  (KEFLEX ) capsule 500 mg  500 mg Oral QID Ghimire, Kuber, MD   500 mg at 10/21/23 1251   cholecalciferol  (VITAMIN D3) 25 MCG (1000 UNIT) tablet 1,000 Units  1,000 Units Oral Weekly Ghimire, Kuber, MD   1,000 Units at 10/19/23 0949   donepezil  (ARICEPT ) tablet 10 mg  10 mg Oral QHS Ghimire, Kuber, MD   10 mg at 10/20/23 2130   escitalopram (LEXAPRO) tablet 10 mg  10 mg Oral Daily Ghimire, Kuber, MD   10 mg at 10/21/23 0818   fentaNYL  (SUBLIMAZE ) injection 12.5-50 mcg  12.5-50 mcg Intravenous Q2H PRN Thomas, Sara-Maiz A, MD   50 mcg at 10/19/23 1125   heparin injection 5,000 Units  5,000 Units Subcutaneous Q8H Debby Camila A, MD   5,000 Units at 10/21/23 1251   hydrALAZINE  (APRESOLINE ) injection 10 mg  10 mg Intravenous Q4H  PRN Debby Camila LABOR, MD       insulin  aspart (novoLOG ) injection 0-6 Units  0-6 Units Subcutaneous TID WC Debby Camila LABOR, MD   1 Units at 10/21/23 1115   insulin  glargine (LANTUS ) injection 35 Units  35 Units Subcutaneous Daily Raenelle Coria, MD   35 Units at 10/21/23 0819   levothyroxine  (SYNTHROID ) tablet 75 mcg  75 mcg Oral QAC breakfast Raenelle Coria, MD   75 mcg at 10/21/23 0547   lidocaine  (LIDODERM ) 5 % 1 patch  1 patch Transdermal Q24H Logan Martinis B, PA-C   1 patch at 10/20/23 2131   memantine  (NAMENDA ) tablet 10 mg  10 mg  Oral BID Ghimire, Kuber, MD   10 mg at 10/21/23 0818   metFORMIN  (GLUCOPHAGE ) tablet 1,000 mg  1,000 mg Oral BID WC Ghimire, Kuber, MD   1,000 mg at 10/21/23 0818   methocarbamol (ROBAXIN) tablet 500 mg  500 mg Oral TID Ghimire, Kuber, MD   500 mg at 10/21/23 0818   metoCLOPramide (REGLAN) tablet 5 mg  5 mg Oral TID AC Kumar, Dileep, MD   5 mg at 10/21/23 1251   ondansetron  (ZOFRAN ) tablet 4 mg  4 mg Oral Q6H PRN Debby Camila LABOR, MD       Or   ondansetron  (ZOFRAN ) injection 4 mg  4 mg Intravenous Q6H PRN Debby Camila LABOR, MD   4 mg at 10/21/23 9047   oxyCODONE -acetaminophen  (PERCOCET/ROXICET) 5-325 MG per tablet 1 tablet  1 tablet Oral Q6H PRN Ghimire, Kuber, MD   1 tablet at 10/21/23 0958   pantoprazole  (PROTONIX ) EC tablet 40 mg  40 mg Oral Daily Ghimire, Kuber, MD   40 mg at 10/21/23 0818   traMADol (ULTRAM) tablet 50 mg  50 mg Oral Q8H PRN Thomas, Sara-Maiz A, MD   50 mg at 10/21/23 0547     Discharge Medications: Please see discharge summary for a list of discharge medications.  Relevant Imaging Results:  Relevant Lab Results:   Additional Information SSN: [id number]  [name], RN

## 2023-10-21 NOTE — Progress Notes (Signed)
 PT Note  Patient Details Name: Jennifer Boyer MRN: [id number] DOB: 04-May-1940  Attempted to see patient this morning to assess pt's progress and update DC recommendations, however pt just had gotten back to bed from sitting up in recliner for past 3 hours after breakfast and was very fatigued. She also reported she is very nauseous and NT reported she had thrown up this morning as well.   Based up previous PT and OT notes, as well as NT reporting the patient required assistance using RW to only get to bedside commode and unable to walk to bathroom this morning, in addition to clarifying she is in an independent apartment ( needing to be I with all meals, ADLs, Medicines etc) , PT would like to change the pt's recommendation to needing SNF prior to returning back to a lower level living situation.   Will still attempt to see and assess pt this afternoon, just wanted to update our DC recommendation as this time after learning more about pt's lack of progress and living situation.     MILLICENT SALES 10/21/2023, 11:58 AM SALES, PT, MPT Acute Rehabilitation Services Office: [phone] If a weekend: secure chat groups: [facility] PT, [facility] OT, [facility] SLP 10/21/2023

## 2023-10-22 DIAGNOSIS — M5459 Other low back pain: Secondary | ICD-10-CM | POA: Diagnosis not present

## 2023-10-22 LAB — GLUCOSE, CAPILLARY
Glucose-Capillary: 129 mg/dL — ABNORMAL HIGH (ref 70–99)
Glucose-Capillary: 220 mg/dL — ABNORMAL HIGH (ref 70–99)
Glucose-Capillary: 104 mg/dL — ABNORMAL HIGH (ref 70–99)
Glucose-Capillary: 160 mg/dL — ABNORMAL HIGH (ref 70–99)

## 2023-10-22 MED ORDER — POLYSACCHARIDE IRON COMPLEX 150 MG PO CAPS
150.0000 mg | ORAL_CAPSULE | Freq: Every day | ORAL | Status: DC
  Administered 2023-10-22 – 2023-10-24 (×3): 150 mg via ORAL
  Filled 2023-10-22 (×3): qty 1

## 2023-10-22 MED ORDER — VITAMIN C 500 MG PO TABS
500.0000 mg | ORAL_TABLET | Freq: Every day | ORAL | Status: DC
  Administered 2023-10-22 – 2023-10-24 (×3): 500 mg via ORAL
  Filled 2023-10-22 (×3): qty 1

## 2023-10-22 NOTE — Plan of Care (Signed)
  Problem: Coping: Goal: Ability to adjust to condition or change in health will improve Outcome: Progressing   Problem: Fluid Volume: Goal: Ability to maintain a balanced intake and output will improve Outcome: Progressing   Problem: Health Behavior/Discharge Planning: Goal: Ability to manage health-related needs will improve Outcome: Progressing

## 2023-10-22 NOTE — Progress Notes (Signed)
 PROGRESS NOTE    MAVI UN  FMW:[id number] DOB: 28-Feb-1940 DOA: 10/18/2023 PCP: Dayna Motto, DO    Brief Narrative:  83 year old with history of dementia, pulmonology, type 2 diabetes on insulin , hypertension, GERD was suffering recently from dysuria and back pain, treated with Keflex .  Urine culture with E. coli.  Brought back to the ER because she was unable to walk due to pain.  In the emergency room hemodynamically stable.  CT scan angiogram chest abdomen pelvis without aneurysm dissection or stenosis.  CT scan of the lumbar spine and repeat CT scan with no acute findings.  Multilevel degenerative disc disease and facet hypertrophy.  She has history of previous surgery on the back.  Subjective: Patient seen and examined.  Patient was laying comfortably in the bed, still has significant lower backache 7/10, feels some improvement after pain medications.  Denies any chest pain or palpitation, no shortness of breath.  Denies any nausea vomiting or abdominal pain.   Assessment & Plan:   # Intractable back pain, acute on chronic back pain with multilevel degenerative disease.  Difficulty mobility. Tylenol  1 g scheduled TID Robaxin 500 mg scheduled Q8 Lidocaine  patch Oxycodone  with bowel regimen Continue to work with PT OT.  Referred to SNF.  # Acute UTI present on admission, completed 7 days of Keflex  therapy.  Pansensitive E. coli.  Chronic medical conditions including Hypothyroidism, on Synthroid  will resume. Type 2 diabetes, well-controlled.  On insulin  and metformin .  Continued.  Holding Jardiance for acute UTI. Hyperlipidemia, on a statin.  Continue. GERD, on PPI. Dementia, fairly stable.  Delirium precautions.  Fall precautions.  Continue Namenda .   # Iron deficiency, Tsat 10% Venofer 300 mg IV x 1 dose given on 10/2 Started oral iron supplement with vitamin C.  Follow-up with PCP to repeat iron profile after 3 to 6 months   DVT prophylaxis: heparin injection 5,000  Units Start: 10/19/23 0600 SCDs Start: 10/19/23 0257   Code Status: Full code Family Communication: None at the bedside Disposition Plan: Status is: Inpatient Remains inpatient appropriate because: Medically stable.  Needs SNF bed. TOC working for SNF placement.  Stable to DC   Consultants:  None  Procedures:  None  Antimicrobials:  Keflex      Objective: Vitals:   10/21/23 2115 10/22/23 0547 10/22/23 1331 10/22/23 1423  BP: (!) 159/77 (!) 153/93 113/64   Pulse: (!) 104 (!) 107 (!) 41 75  Resp: 15 15 18    Temp: 97.8 F (36.6 C) 97.9 F (36.6 C) 98 F (36.7 C)   TempSrc: Oral Oral    SpO2: 97% 97% 95% 95%    Intake/Output Summary (Last 24 hours) at 10/22/2023 1506 Last data filed at 10/22/2023 1300 Gross per 24 hour  Intake 1020 ml  Output 1700 ml  Net -680 ml   There were no vitals filed for this visit.  Examination:  General exam: Appears calm and comfortable.  Pleasant.  Alert awake and mostly oriented but forgetful. Respiratory system: Clear to auscultation. Respiratory effort normal. Cardiovascular system: S1 & S2 heard, RRR. No JVD, murmurs, rubs, gallops or clicks. No pedal edema. Gastrointestinal system: Abdomen is nondistended, soft and nontender. No organomegaly or masses felt. Normal bowel sounds heard. Central nervous system: Alert and oriented. No focal neurological deficits.   Data Reviewed: I have personally reviewed following labs and imaging studies  CBC: Recent Labs  Lab 10/18/23 1956 10/19/23 0352 10/21/23 1007  WBC 7.9 7.5 8.1  HGB 10.5* 9.9* 10.7*  HCT 35.0*  32.2* 36.1  MCV 100.0 99.1 102.0*  PLT 279 244 277   Basic Metabolic Panel: Recent Labs  Lab 10/18/23 1956 10/19/23 0352 10/21/23 1007  NA 137 137 136  K 4.2 4.6 4.4  CL 102 103 101  CO2 21* 21* 23  GLUCOSE 243* 196* 153*  BUN 28* 28* 28*  CREATININE 0.92 0.80  0.78 0.93  CALCIUM  9.8 9.7 9.5  MG  --   --  2.1  PHOS  --   --  3.6   GFR: Estimated Creatinine  Clearance: 56.9 mL/min (by C-G formula based on SCr of 0.93 mg/dL). Liver Function Tests: No results for input(s): AST, ALT, ALKPHOS, BILITOT, PROT, ALBUMIN in the last 168 hours.  No results for input(s): LIPASE, AMYLASE in the last 168 hours. No results for input(s): AMMONIA in the last 168 hours. Coagulation Profile: Recent Labs  Lab 10/19/23 0352  INR 1.0   Cardiac Enzymes: No results for input(s): CKTOTAL, CKMB, CKMBINDEX, TROPONINI in the last 168 hours. BNP (last 3 results) No results for input(s): PROBNP in the last 8760 hours. HbA1C: No results for input(s): HGBA1C in the last 72 hours.  CBG: Recent Labs  Lab 10/21/23 1108 10/21/23 1648 10/21/23 2117 10/22/23 0731 10/22/23 1131  GLUCAP 176* 224* 156* 129* 220*   Lipid Profile: No results for input(s): CHOL, HDL, LDLCALC, TRIG, CHOLHDL, LDLDIRECT in the last 72 hours. Thyroid  Function Tests: No results for input(s): TSH, T4TOTAL, FREET4, T3FREE, THYROIDAB in the last 72 hours. Anemia Panel: Recent Labs    10/21/23 1007  VITAMINB12 3,378*  FOLATE 18.3  TIBC 496*  IRON 52   Sepsis Labs: No results for input(s): PROCALCITON, LATICACIDVEN in the last 168 hours.  Recent Results (from the past 240 hours)  Urine Culture     Status: Abnormal   Collection Time: 10/14/23  3:00 PM   Specimen: Urine, Clean Catch  Result Value Ref Range Status   Specimen Description URINE, CLEAN CATCH  Final   Special Requests NONE  Final   Culture (A)  Final    >=100,000 COLONIES/mL ESCHERICHIA COLI 10,000 COLONIES/mL GROUP B STREP(S.AGALACTIAE)ISOLATED TESTING AGAINST S. AGALACTIAE NOT ROUTINELY PERFORMED DUE TO PREDICTABILITY OF AMP/PEN/VAN SUSCEPTIBILITY. Performed at [facility], [address], [place], [place] [address]    Report Status 10/16/2023 FINAL  Final   Organism ID, Bacteria ESCHERICHIA COLI (A)  Final      Susceptibility   Escherichia coli - MIC*     AMPICILLIN <=2 SENSITIVE Sensitive     CEFAZOLIN  (URINE) Value in next row Sensitive      <=1 SENSITIVEThis is a modified FDA-approved test that has been validated and its performance characteristics determined by the reporting laboratory.  This laboratory is certified under the Clinical Laboratory Improvement Amendments CLIA as qualified to perform high complexity clinical laboratory testing.    CEFEPIME Value in next row Sensitive      <=1 SENSITIVEThis is a modified FDA-approved test that has been validated and its performance characteristics determined by the reporting laboratory.  This laboratory is certified under the Clinical Laboratory Improvement Amendments CLIA as qualified to perform high complexity clinical laboratory testing.    ERTAPENEM Value in next row Sensitive      <=1 SENSITIVEThis is a modified FDA-approved test that has been validated and its performance characteristics determined by the reporting laboratory.  This laboratory is certified under the Clinical Laboratory Improvement Amendments CLIA as qualified to perform high complexity clinical laboratory testing.    CEFTRIAXONE  Value in next row Sensitive      <=1 SENSITIVEThis is a modified FDA-approved test that has been validated and its performance characteristics determined by the reporting laboratory.  This laboratory is certified under the Clinical Laboratory Improvement Amendments CLIA as qualified to perform high complexity clinical laboratory testing.    CIPROFLOXACIN Value in next row Sensitive      <=1 SENSITIVEThis is a modified FDA-approved test that has been validated and its performance characteristics determined by the reporting laboratory.  This laboratory is certified under the Clinical Laboratory Improvement Amendments CLIA as qualified to perform high complexity clinical laboratory testing.    GENTAMICIN Value in next row Sensitive      <=1 SENSITIVEThis is a modified FDA-approved test that has been validated  and its performance characteristics determined by the reporting laboratory.  This laboratory is certified under the Clinical Laboratory Improvement Amendments CLIA as qualified to perform high complexity clinical laboratory testing.    NITROFURANTOIN Value in next row Sensitive      <=1 SENSITIVEThis is a modified FDA-approved test that has been validated and its performance characteristics determined by the reporting laboratory.  This laboratory is certified under the Clinical Laboratory Improvement Amendments CLIA as qualified to perform high complexity clinical laboratory testing.    TRIMETH/SULFA Value in next row Sensitive      <=1 SENSITIVEThis is a modified FDA-approved test that has been validated and its performance characteristics determined by the reporting laboratory.  This laboratory is certified under the Clinical Laboratory Improvement Amendments CLIA as qualified to perform high complexity clinical laboratory testing.    AMPICILLIN/SULBACTAM Value in next row Sensitive      <=1 SENSITIVEThis is a modified FDA-approved test that has been validated and its performance characteristics determined by the reporting laboratory.  This laboratory is certified under the Clinical Laboratory Improvement Amendments CLIA as qualified to perform high complexity clinical laboratory testing.    PIP/TAZO Value in next row Sensitive      <=4 SENSITIVEThis is a modified FDA-approved test that has been validated and its performance characteristics determined by the reporting laboratory.  This laboratory is certified under the Clinical Laboratory Improvement Amendments CLIA as qualified to perform high complexity clinical laboratory testing.    MEROPENEM Value in next row Sensitive      <=4 SENSITIVEThis is a modified FDA-approved test that has been validated and its performance characteristics determined by the reporting laboratory.  This laboratory is certified under the Clinical Laboratory Improvement  Amendments CLIA as qualified to perform high complexity clinical laboratory testing.    * >=100,000 COLONIES/mL ESCHERICHIA COLI      Radiology Studies: No results found.   Scheduled Meds:  acetaminophen   1,000 mg Oral TID   ascorbic acid  500 mg Oral Daily   cephALEXin   500 mg Oral QID   cholecalciferol   1,000 Units Oral Weekly   donepezil   10 mg Oral QHS   escitalopram  10 mg Oral Daily   heparin  5,000 Units Subcutaneous Q8H   insulin  aspart  0-6 Units Subcutaneous TID WC   insulin  glargine  35 Units Subcutaneous Daily   iron polysaccharides  150 mg Oral Daily   levothyroxine   75 mcg Oral QAC breakfast   lidocaine   1 patch Transdermal Q24H   memantine   10 mg Oral BID   metFORMIN   1,000 mg Oral BID WC   methocarbamol  500 mg Oral TID   pantoprazole   40 mg Oral Daily   polyethylene glycol  17  g Oral Daily   Continuous Infusions:   LOS: 3 days    Time spent: 35 minutes    Elvan Sor, MD [facility]

## 2023-10-22 NOTE — TOC Progression Note (Addendum)
 Transition of Care [facility]) - Progression Note    Patient Details  Name: Jennifer Boyer MRN: [id number] Date of Birth: 13-Jul-1940  [facility] CM/SW Contact  [name], RN Phone Number: 10/22/2023, 1:59 PM  Clinical Narrative:   Short term rehab/SNF bed offers [facility] [place]) emailed to patient's dtr per her request at [email or web address], await bed choice. Will need insurance auth.   -4:00pm Call from patient's dtr, [name], informed NCM patient effective with [facility] 10/20/23, Subscriber ID# [id number]. NCM requested dtr to provide copy of new insurance card to hospital business office, voiced understanding. Sherlean confirmed accepted bed offer at [facility]. SNF auth initiated via [employer]. Auth ID [id number], team notfiied.     Expected Discharge Plan: Skilled Nursing Facility Barriers to Discharge: Continued Medical Work up               Expected Discharge Plan and Services In-house Referral: NA Discharge Planning Services: NA Post Acute Care Choice: Home Health Living arrangements for the past 2 months: Independent Living Facility ([facility])                 DME Arranged: N/A DME Agency: NA       HH Arranged: PT HH Agency: [facility] Date HH Agency Contacted: 10/19/23 Time HH Agency Contacted: 1354 Representative spoke with at [facility] Agency: Jon   Social Drivers of Health (SDOH) Interventions SDOH Screenings   Food Insecurity: No Food Insecurity (10/19/2023)  Housing: Unknown (10/19/2023)  Transportation Needs: No Transportation Needs (10/19/2023)  Utilities: Not At Risk (10/19/2023)  Social Connections: Moderately Isolated (10/19/2023)  Tobacco Use: Low Risk  (10/19/2023)    Readmission Risk Interventions    10/21/2023   12:18 PM 10/19/2023    1:53 PM  Readmission Risk Prevention Plan  Transportation Screening Complete Complete  PCP or Specialist Appt within 5-7 Days Complete Complete  Home  Care Screening Complete Complete  Medication Review (RN CM) Complete Complete

## 2023-10-23 DIAGNOSIS — M5459 Other low back pain: Secondary | ICD-10-CM | POA: Diagnosis not present

## 2023-10-23 LAB — GLUCOSE, CAPILLARY
Glucose-Capillary: 102 mg/dL — ABNORMAL HIGH (ref 70–99)
Glucose-Capillary: 135 mg/dL — ABNORMAL HIGH (ref 70–99)
Glucose-Capillary: 237 mg/dL — ABNORMAL HIGH (ref 70–99)
Glucose-Capillary: 132 mg/dL — ABNORMAL HIGH (ref 70–99)

## 2023-10-23 NOTE — Progress Notes (Signed)
 PROGRESS NOTE  AILYNN Boyer FMW:[id number] DOB: 10-06-1940 DOA: 10/18/2023 PCP: Dayna Motto, DO   LOS: 4 days   Brief narrative:  Patient is a 83 year old with history of dementia, type 2 diabetes melitis on insulin , hypertension, GERD with history of dysuria and back pain who was recently treated with Keflex .  Urine culture with E. coli.  Brought back to the ER because she was unable to walk due to pain.  In the ED, patient was hemodynamically stable.  CT scan angiogram of chest abdomen pelvis without aneurysm dissection or stenosis.  CT scan of the lumbar spine  with no acute findings.  Multilevel degenerative disc disease and facet hypertrophy.  She has history of previous surgery on the back.    Assessment/Plan: Principal Problem:   Intractable low back pain Active Problems:   DKA, type 2 (HCC)   Hypothyroidism   HTN (hypertension)   Intractable back pain, acute on chronic back pain with multilevel degenerative disease.  Has ambulatory dysfunction.  Continue Tylenol , Robaxin and lidocaine  and oxycodone .  Continue PT OT.  PT OT recommends skilled nursing facility.   Acute E. coli UTI present on admission, completed 7 days of Keflex  therapy.     Hypothyroidism, on Synthroid    Type 2 diabetes, well-controlled.  On insulin  and metformin .  Continued.   Jardiance on hold for acute UTI.  Hyperlipidemia, on a statin.  Continue.  GERD, on PPI.  Dementia, continue Namenda .  Stable.  Continue delirium precautions   Iron deficiency, Tsat 10%. Patient received Venofer 300 mg IV x 1 dose given on 10/2. Started oral iron supplement with vitamin C  Deconditioning debility ambulatory dysfunction.  Patient has been seen by PT and recommended skilled nursing facility placement on discharge.   DVT prophylaxis: heparin injection 5,000 Units Start: 10/19/23 0600 SCDs Start: 10/19/23 0257   Disposition: Skilled nursing facility.  Patient is medically stable for disposition.  Status is:  Inpatient Remains inpatient appropriate because: Need for rehabilitation    Code Status:     Code Status: Full Code  Family Communication: None at bedside  Consultants: None  Procedures: None  Anti-infectives:  Fluids  Anti-infectives (From admission, onward)    Start     Dose/Rate Route Frequency Ordered Stop   10/19/23 1000  cephALEXin  (KEFLEX ) capsule 500 mg        500 mg Oral 4 times daily 10/19/23 0803 10/23/23 2359        Subjective: Today, patient was seen and examined at bedside.  Patient states that she has some nausea and mild back pain but otherwise okay.  Objective: Vitals:   10/22/23 2118 10/23/23 0615  BP: (!) 140/83 101/81  Pulse: (!) 106 97  Resp: 18 18  Temp: 97.9 F (36.6 C) 97.9 F (36.6 C)  SpO2: 95% 96%    Intake/Output Summary (Last 24 hours) at 10/23/2023 1500 Last data filed at 10/23/2023 0900 Gross per 24 hour  Intake 1440 ml  Output 2100 ml  Net -660 ml   There were no vitals filed for this visit. There is no height or weight on file to calculate BMI.   Physical Exam: GENERAL: Patient is alert awake and oriented. Not in obvious distress.  Elderly female, Communicative, HENT: No scleral pallor or icterus. Pupils equally reactive to light. Oral mucosa is moist NECK: is supple, no gross swelling noted. CHEST: Clear to auscultation. No crackles or wheezes.   CVS: S1 and S2 heard, no murmur. Regular rate and rhythm.  ABDOMEN: Soft,  non-tender, bowel sounds are present. EXTREMITIES: No edema. CNS: Cranial nerves are intact. No focal motor deficits. SKIN: warm and dry without rashes.  Data Review: I have personally reviewed the following laboratory data and studies,  CBC: Recent Labs  Lab 10/18/23 1956 10/19/23 0352 10/21/23 1007  WBC 7.9 7.5 8.1  HGB 10.5* 9.9* 10.7*  HCT 35.0* 32.2* 36.1  MCV 100.0 99.1 102.0*  PLT 279 244 277   Basic Metabolic Panel: Recent Labs  Lab 10/18/23 1956 10/19/23 0352 10/21/23 1007  NA  137 137 136  K 4.2 4.6 4.4  CL 102 103 101  CO2 21* 21* 23  GLUCOSE 243* 196* 153*  BUN 28* 28* 28*  CREATININE 0.92 0.80  0.78 0.93  CALCIUM  9.8 9.7 9.5  MG  --   --  2.1  PHOS  --   --  3.6   Liver Function Tests: No results for input(s): AST, ALT, ALKPHOS, BILITOT, PROT, ALBUMIN in the last 168 hours. No results for input(s): LIPASE, AMYLASE in the last 168 hours. No results for input(s): AMMONIA in the last 168 hours. Cardiac Enzymes: No results for input(s): CKTOTAL, CKMB, CKMBINDEX, TROPONINI in the last 168 hours. BNP (last 3 results) No results for input(s): BNP in the last 8760 hours.  ProBNP (last 3 results) No results for input(s): PROBNP in the last 8760 hours.  CBG: Recent Labs  Lab 10/22/23 1131 10/22/23 1638 10/22/23 2129 10/23/23 0812 10/23/23 1144  GLUCAP 220* 104* 160* 102* 135*   Recent Results (from the past 240 hours)  Urine Culture     Status: Abnormal   Collection Time: 10/14/23  3:00 PM   Specimen: Urine, Clean Catch  Result Value Ref Range Status   Specimen Description URINE, CLEAN CATCH  Final   Special Requests NONE  Final   Culture (A)  Final    >=100,000 COLONIES/mL ESCHERICHIA COLI 10,000 COLONIES/mL GROUP B STREP(S.AGALACTIAE)ISOLATED TESTING AGAINST S. AGALACTIAE NOT ROUTINELY PERFORMED DUE TO PREDICTABILITY OF AMP/PEN/VAN SUSCEPTIBILITY. Performed at [facility], [address], [place], [place] [address]    Report Status 10/16/2023 FINAL  Final   Organism ID, Bacteria ESCHERICHIA COLI (A)  Final      Susceptibility   Escherichia coli - MIC*    AMPICILLIN <=2 SENSITIVE Sensitive     CEFAZOLIN  (URINE) Value in next row Sensitive      <=1 SENSITIVEThis is a modified FDA-approved test that has been validated and its performance characteristics determined by the reporting laboratory.  This laboratory is certified under the Clinical Laboratory Improvement Amendments CLIA as qualified to perform high  complexity clinical laboratory testing.    CEFEPIME Value in next row Sensitive      <=1 SENSITIVEThis is a modified FDA-approved test that has been validated and its performance characteristics determined by the reporting laboratory.  This laboratory is certified under the Clinical Laboratory Improvement Amendments CLIA as qualified to perform high complexity clinical laboratory testing.    ERTAPENEM Value in next row Sensitive      <=1 SENSITIVEThis is a modified FDA-approved test that has been validated and its performance characteristics determined by the reporting laboratory.  This laboratory is certified under the Clinical Laboratory Improvement Amendments CLIA as qualified to perform high complexity clinical laboratory testing.    CEFTRIAXONE Value in next row Sensitive      <=1 SENSITIVEThis is a modified FDA-approved test that has been validated and its performance characteristics determined by the reporting laboratory.  This laboratory is certified  under the Clinical Laboratory Improvement Amendments CLIA as qualified to perform high complexity clinical laboratory testing.    CIPROFLOXACIN Value in next row Sensitive      <=1 SENSITIVEThis is a modified FDA-approved test that has been validated and its performance characteristics determined by the reporting laboratory.  This laboratory is certified under the Clinical Laboratory Improvement Amendments CLIA as qualified to perform high complexity clinical laboratory testing.    GENTAMICIN Value in next row Sensitive      <=1 SENSITIVEThis is a modified FDA-approved test that has been validated and its performance characteristics determined by the reporting laboratory.  This laboratory is certified under the Clinical Laboratory Improvement Amendments CLIA as qualified to perform high complexity clinical laboratory testing.    NITROFURANTOIN Value in next row Sensitive      <=1 SENSITIVEThis is a modified FDA-approved test that has been validated  and its performance characteristics determined by the reporting laboratory.  This laboratory is certified under the Clinical Laboratory Improvement Amendments CLIA as qualified to perform high complexity clinical laboratory testing.    TRIMETH/SULFA Value in next row Sensitive      <=1 SENSITIVEThis is a modified FDA-approved test that has been validated and its performance characteristics determined by the reporting laboratory.  This laboratory is certified under the Clinical Laboratory Improvement Amendments CLIA as qualified to perform high complexity clinical laboratory testing.    AMPICILLIN/SULBACTAM Value in next row Sensitive      <=1 SENSITIVEThis is a modified FDA-approved test that has been validated and its performance characteristics determined by the reporting laboratory.  This laboratory is certified under the Clinical Laboratory Improvement Amendments CLIA as qualified to perform high complexity clinical laboratory testing.    PIP/TAZO Value in next row Sensitive      <=4 SENSITIVEThis is a modified FDA-approved test that has been validated and its performance characteristics determined by the reporting laboratory.  This laboratory is certified under the Clinical Laboratory Improvement Amendments CLIA as qualified to perform high complexity clinical laboratory testing.    MEROPENEM Value in next row Sensitive      <=4 SENSITIVEThis is a modified FDA-approved test that has been validated and its performance characteristics determined by the reporting laboratory.  This laboratory is certified under the Clinical Laboratory Improvement Amendments CLIA as qualified to perform high complexity clinical laboratory testing.    * >=100,000 COLONIES/mL ESCHERICHIA COLI     Studies: No results found.    Davena Julian, MD  [facility] 10/23/2023  If 7PM-7AM, please contact night-coverage

## 2023-10-23 NOTE — TOC Progression Note (Signed)
 Transition of Care [facility]) - Progression Note    Patient Details  Name: Jennifer Boyer MRN: [id number] Date of Birth: [birth year]  [facility]) CM/SW Contact  Heather DELENA Saltness, LCSW Phone Number: 10/23/2023, 3:19 PM  Clinical Narrative:    Pt's insurance authorization for [facility] has been approved. Auth ID: [id number]. CSW notified Logan at [facility] who reports pt can admit to facility tomorrow.   Expected Discharge Plan: Skilled Nursing Facility Barriers to Discharge: Continued Medical Work up   Expected Discharge Plan and Services In-house Referral: NA Discharge Planning Services: NA Post Acute Care Choice: Home Health Living arrangements for the past 2 months: Independent Living Facility ([facility])                 DME Arranged: N/A DME Agency: NA       HH Arranged: PT HH Agency: [facility] Date HH Agency Contacted: 10/19/23 Time HH Agency Contacted: [id number] Representative spoke with at [facility] Agency: Jon   Social Drivers of Health (SDOH) Interventions SDOH Screenings   Food Insecurity: No Food Insecurity (10/19/2023)  Housing: Unknown (10/19/2023)  Transportation Needs: No Transportation Needs (10/19/2023)  Utilities: Not At Risk (10/19/2023)  Social Connections: Moderately Isolated (10/19/2023)  Tobacco Use: Low Risk  (10/19/2023)    Readmission Risk Interventions    10/21/2023   12:18 PM 10/19/2023    1:53 PM  Readmission Risk Prevention Plan  Transportation Screening Complete Complete  PCP or Specialist Appt within 5-7 Days Complete Complete  Home Care Screening Complete Complete  Medication Review (RN CM) Complete Complete    Signed: Heather Saltness, MSW, LCSW Clinical Social Worker Inpatient Care Management 10/23/2023 3:20 PM

## 2023-10-24 DIAGNOSIS — M5459 Other low back pain: Secondary | ICD-10-CM | POA: Diagnosis not present

## 2023-10-24 LAB — GLUCOSE, CAPILLARY
Glucose-Capillary: 136 mg/dL — ABNORMAL HIGH (ref 70–99)
Glucose-Capillary: 177 mg/dL — ABNORMAL HIGH (ref 70–99)

## 2023-10-24 MED ORDER — TRAMADOL HCL 50 MG PO TABS: 50.0000 mg | ORAL_TABLET | Freq: Three times a day (TID) | ORAL | 0 refills | Status: AC | PRN

## 2023-10-24 MED ORDER — LIDOCAINE 5 % EX PTCH: 1.0000 | MEDICATED_PATCH | CUTANEOUS | Status: AC

## 2023-10-24 MED ORDER — POLYSACCHARIDE IRON COMPLEX 150 MG PO CAPS: 150.0000 mg | ORAL_CAPSULE | Freq: Every day | ORAL | Status: AC

## 2023-10-24 MED ORDER — POLYETHYLENE GLYCOL 3350 17 G PO PACK: 17.0000 g | PACK | Freq: Every day | ORAL | Status: DC

## 2023-10-24 MED ORDER — METHOCARBAMOL 500 MG PO TABS: 500.0000 mg | ORAL_TABLET | Freq: Three times a day (TID) | ORAL | Status: AC

## 2023-10-24 MED ORDER — TRAMADOL HCL 50 MG PO TABS: 50.0000 mg | ORAL_TABLET | Freq: Three times a day (TID) | ORAL | 0 refills | Status: DC | PRN

## 2023-10-24 MED ORDER — OXYCODONE-ACETAMINOPHEN 5-325 MG PO TABS: 1.0000 | ORAL_TABLET | Freq: Four times a day (QID) | ORAL | 0 refills | Status: DC | PRN

## 2023-10-24 MED ORDER — ASCORBIC ACID 500 MG PO TABS: 500.0000 mg | ORAL_TABLET | Freq: Every day | ORAL | Status: AC

## 2023-10-24 MED ORDER — OXYCODONE-ACETAMINOPHEN 5-325 MG PO TABS: 1.0000 | ORAL_TABLET | Freq: Four times a day (QID) | ORAL | 0 refills | Status: AC | PRN

## 2023-10-24 MED ORDER — ONDANSETRON HCL 4 MG PO TABS: 4.0000 mg | ORAL_TABLET | Freq: Four times a day (QID) | ORAL | Status: AC | PRN

## 2023-10-24 NOTE — Discharge Summary (Signed)
 Physician Discharge Summary  Jennifer Boyer FMW:[id number] DOB: August 02, 1940 DOA: 10/18/2023  PCP: Dayna Motto, DO  Admit date: 10/18/2023 Discharge date: 10/24/2023  Admitted From: Independent living facility  Discharge disposition: SNF   Recommendations for Outpatient Follow-Up:   Follow up with your primary care provider at the skilled nursing facility in 3 to 5 days. Check CBC, BMP, magnesium  in the next visit  Discharge Diagnosis:   Principal Problem:   Intractable low back pain Active Problems:   DKA, type 2 (HCC)   Hypothyroidism   HTN (hypertension)   Discharge Condition: Improved.  Diet recommendation: Low sodium, heart healthy.  Carbohydrate-modified.    Wound care: None.  Code status: Full.   History of Present Illness:   Patient is a 83 year old with history of dementia, type 2 diabetes melitis on insulin , hypertension, GERD with history of dysuria and back pain who was recently treated with Keflex . Urine culture with E. coli. Brought back to the ER because she was unable to walk due to pain. In the ED, patient was hemodynamically stable. CT scan angiogram of chest abdomen pelvis without aneurysm dissection or stenosis. CT scan of the lumbar spine with no acute findings. Multilevel degenerative disc disease and facet hypertrophy. She has history of previous surgery on the back.  Patient was then admitted to hospital for further evaluation and treatment.  Hospital Course:   Following conditions were addressed during hospitalization as listed below,  Intractable back pain, acute on chronic back pain with multilevel degenerative disease.  Has ambulatory dysfunction.  Continue Tylenol , Robaxin and lidocaine  and oxycodone  and tramadol..  Continue PT, OT.  PT OT recommends skilled nursing facility.    Acute E. coli UTI present on admission, completed 7 days of Keflex  therapy.      Hypothyroidism, on Synthroid , will continue on discharge   Type 2 diabetes,  well-controlled.  Continue metformin , insulin  regimen and Jardiance on discharge   Hyperlipidemia, on a statin.  Continue.   GERD, on PPI.   Dementia, continue Namenda .  Stable.  Continue delirium precautions   Iron deficiency, Tsat 10%. Patient received Venofer 300 mg IV x 1 dose given on 10/2. Started oral iron supplement with vitamin C   Deconditioning debility ambulatory dysfunction.  Patient has been seen by PT and recommended skilled nursing facility placement on discharge.    Disposition.  At this time, patient is stable for disposition to skilled nursing facility.  Medical Consultants:   None.  Procedures:    None Subjective:   Today, patient was seen and examined at bedside.  Complains of mild pain in the back.  Denies any nausea vomiting fever chills or rigor.  Discharge Exam:   Vitals:   10/23/23 2049 10/24/23 0458  BP: 134/64 114/75  Pulse: (!) 106 (!) 101  Resp: 18 18  Temp: 98.7 F (37.1 C) 97.6 F (36.4 C)  SpO2: 96% 96%   Vitals:   10/23/23 0615 10/23/23 1837 10/23/23 2049 10/24/23 0458  BP: 101/81 127/67 134/64 114/75  Pulse: 97 (!) 108 (!) 106 (!) 101  Resp: 18 15 18 18   Temp: 97.9 F (36.6 C) 98.5 F (36.9 C) 98.7 F (37.1 C) 97.6 F (36.4 C)  TempSrc: Oral Oral Oral Oral  SpO2: 96% 100% 96% 96%   There is no height or weight on file to calculate BMI.  General: Alert awake, not in obvious distress HENT: pupils equally reacting to light,  No scleral pallor or icterus noted. Oral mucosa is moist.  Chest:  Clear breath sounds.   No crackles or wheezes.  CVS: S1 &S2 heard. No murmur.  Regular rate and rhythm. Abdomen: Soft, nontender, nondistended.  Bowel sounds are heard.   Extremities: No cyanosis, clubbing or edema.  Peripheral pulses are palpable. Psych: Alert, awake and oriented, normal mood CNS:  No cranial nerve deficits.  Moves extremities Skin: Warm and dry.  No rashes noted.  The results of significant diagnostics from this  hospitalization (including imaging, microbiology, ancillary and laboratory) are listed below for reference.     Diagnostic Studies:   CT Angio Chest/Abd/Pel for Dissection W and/or Wo Contrast Result Date: 10/19/2023 CLINICAL DATA:  Back pain worsening over the past week, diagnosed with UTI and placed on antibiotics 3 days ago with worsening pain and urinary frequency despite antibiotics. EXAM: CT ANGIOGRAPHY CHEST, ABDOMEN AND PELVIS TECHNIQUE: Non-contrast CT of the chest was initially obtained. Multidetector CT imaging through the chest, abdomen and pelvis was performed using the standard protocol during bolus administration of intravenous contrast. Multiplanar reconstructed images and MIPs were obtained and reviewed to evaluate the vascular anatomy. RADIATION DOSE REDUCTION: This exam was performed according to the departmental dose-optimization program which includes automated exposure control, adjustment of the mA and/or kV according to patient size and/or use of iterative reconstruction technique. CONTRAST:  OMNIPAQUE IOHEXOL 350 MG/ML SOLN COMPARISON:  CT abdomen and pelvis without contrast earlier today, CT abdomen and pelvis without contrast 10/14/2023. FINDINGS: CTA CHEST FINDINGS Cardiovascular: The pulmonary arteries are opacified more than the aorta. The pulmonary trunk is prominent measuring 3.2 cm. No arterial embolism is seen. The pulmonary veins are normal caliber. The cardiac size is normal. There is no pericardial effusion. There are trace scattered single-vessel calcific plaques in the LAD coronary artery. There is aortic atherosclerosis and tortuosity and scattered calcification in the great vessels without aneurysm, stenosis or dissection. Great vessel branching standard. Mediastinum/Nodes: No enlarged mediastinal, hilar, or axillary lymph nodes. The lower poles of the thyroid  gland, trachea the main bronchi are unremarkable. There is moderate thickening at the EG junction.  Endoscopic follow-up recommended. There is a moderate-sized hiatal hernia, with a small amount of fluid in the hernia sac to the right. Lungs/Pleura: There is diffuse bronchial thickening. Central airways are clear. There are mild biapical reticular scarring changes and mild posterior atelectasis in the lungs without consolidation, effusion or suspicious nodule. No pneumothorax. There is mild subpleural reticulation in the right lung base. Musculoskeletal: Osteopenia with multilevel degenerative discs thoracic spine, thoracic spondylosis. Multilevel interbody ankylosis. No acute or other significant osseous findings. There are bilateral breast implants with partially calcified capsules. No chest wall mass is seen. Review of the MIP images confirms the above findings. CTA ABDOMEN AND PELVIS FINDINGS VASCULAR Aorta: Normal in caliber with mild patchy non stenosing calcifications. No aneurysm or dissection. Celiac: There is a 40% stenosis in the proximal vessel due to compression by the median arcuate ligament of the diaphragm. The remainder is widely patent. SMA: Patent without evidence of aneurysm, dissection, vasculitis or significant stenosis. There are nonstenosing ostial calcific plaques. Renals: Both renal arteries are patent without evidence of aneurysm, dissection, vasculitis, fibromuscular dysplasia or significant stenosis. Both demonstrate nonstenosing ostial calcific plaques. IMA: Patent without evidence of aneurysm, dissection, vasculitis or significant stenosis. Inflow: Patent without evidence of aneurysm, dissection, vasculitis or significant stenosis. There are scattered calcific plaques in both common iliac and both internal iliac arteries. The external iliac arteries are plaque free. Veins: No obvious venous abnormality within the limitations of  this arterial phase study. Review of the MIP images confirms the above findings. NON-VASCULAR Hepatobiliary: There is a calcified granuloma in the dome of the  right lobe. The liver is 20 cm length with mild steatosis without mass enhancement. Gallbladder is absent without significant biliary prominence. Pancreas: Partially atrophic.  No other focal abnormality. Spleen: No abnormality.  No splenomegaly. Adrenals/Urinary Tract: There is no adrenal mass. There are no renal mass enhancement. Perinephric fat stranding is unchanged. There is no urinary stone or obstruction. There is faint perivesical stranding which may be seen with cystitis. There is mild bladder thickening versus underdistention. Stomach/Bowel: There are thickened folds in the stomach. No dilatation or wall thickening of the bowel. An appendix is not seen. Moderate retained stool noted ascending and proximal transverse colon. Descending and sigmoid diverticulosis again is noted without diverticulitis. Lymphatic: No lymphadenopathy is seen. Reproductive: Uterus and bilateral adnexa are unremarkable. Other: Small umbilical fat hernia. No incarcerated hernia. No free fluid or free air. Musculoskeletal: Osteopenia, multilevel degenerative changes. There is concern for a lytic lesion of the anterior superior L1 vertebral body to the right measuring 1.9 cm. This would be an atypical location for a Schmorl's node. This was not seen on the lumbar MRI from 08/01/2017. Lumbar spine MRI recommended without and with contrast. Left hemilaminectomy at L5 is again seen. There are degenerative changes of the hips. Right-sided sacral stimulator wiring noted with right flank implanted power source. Review of the MIP images confirms the above findings. IMPRESSION: 1. Aortic and branch vessel atherosclerosis without aneurysm, dissection or stenosis. 2. Prominent pulmonary trunk but no arterial embolism is seen. 3. Hiatal hernia with moderate thickening at the EG junction. Endoscopic follow-up recommended. 4. Bronchitis. 5. Likely gastritis. 6. Cystitis versus bladder nondistention. 7. Constipation and diverticulosis. 8. 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 9. 1.9 cm lytic lesion suspected of the anterior superior L1 vertebral body to the right. This would be an atypical location for a Schmorl's node. This was not seen on the lumbar MRI from 08/01/2017. Lumbar spine MRI recommended without and with contrast. 10. Osteopenia and degenerative change. Aortic Atherosclerosis (ICD10-I70.0). Electronically Signed   By: Francis Quam M.D.   On: 10/19/2023 01:25   CT L-SPINE NO CHARGE Result Date: 10/18/2023 EXAM: CT OF THE LUMBAR SPINE WITHOUT CONTRAST 10/18/2023 10:56:32 PM TECHNIQUE: CT of the lumbar spine was performed without the administration of intravenous contrast. Multiplanar reformatted images are provided for review. Automated exposure control, iterative reconstruction, and/or weight based adjustment of the mA/kV was utilized to reduce the radiation dose to as low as reasonably achievable. COMPARISON: Lumbar spine CT 4 days ago 10/14/2023 CLINICAL HISTORY: PT states she has been having back pain for over a week, was diagnosed with a possible UTI put on abx on Saturday but the pain has gotten worse and has started having urinary frequency and has started to be incontinent. FINDINGS: BONES AND ALIGNMENT: Normal vertebral body heights. No acute fracture or suspicious bone lesion. Normal alignment. Left hemilaminectomy at L5. Presacral stimulator on the right. DEGENERATIVE CHANGES: Stable multilevel degenerative disc disease and facet hypertrophy with diffuse broad-based disc bulge. SOFT TISSUES: No acute abnormality. IMPRESSION: 1. No acute findings or change from CT 4 days ago. 2. Multilevel degenerative disc disease and facet hypertrophy. 3. Left hemilaminectomy at L5. Electronically signed by: Andrea Gasman MD 10/18/2023 11:17 PM EDT RP Workstation: [id number]   CT Renal Stone Study Result Date:  10/18/2023 EXAM: CT UROGRAM 10/18/2023 10:56:32 PM  TECHNIQUE: CT of the abdomen and pelvis was performed without the administration of intravenous contrast. Multiplanar reformatted images as well as MIP urogram images are provided for review. Automated exposure control, iterative reconstruction, and/or weight based adjustment of the mA/kV was utilized to reduce the radiation dose to as low as reasonably achievable. COMPARISON: CT 10/14/2023, 4 days ago. CLINICAL HISTORY: Abdominal/flank pain, stone suspected. Triage Notes: PT states she has been having back pain for over a week, was diagnosed with a possible UTI put on abx on Saturday but the pain has gotten worse and has started having urinary frequency and has started to be incontinent. FINDINGS: LOWER CHEST: Small hiatal hernia. LIVER: Punctate hepatic granuloma. GALLBLADDER AND BILE DUCTS: Cholecystectomy. No biliary ductal dilatation. SPLEEN: No acute abnormality. PANCREAS: Pancreatic parenchymal atrophy. No ductal dilatation or inflammation. ADRENAL GLANDS: No acute abnormality. KIDNEYS, URETERS AND BLADDER: No renal stones or hydronephrosis. No evidence of renal inflammation. Urinary bladder is minimally distended, normal for degree of extension. GI AND BOWEL: Small hiatal hernia. Colonic diverticulosis without diverticulitis. Moderate colonic stool burden. Fecalization of distal small bowel contents. There is no bowel obstruction. PERITONEUM AND RETROPERITONEUM: No ascites. No free air. VASCULATURE: Aorta is normal in caliber. Aortic atherosclerosis without aneurysm. LYMPH NODES: No lymphadenopathy. REPRODUCTIVE ORGANS: No acute abnormality. BONES AND SOFT TISSUES: Bilateral breast implants. Small fat-containing umbilical hernia. No acute osseous abnormality. Lumbar spine reported separately on lumbar spine reformats. No focal soft tissue abnormality. IMPRESSION: 1. No renal stones or hydronephrosis. No acute intra-abdominal or pelvic abnormality. 2. Diverticulosis without diverticulitis. 3. Aortic  atherosclerosis (ICD10-170.0) Electronically signed by: Andrea Gasman MD 10/18/2023 11:14 PM EDT RP Workstation: [id number]     Labs:   Basic Metabolic Panel: Recent Labs  Lab 10/18/23 1956 10/19/23 0352 10/21/23 1007  NA 137 137 136  K 4.2 4.6 4.4  CL 102 103 101  CO2 21* 21* 23  GLUCOSE 243* 196* 153*  BUN 28* 28* 28*  CREATININE 0.92 0.80  0.78 0.93  CALCIUM  9.8 9.7 9.5  MG  --   --  2.1  PHOS  --   --  3.6   GFR Estimated Creatinine Clearance: 56.9 mL/min (by C-G formula based on SCr of 0.93 mg/dL). Liver Function Tests: No results for input(s): AST, ALT, ALKPHOS, BILITOT, PROT, ALBUMIN in the last 168 hours. No results for input(s): LIPASE, AMYLASE in the last 168 hours. No results for input(s): AMMONIA in the last 168 hours. Coagulation profile Recent Labs  Lab 10/19/23 0352  INR 1.0    CBC: Recent Labs  Lab 10/18/23 1956 10/19/23 0352 10/21/23 1007  WBC 7.9 7.5 8.1  HGB 10.5* 9.9* 10.7*  HCT 35.0* 32.2* 36.1  MCV 100.0 99.1 102.0*  PLT 279 244 277   Cardiac Enzymes: No results for input(s): CKTOTAL, CKMB, CKMBINDEX, TROPONINI in the last 168 hours. BNP: Invalid input(s): POCBNP CBG: Recent Labs  Lab 10/23/23 0812 10/23/23 1144 10/23/23 1629 10/23/23 2115 10/24/23 0734  GLUCAP 102* 135* 237* 132* 136*   D-Dimer No results for input(s): DDIMER in the last 72 hours. Hgb A1c No results for input(s): HGBA1C in the last 72 hours. Lipid Profile No results for input(s): CHOL, HDL, LDLCALC, TRIG, CHOLHDL, LDLDIRECT in the last 72 hours. Thyroid  function studies No results for input(s): TSH, T4TOTAL, T3FREE, THYROIDAB in the last 72 hours.  Invalid input(s): FREET3 Anemia work up No results for input(s): VITAMINB12, FOLATE, FERRITIN, TIBC, IRON, RETICCTPCT in the last 72  hours. Microbiology Recent Results (from the past 240 hours)  Urine Culture     Status: Abnormal    Collection Time: 10/14/23  3:00 PM   Specimen: Urine, Clean Catch  Result Value Ref Range Status   Specimen Description URINE, CLEAN CATCH  Final   Special Requests NONE  Final   Culture (A)  Final    >=100,000 COLONIES/mL ESCHERICHIA COLI 10,000 COLONIES/mL GROUP B STREP(S.AGALACTIAE)ISOLATED TESTING AGAINST S. AGALACTIAE NOT ROUTINELY PERFORMED DUE TO PREDICTABILITY OF AMP/PEN/VAN SUSCEPTIBILITY. Performed at [facility], [address], [place], [place] [address]    Report Status 10/16/2023 FINAL  Final   Organism ID, Bacteria ESCHERICHIA COLI (A)  Final      Susceptibility   Escherichia coli - MIC*    AMPICILLIN <=2 SENSITIVE Sensitive     CEFAZOLIN  (URINE) Value in next row Sensitive      <=1 SENSITIVEThis is a modified FDA-approved test that has been validated and its performance characteristics determined by the reporting laboratory.  This laboratory is certified under the Clinical Laboratory Improvement Amendments CLIA as qualified to perform high complexity clinical laboratory testing.    CEFEPIME Value in next row Sensitive      <=1 SENSITIVEThis is a modified FDA-approved test that has been validated and its performance characteristics determined by the reporting laboratory.  This laboratory is certified under the Clinical Laboratory Improvement Amendments CLIA as qualified to perform high complexity clinical laboratory testing.    ERTAPENEM Value in next row Sensitive      <=1 SENSITIVEThis is a modified FDA-approved test that has been validated and its performance characteristics determined by the reporting laboratory.  This laboratory is certified under the Clinical Laboratory Improvement Amendments CLIA as qualified to perform high complexity clinical laboratory testing.    CEFTRIAXONE Value in next row Sensitive      <=1 SENSITIVEThis is a modified FDA-approved test that has been validated and its performance characteristics determined by the reporting laboratory.  This  laboratory is certified under the Clinical Laboratory Improvement Amendments CLIA as qualified to perform high complexity clinical laboratory testing.    CIPROFLOXACIN Value in next row Sensitive      <=1 SENSITIVEThis is a modified FDA-approved test that has been validated and its performance characteristics determined by the reporting laboratory.  This laboratory is certified under the Clinical Laboratory Improvement Amendments CLIA as qualified to perform high complexity clinical laboratory testing.    GENTAMICIN Value in next row Sensitive      <=1 SENSITIVEThis is a modified FDA-approved test that has been validated and its performance characteristics determined by the reporting laboratory.  This laboratory is certified under the Clinical Laboratory Improvement Amendments CLIA as qualified to perform high complexity clinical laboratory testing.    NITROFURANTOIN Value in next row Sensitive      <=1 SENSITIVEThis is a modified FDA-approved test that has been validated and its performance characteristics determined by the reporting laboratory.  This laboratory is certified under the Clinical Laboratory Improvement Amendments CLIA as qualified to perform high complexity clinical laboratory testing.    TRIMETH/SULFA Value in next row Sensitive      <=1 SENSITIVEThis is a modified FDA-approved test that has been validated and its performance characteristics determined by the reporting laboratory.  This laboratory is certified under the Clinical Laboratory Improvement Amendments CLIA as qualified to perform high complexity clinical laboratory testing.    AMPICILLIN/SULBACTAM Value in next row Sensitive      <=1 SENSITIVEThis is a modified FDA-approved  test that has been validated and its performance characteristics determined by the reporting laboratory.  This laboratory is certified under the Clinical Laboratory Improvement Amendments CLIA as qualified to perform high complexity clinical laboratory  testing.    PIP/TAZO Value in next row Sensitive      <=4 SENSITIVEThis is a modified FDA-approved test that has been validated and its performance characteristics determined by the reporting laboratory.  This laboratory is certified under the Clinical Laboratory Improvement Amendments CLIA as qualified to perform high complexity clinical laboratory testing.    MEROPENEM Value in next row Sensitive      <=4 SENSITIVEThis is a modified FDA-approved test that has been validated and its performance characteristics determined by the reporting laboratory.  This laboratory is certified under the Clinical Laboratory Improvement Amendments CLIA as qualified to perform high complexity clinical laboratory testing.    * >=100,000 COLONIES/mL ESCHERICHIA COLI     Discharge Instructions:   Discharge Instructions     Call MD for:  severe uncontrolled pain   Complete by: As directed    Diet - low sodium heart healthy   Complete by: As directed    Diet Carb Modified   Complete by: As directed    Discharge instructions   Complete by: As directed    Follow-up with your primary care provider in at the skilled nursing facility in 3 to 5 days.  Continue physical therapy.  Check blood work at that time.  Seek medical attention for worsening symptoms.   Increase activity slowly   Complete by: As directed       Allergies as of 10/24/2023   No Known Allergies      Medication List     STOP taking these medications    cephALEXin  500 MG capsule Commonly known as: KEFLEX    methylPREDNISolone  4 MG Tbpk tablet Commonly known as: MEDROL  DOSEPAK       TAKE these medications    acetaminophen  500 MG tablet Commonly known as: TYLENOL  Take 500-1,000 mg by mouth every 6 (six) hours as needed for moderate pain.   Admelog SoloStar 100 UNIT/ML KwikPen Generic drug: insulin  lispro Inject into the skin.   ascorbic acid 500 MG tablet Commonly known as: VITAMIN C Take 1 tablet (500 mg total) by mouth  daily.   Basaglar  KwikPen 100 UNIT/ML Inject 55 Units into the skin daily. What changed: how much to take   cholecalciferol  1000 units tablet Commonly known as: VITAMIN D  Take 1,000 Units by mouth once a week.   donepezil  10 MG tablet Commonly known as: ARICEPT  Take 1 tablet (10 mg total) by mouth at bedtime.   escitalopram 10 MG tablet Commonly known as: LEXAPRO Take 10 mg by mouth daily.   insulin  aspart 100 UNIT/ML FlexPen Commonly known as: NOVOLOG  Before each meal 3 times a day, 140-199 - 4 units, 200-250 - 6 units, 251-299 - 8 units,  300-349 - 10 units,  350 or above 12 units. Insulin  PEN if approved, provide syringes and needles if needed.   iron polysaccharides 150 MG capsule Commonly known as: NIFEREX Take 1 capsule (150 mg total) by mouth daily.   Jardiance 10 MG Tabs tablet Generic drug: empagliflozin Take 10 mg by mouth daily.   levothyroxine  75 MCG tablet Commonly known as: SYNTHROID  Take 75 mcg by mouth daily before breakfast.   lidocaine  5 % Commonly known as: LIDODERM  Place 1 patch onto the skin daily. Remove & Discard patch within 12 hours or as directed by MDApply to  back Apply to intact skin to cover the most painful area. Apply the prescribed number of patches (maximum of 3), for up to 12 hours within a 24 hour period.   memantine  10 MG tablet Commonly known as: NAMENDA  Take 1 tablet (10 mg total) by mouth 2 (two) times daily.   metFORMIN  1000 MG tablet Commonly known as: GLUCOPHAGE  Take 1,000 mg by mouth 2 (two) times daily with a meal.   methocarbamol 500 MG tablet Commonly known as: ROBAXIN Take 1 tablet (500 mg total) by mouth 3 (three) times daily for 10 days.   ondansetron  4 MG tablet Commonly known as: ZOFRAN  Take 1 tablet (4 mg total) by mouth every 6 (six) hours as needed for nausea.   oxyCODONE -acetaminophen  5-325 MG tablet Commonly known as: PERCOCET/ROXICET Take 1 tablet by mouth every 6 (six) hours as needed for severe pain  (pain score 7-10).   pantoprazole  40 MG tablet Commonly known as: PROTONIX  Take 40 mg by mouth daily.   phenazopyridine 200 MG tablet Commonly known as: PYRIDIUM Take 1 tablet (200 mg total) by mouth 3 (three) times daily. What changed: Another medication with the same name was removed. Continue taking this medication, and follow the directions you see here.   polyethylene glycol 17 g packet Commonly known as: MIRALAX / GLYCOLAX Take 17 g by mouth daily.   rosuvastatin  20 MG tablet Commonly known as: CRESTOR  Take 20 mg by mouth daily.   traMADol 50 MG tablet Commonly known as: ULTRAM Take 1 tablet (50 mg total) by mouth 3 (three) times daily as needed for moderate pain (pain score 4-6) (mild pain).        Contact information for follow-up providers     [facility] [place], [place] Follow up.   Contact information: [address] [place] [address] [phone]              Contact information for after-discharge care     Destination     [facility] .   Service: Skilled Nursing Contact information: [address] [place]  [address] [phone]                      Time coordinating discharge: 39 minutes  Signed:  Mack Alvidrez  [facility] 10/24/2023, 10:20 AM

## 2023-10-24 NOTE — Plan of Care (Signed)
  Problem: Education: Goal: Ability to describe self-care measures that may prevent or decrease complications (Diabetes Survival Skills Education) will improve Outcome: Adequate for Discharge Goal: Individualized Educational Video(s) Outcome: Adequate for Discharge   Problem: Coping: Goal: Ability to adjust to condition or change in health will improve Outcome: Adequate for Discharge   Problem: Fluid Volume: Goal: Ability to maintain a balanced intake and output will improve Outcome: Adequate for Discharge   Problem: Health Behavior/Discharge Planning: Goal: Ability to identify and utilize available resources and services will improve Outcome: Adequate for Discharge Goal: Ability to manage health-related needs will improve Outcome: Adequate for Discharge   Problem: Metabolic: Goal: Ability to maintain appropriate glucose levels will improve Outcome: Adequate for Discharge   Problem: Nutritional: Goal: Progress toward achieving an optimal weight will improve Outcome: Adequate for Discharge

## 2023-10-24 NOTE — TOC Transition Note (Signed)
 Transition of Care [facility]) - Discharge Note   Patient Details  Name: Jennifer Boyer MRN: [id number] Date of Birth: 1940-07-22  Transition of [facility]) CM/SW Contact:  Jon ONEIDA Anon, RN Phone Number: 10/24/2023, 2:01 PM   Clinical Narrative:    Pt to discharge to [facility] for STR. Pt/ pt daughter agreeable to DC plan. Pt will need transportation via [facility]. [facility] called at [phone]. DC packet placed placed at RN station, with scripts inside. RN given number to call report to [phone] to room 301. There are no further ICM needs at this time.     Final next level of care: Skilled Nursing Facility Barriers to Discharge: Barriers Resolved   Patient Goals and CMS Choice Patient states their goals for this hospitalization and ongoing recovery are:: To go to [facility] for STR CMS Medicare.gov Compare Post Acute Care list provided to:: Patient Represenative (must comment) (Buchholz,[name] (Daughter)  [phone] (Mobile)) Choice offered to / list presented to : Adult Children [place] ownership interest in [facility].provided to:: Adult Children    Discharge Placement              Patient chooses bed at: [facility] Patient to be transferred to facility by: [facility] Name of family member notified: Blair Handler (Daughter)  [phone] Patient and family notified of of transfer: 10/24/23  Discharge Plan and Services Additional resources added to the After Visit Summary for   In-house Referral: NA Discharge Planning Services: NA Post Acute Care Choice: Home Health          DME Arranged: N/A DME Agency: NA       HH Arranged: PT HH Agency: [facility] Date [facility] Agency Contacted: 10/19/23 Time HH Agency Contacted: 1354 Representative spoke with at [facility] Agency: Jon  Social Drivers of Health (SDOH) Interventions SDOH Screenings   Food Insecurity: No Food Insecurity (10/19/2023)  Housing: Unknown (10/19/2023)  Transportation Needs: No Transportation  Needs (10/19/2023)  Utilities: Not At Risk (10/19/2023)  Social Connections: Moderately Isolated (10/19/2023)  Tobacco Use: Low Risk  (10/19/2023)     Readmission Risk Interventions    10/21/2023   12:18 PM 10/19/2023    1:53 PM  Readmission Risk Prevention Plan  Transportation Screening Complete Complete  PCP or Specialist Appt within 5-7 Days Complete Complete  Home Care Screening Complete Complete  Medication Review (RN CM) Complete Complete

## 2023-11-10 DIAGNOSIS — R0609 Other forms of dyspnea: Secondary | ICD-10-CM | POA: Diagnosis not present

## 2023-11-10 DIAGNOSIS — M899 Disorder of bone, unspecified: Secondary | ICD-10-CM | POA: Diagnosis not present

## 2023-11-10 DIAGNOSIS — R000 Tachycardia, unspecified: Secondary | ICD-10-CM | POA: Diagnosis not present

## 2023-11-10 DIAGNOSIS — G8929 Other chronic pain: Secondary | ICD-10-CM | POA: Diagnosis not present

## 2023-11-10 DIAGNOSIS — K449 Diaphragmatic hernia without obstruction or gangrene: Secondary | ICD-10-CM | POA: Diagnosis not present

## 2023-11-10 DIAGNOSIS — M545 Low back pain, unspecified: Secondary | ICD-10-CM | POA: Diagnosis not present

## 2023-11-10 DIAGNOSIS — Z09 Encounter for follow-up examination after completed treatment for conditions other than malignant neoplasm: Secondary | ICD-10-CM | POA: Diagnosis not present

## 2023-11-11 ENCOUNTER — Other Ambulatory Visit: Payer: Self-pay | Admitting: Family Medicine

## 2023-11-11 DIAGNOSIS — M899 Disorder of bone, unspecified: Secondary | ICD-10-CM

## 2023-11-12 DIAGNOSIS — M1990 Unspecified osteoarthritis, unspecified site: Secondary | ICD-10-CM | POA: Diagnosis not present

## 2023-11-12 DIAGNOSIS — Z09 Encounter for follow-up examination after completed treatment for conditions other than malignant neoplasm: Secondary | ICD-10-CM | POA: Diagnosis not present

## 2023-11-12 DIAGNOSIS — M545 Low back pain, unspecified: Secondary | ICD-10-CM | POA: Diagnosis not present

## 2023-11-26 ENCOUNTER — Encounter: Payer: Self-pay | Admitting: *Deleted

## 2023-11-30 ENCOUNTER — Ambulatory Visit: Attending: Physician Assistant | Admitting: Physician Assistant

## 2023-11-30 NOTE — Progress Notes (Deleted)
    OFFICE NOTE:    Date:  11/30/2023  ID:  Jennifer Boyer, DOB 12/10/1940, MRN [id number] PCP: Dayna Motto, DO  [facility] Providers Cardiologist:  None { Click to update primary MD,subspecialty MD or APP then REFRESH:1}       Coronary artery calcification Diabetes mellitus, insulin  dependent Hypothyroidism Hyperlipidemia Gastroesophageal reflux disease Fibromyalgia Sjogren's syndrome Degenerative disc disease Dementia Iron deficiency anemia Aortic atherosclerosis        Discussed the use of AI scribe software for clinical note transcription with the patient, who gave verbal consent to proceed. History of Present Illness Jennifer Boyer is a 83 y.o. female referred by Dayna Motto, DO for shortness of breath with exertion.  She was recently admitted to [facility] from September 29 to October 5 for low back pain and was unable to ambulate. She was managed with pain control, physical therapy, and occupational therapy. During this admission, she was also treated for an E. coli urinary tract infection.  She had follow up w PCP 10/22 and noted episodes of shortness of breath, particularly with exertion, sometimes accompanied by nausea. She was noted to be tachycardic during her follow-up on November 10, 2023, with an EKG showing sinus rhythm and a heart rate of 90 (EKG not available for review).     ROS-See HPI***    Studies Reviewed:      LABS - Chart Review Potassium: 4.4 (10/21/2023) Creatinine: 0.93 (10/21/2023) Magnesium : 2.1 (10/21/2023) GFR: >60 (10/21/2023) ALT: 9 (10/14/2023) Hemoglobin: 10.7 (10/21/2023) Platelet count: 277,000 (10/21/2023) Hemoglobin A1c: 7.8 (10/19/2023)  RADIOLOGY CT: multilevel degenerative disc disease and facet hypertrophy CT angiography chest abdomen pelvis: aortic and branch vessel atherosclerosis without aneurysm, dissection, or stenosis. Coronary artery calcification in the LAD (10/19/2023)  Results  Risk  Assessment/Calculations: {Does this patient have ATRIAL FIBRILLATION?[phone]} No BP recorded.  {Refresh Note OR Click here to enter BP  :1}***      Physical Exam:  VS:  There were no vitals taken for this visit.       Wt Readings from Last 3 Encounters:  10/14/23 230 lb (104.3 kg)  03/15/23 214 lb 9.6 oz (97.3 kg)  06/16/22 205 lb (93 kg)    Physical Exam***     Assessment and Plan:    Assessment & Plan DOE (dyspnea on exertion)  Assessment and Plan Assessment & Plan    {      :1}    {Are you ordering a CV Procedure (e.g. stress test, cath, DCCV, TEE, etc)?   Press F2        :[id number]}  Dispo:  No follow-ups on file.  Signed, Glendia Ferrier, PA-C

## 2023-12-08 ENCOUNTER — Ambulatory Visit: Admitting: Podiatry

## 2023-12-14 ENCOUNTER — Ambulatory Visit: Admitting: Podiatry

## 2024-01-16 ENCOUNTER — Other Ambulatory Visit: Payer: Self-pay

## 2024-01-16 ENCOUNTER — Inpatient Hospital Stay (HOSPITAL_COMMUNITY)
Admission: EM | Admit: 2024-01-16 | Discharge: 2024-01-21 | DRG: 673 | Disposition: A | Discharge status: Home / Self Care | Attending: Internal Medicine | Admitting: Internal Medicine

## 2024-01-16 ENCOUNTER — Emergency Department (HOSPITAL_COMMUNITY)

## 2024-01-16 DIAGNOSIS — D649 Anemia, unspecified: Secondary | ICD-10-CM | POA: Diagnosis present

## 2024-01-16 DIAGNOSIS — F0390 Unspecified dementia without behavioral disturbance: Secondary | ICD-10-CM | POA: Diagnosis present

## 2024-01-16 DIAGNOSIS — Z66 Do not resuscitate: Secondary | ICD-10-CM | POA: Diagnosis present

## 2024-01-16 DIAGNOSIS — G9589 Other specified diseases of spinal cord: Secondary | ICD-10-CM | POA: Diagnosis present

## 2024-01-16 DIAGNOSIS — M479 Spondylosis, unspecified: Secondary | ICD-10-CM | POA: Diagnosis present

## 2024-01-16 DIAGNOSIS — N390 Urinary tract infection, site not specified: Principal | ICD-10-CM | POA: Diagnosis present

## 2024-01-16 DIAGNOSIS — I6782 Cerebral ischemia: Secondary | ICD-10-CM | POA: Diagnosis present

## 2024-01-16 DIAGNOSIS — K219 Gastro-esophageal reflux disease without esophagitis: Secondary | ICD-10-CM | POA: Diagnosis present

## 2024-01-16 DIAGNOSIS — G9341 Metabolic encephalopathy: Secondary | ICD-10-CM | POA: Diagnosis present

## 2024-01-16 DIAGNOSIS — D539 Nutritional anemia, unspecified: Secondary | ICD-10-CM | POA: Diagnosis present

## 2024-01-16 DIAGNOSIS — Z79890 Hormone replacement therapy (postmenopausal): Secondary | ICD-10-CM

## 2024-01-16 DIAGNOSIS — Z9013 Acquired absence of bilateral breasts and nipples: Secondary | ICD-10-CM

## 2024-01-16 DIAGNOSIS — C9030 Solitary plasmacytoma not having achieved remission: Secondary | ICD-10-CM | POA: Diagnosis present

## 2024-01-16 DIAGNOSIS — M8448XA Pathological fracture, other site, initial encounter for fracture: Secondary | ICD-10-CM | POA: Diagnosis present

## 2024-01-16 DIAGNOSIS — D638 Anemia in other chronic diseases classified elsewhere: Secondary | ICD-10-CM | POA: Diagnosis present

## 2024-01-16 DIAGNOSIS — E785 Hyperlipidemia, unspecified: Secondary | ICD-10-CM | POA: Diagnosis present

## 2024-01-16 DIAGNOSIS — I10 Essential (primary) hypertension: Secondary | ICD-10-CM | POA: Diagnosis present

## 2024-01-16 DIAGNOSIS — R4189 Other symptoms and signs involving cognitive functions and awareness: Secondary | ICD-10-CM | POA: Diagnosis present

## 2024-01-16 DIAGNOSIS — M797 Fibromyalgia: Secondary | ICD-10-CM | POA: Diagnosis present

## 2024-01-16 DIAGNOSIS — I951 Orthostatic hypotension: Secondary | ICD-10-CM | POA: Diagnosis present

## 2024-01-16 DIAGNOSIS — E039 Hypothyroidism, unspecified: Secondary | ICD-10-CM | POA: Diagnosis present

## 2024-01-16 DIAGNOSIS — E1165 Type 2 diabetes mellitus with hyperglycemia: Secondary | ICD-10-CM | POA: Diagnosis present

## 2024-01-16 DIAGNOSIS — R410 Disorientation, unspecified: Principal | ICD-10-CM

## 2024-01-16 DIAGNOSIS — E66812 Obesity, class 2: Secondary | ICD-10-CM | POA: Diagnosis present

## 2024-01-16 DIAGNOSIS — E860 Dehydration: Secondary | ICD-10-CM | POA: Diagnosis present

## 2024-01-16 DIAGNOSIS — Z9049 Acquired absence of other specified parts of digestive tract: Secondary | ICD-10-CM

## 2024-01-16 DIAGNOSIS — Z794 Long term (current) use of insulin: Secondary | ICD-10-CM

## 2024-01-16 DIAGNOSIS — Z79899 Other long term (current) drug therapy: Secondary | ICD-10-CM

## 2024-01-16 DIAGNOSIS — Z6837 Body mass index (BMI) 37.0-37.9, adult: Secondary | ICD-10-CM

## 2024-01-16 DIAGNOSIS — Z7984 Long term (current) use of oral hypoglycemic drugs: Secondary | ICD-10-CM

## 2024-01-16 LAB — CBG MONITORING, ED: Glucose-Capillary: 304 mg/dL — ABNORMAL HIGH (ref 70–99)

## 2024-01-16 MED ORDER — LACTATED RINGERS IV BOLUS
500.0000 mL | Freq: Once | INTRAVENOUS | Status: AC
  Administered 2024-01-17: 500 mL via INTRAVENOUS

## 2024-01-16 NOTE — ED Notes (Signed)
 Pt. CBG 304, RN made aware.

## 2024-01-16 NOTE — ED Triage Notes (Signed)
 BIBA from [facility] for UTI. Pt has generalized weakness, intermittent confusion. 88 hr 20 rr 33 etco2 146/70 bp 356 cbg

## 2024-01-16 NOTE — ED Provider Notes (Incomplete)
 " [place] EMERGENCY DEPARTMENT AT [facility] Provider Note   CSN: [id number] Arrival date & time: 01/16/24  2218     Patient presents with: Weakness   Jennifer Boyer is a 83 y.o. female.  {Add pertinent medical, surgical, social history, OB history to HPI:32947} HPI     Prior to Admission medications  Medication Sig Start Date End Date Taking? Authorizing Provider  acetaminophen  (TYLENOL ) 500 MG tablet Take 500-1,000 mg by mouth every 6 (six) hours as needed for moderate pain.    [provider]  ADMELOG SOLOSTAR 100 UNIT/ML KwikPen Inject into the skin. 09/14/23   [provider]  ascorbic acid  (VITAMIN C ) 500 MG tablet Take 1 tablet (500 mg total) by mouth daily. 10/24/23   Pokhrel, Laxman, MD  cholecalciferol  (VITAMIN D ) 1000 units tablet Take 1,000 Units by mouth once a week.     [provider]  donepezil  (ARICEPT ) 10 MG tablet Take 1 tablet (10 mg total) by mouth at bedtime. 03/15/23   Penumalli, Vikram R, MD  escitalopram  (LEXAPRO ) 10 MG tablet Take 10 mg by mouth daily. 09/26/21   [provider]  insulin  aspart (NOVOLOG ) 100 UNIT/ML FlexPen Before each meal 3 times a day, 140-199 - 4 units, 200-250 - 6 units, 251-299 - 8 units,  300-349 - 10 units,  350 or above 12 units. Insulin  PEN if approved, provide syringes and needles if needed. 08/05/21   Singh, Prashant K, MD  Insulin  Glargine (BASAGLAR  KWIKPEN) 100 UNIT/ML Inject 55 Units into the skin daily. Patient taking differently: Inject 35 Units into the skin daily. 08/05/21   Singh, Prashant K, MD  iron  polysaccharides (NIFEREX) 150 MG capsule Take 1 capsule (150 mg total) by mouth daily. 10/24/23   Pokhrel, Vernal, MD  JARDIANCE  10 MG TABS tablet Take 10 mg by mouth daily. 05/21/22   [provider]  levothyroxine  (SYNTHROID , LEVOTHROID) 75 MCG tablet Take 75 mcg by mouth daily before breakfast.    [provider]  lidocaine  (LIDODERM ) 5 % Place 1 patch onto the  skin daily. Remove & Discard patch within 12 hours or as directed by MDApply to back Apply to intact skin to cover the most painful area. Apply the prescribed number of patches (maximum of 3), for up to 12 hours within a 24 hour period. 10/24/23   Pokhrel, Laxman, MD  memantine  (NAMENDA ) 10 MG tablet Take 1 tablet (10 mg total) by mouth 2 (two) times daily. 03/15/23   Penumalli, Vikram R, MD  metFORMIN  (GLUCOPHAGE ) 1000 MG tablet Take 1,000 mg by mouth 2 (two) times daily with a meal.    [provider]  ondansetron  (ZOFRAN ) 4 MG tablet Take 1 tablet (4 mg total) by mouth every 6 (six) hours as needed for nausea. 10/24/23   Pokhrel, Laxman, MD  pantoprazole  (PROTONIX ) 40 MG tablet Take 40 mg by mouth daily.    [provider]  phenazopyridine  (PYRIDIUM ) 200 MG tablet Take 1 tablet (200 mg total) by mouth 3 (three) times daily. 10/16/23   Ula Prentice SAUNDERS, MD  polyethylene glycol (MIRALAX  / GLYCOLAX ) 17 g packet Take 17 g by mouth daily. 10/24/23   Pokhrel, Laxman, MD  rosuvastatin  (CRESTOR ) 20 MG tablet Take 20 mg by mouth daily.    [provider]    Allergies: Patient has no known allergies.    Review of Systems  Updated Vital Signs BP (!) 163/60 (BP Location: Right Arm)   Pulse 85   Temp 98.4 F (  36.9 C) (Oral)   Resp 18   SpO2 96%   Physical Exam  (all labs ordered are listed, but only abnormal results are displayed) Labs Reviewed  CULTURE, BLOOD (ROUTINE X 2)  CULTURE, BLOOD (ROUTINE X 2)  COMPREHENSIVE METABOLIC PANEL WITH GFR  CBC WITH DIFFERENTIAL/PLATELET  PROTIME-INR  BLOOD GAS, VENOUS  URINALYSIS, W/ REFLEX TO CULTURE (INFECTION SUSPECTED)  I-STAT CG4 LACTIC ACID, ED  CBG MONITORING, ED    EKG: None  Radiology: No results found.  {Document cardiac monitor, telemetry assessment procedure when appropriate:32947} Procedures   Medications Ordered in the ED - No data to display    {Click here for ABCD2, HEART and other calculators REFRESH  Note before signing:1}                              Medical Decision Making Amount and/or Complexity of Data Reviewed Labs: ordered. Radiology: ordered.   ***  {Document critical care time when appropriate  Document review of labs and clinical decision tools ie CHADS2VASC2, etc  Document your independent review of radiology images and any outside records  Document your discussion with family members, caretakers and with consultants  Document social determinants of health affecting pt's care  Document your decision making why or why not admission, treatments were needed:32947:::1}   Final diagnoses:  None    ED Discharge Orders     None        "

## 2024-01-17 ENCOUNTER — Encounter (HOSPITAL_COMMUNITY): Payer: Self-pay | Admitting: Internal Medicine

## 2024-01-17 ENCOUNTER — Emergency Department (HOSPITAL_COMMUNITY)

## 2024-01-17 DIAGNOSIS — M479 Spondylosis, unspecified: Secondary | ICD-10-CM | POA: Diagnosis present

## 2024-01-17 DIAGNOSIS — D638 Anemia in other chronic diseases classified elsewhere: Secondary | ICD-10-CM | POA: Diagnosis present

## 2024-01-17 DIAGNOSIS — Z79890 Hormone replacement therapy (postmenopausal): Secondary | ICD-10-CM | POA: Diagnosis not present

## 2024-01-17 DIAGNOSIS — M8448XA Pathological fracture, other site, initial encounter for fracture: Secondary | ICD-10-CM | POA: Diagnosis present

## 2024-01-17 DIAGNOSIS — Z7984 Long term (current) use of oral hypoglycemic drugs: Secondary | ICD-10-CM | POA: Diagnosis not present

## 2024-01-17 DIAGNOSIS — I159 Secondary hypertension, unspecified: Secondary | ICD-10-CM | POA: Diagnosis not present

## 2024-01-17 DIAGNOSIS — Z9049 Acquired absence of other specified parts of digestive tract: Secondary | ICD-10-CM | POA: Diagnosis not present

## 2024-01-17 DIAGNOSIS — G629 Polyneuropathy, unspecified: Secondary | ICD-10-CM | POA: Insufficient documentation

## 2024-01-17 DIAGNOSIS — K219 Gastro-esophageal reflux disease without esophagitis: Secondary | ICD-10-CM | POA: Diagnosis present

## 2024-01-17 DIAGNOSIS — D539 Nutritional anemia, unspecified: Secondary | ICD-10-CM | POA: Diagnosis not present

## 2024-01-17 DIAGNOSIS — E039 Hypothyroidism, unspecified: Secondary | ICD-10-CM | POA: Diagnosis not present

## 2024-01-17 DIAGNOSIS — Z66 Do not resuscitate: Secondary | ICD-10-CM | POA: Diagnosis present

## 2024-01-17 DIAGNOSIS — E063 Autoimmune thyroiditis: Secondary | ICD-10-CM | POA: Insufficient documentation

## 2024-01-17 DIAGNOSIS — R410 Disorientation, unspecified: Secondary | ICD-10-CM | POA: Diagnosis present

## 2024-01-17 DIAGNOSIS — I6389 Other cerebral infarction: Secondary | ICD-10-CM | POA: Diagnosis not present

## 2024-01-17 DIAGNOSIS — Z6837 Body mass index (BMI) 37.0-37.9, adult: Secondary | ICD-10-CM | POA: Diagnosis not present

## 2024-01-17 DIAGNOSIS — E1165 Type 2 diabetes mellitus with hyperglycemia: Secondary | ICD-10-CM | POA: Diagnosis present

## 2024-01-17 DIAGNOSIS — E66812 Obesity, class 2: Secondary | ICD-10-CM | POA: Diagnosis present

## 2024-01-17 DIAGNOSIS — R29701 NIHSS score 1: Secondary | ICD-10-CM | POA: Diagnosis not present

## 2024-01-17 DIAGNOSIS — G9589 Other specified diseases of spinal cord: Secondary | ICD-10-CM | POA: Diagnosis present

## 2024-01-17 DIAGNOSIS — Z79899 Other long term (current) drug therapy: Secondary | ICD-10-CM | POA: Diagnosis not present

## 2024-01-17 DIAGNOSIS — M797 Fibromyalgia: Secondary | ICD-10-CM | POA: Diagnosis present

## 2024-01-17 DIAGNOSIS — C801 Malignant (primary) neoplasm, unspecified: Secondary | ICD-10-CM | POA: Diagnosis not present

## 2024-01-17 DIAGNOSIS — C9030 Solitary plasmacytoma not having achieved remission: Secondary | ICD-10-CM | POA: Diagnosis present

## 2024-01-17 DIAGNOSIS — I951 Orthostatic hypotension: Secondary | ICD-10-CM | POA: Diagnosis present

## 2024-01-17 DIAGNOSIS — D6869 Other thrombophilia: Secondary | ICD-10-CM | POA: Diagnosis not present

## 2024-01-17 DIAGNOSIS — I10 Essential (primary) hypertension: Secondary | ICD-10-CM | POA: Diagnosis present

## 2024-01-17 DIAGNOSIS — E785 Hyperlipidemia, unspecified: Secondary | ICD-10-CM | POA: Diagnosis present

## 2024-01-17 DIAGNOSIS — N390 Urinary tract infection, site not specified: Secondary | ICD-10-CM | POA: Diagnosis present

## 2024-01-17 DIAGNOSIS — G9341 Metabolic encephalopathy: Secondary | ICD-10-CM | POA: Diagnosis present

## 2024-01-17 DIAGNOSIS — Z9013 Acquired absence of bilateral breasts and nipples: Secondary | ICD-10-CM | POA: Diagnosis not present

## 2024-01-17 DIAGNOSIS — Z794 Long term (current) use of insulin: Secondary | ICD-10-CM | POA: Diagnosis not present

## 2024-01-17 DIAGNOSIS — E860 Dehydration: Secondary | ICD-10-CM | POA: Diagnosis present

## 2024-01-17 DIAGNOSIS — R4189 Other symptoms and signs involving cognitive functions and awareness: Secondary | ICD-10-CM | POA: Diagnosis present

## 2024-01-17 DIAGNOSIS — F0390 Unspecified dementia without behavioral disturbance: Secondary | ICD-10-CM | POA: Diagnosis present

## 2024-01-17 DIAGNOSIS — E1169 Type 2 diabetes mellitus with other specified complication: Secondary | ICD-10-CM | POA: Insufficient documentation

## 2024-01-17 DIAGNOSIS — Z515 Encounter for palliative care: Secondary | ICD-10-CM | POA: Diagnosis not present

## 2024-01-17 DIAGNOSIS — I6340 Cerebral infarction due to embolism of unspecified cerebral artery: Secondary | ICD-10-CM | POA: Diagnosis not present

## 2024-01-17 LAB — BLOOD GAS, VENOUS
Acid-Base Excess: 2.7 mmol/L — ABNORMAL HIGH (ref 0.0–2.0)
Bicarbonate: 27.9 mmol/L (ref 20.0–28.0)
O2 Saturation: 37.6 %
Patient temperature: 36.1
pCO2, Ven: 42 mmHg — ABNORMAL LOW (ref 44–60)
pH, Ven: 7.42 (ref 7.25–7.43)
pO2, Ven: <31 mmHg — CL (ref 32–45)

## 2024-01-17 LAB — COMPREHENSIVE METABOLIC PANEL WITH GFR
ALT: 11 U/L (ref 0–44)
AST: 23 U/L (ref 15–41)
Albumin: 3.9 g/dL (ref 3.5–5.0)
Alkaline Phosphatase: 68 U/L (ref 38–126)
Anion gap: 11 (ref 5–15)
BUN: 20 mg/dL (ref 8–23)
CO2: 24 mmol/L (ref 22–32)
Calcium: 10 mg/dL (ref 8.9–10.3)
Chloride: 100 mmol/L (ref 98–111)
Creatinine, Ser: 1.03 mg/dL — ABNORMAL HIGH (ref 0.44–1.00)
GFR, Estimated: 54 mL/min — ABNORMAL LOW
Glucose, Bld: 301 mg/dL — ABNORMAL HIGH (ref 70–99)
Potassium: 4.4 mmol/L (ref 3.5–5.1)
Sodium: 135 mmol/L (ref 135–145)
Total Bilirubin: 0.6 mg/dL (ref 0.0–1.2)
Total Protein: 8.3 g/dL — ABNORMAL HIGH (ref 6.5–8.1)

## 2024-01-17 LAB — CBC WITH DIFFERENTIAL/PLATELET
Abs Immature Granulocytes: 0.04 K/uL (ref 0.00–0.07)
Basophils Absolute: 0 K/uL (ref 0.0–0.1)
Basophils Relative: 0 %
Eosinophils Absolute: 0 K/uL (ref 0.0–0.5)
Eosinophils Relative: 0 %
HCT: 33.1 % — ABNORMAL LOW (ref 36.0–46.0)
Hemoglobin: 10.5 g/dL — ABNORMAL LOW (ref 12.0–15.0)
Immature Granulocytes: 1 %
Lymphocytes Relative: 29 %
Lymphs Abs: 2.4 K/uL (ref 0.7–4.0)
MCH: 32.5 pg (ref 26.0–34.0)
MCHC: 31.7 g/dL (ref 30.0–36.0)
MCV: 102.5 fL — ABNORMAL HIGH (ref 80.0–100.0)
Monocytes Absolute: 0.4 K/uL (ref 0.1–1.0)
Monocytes Relative: 5 %
Neutro Abs: 5.3 K/uL (ref 1.7–7.7)
Neutrophils Relative %: 65 %
Platelets: 213 K/uL (ref 150–400)
RBC: 3.23 MIL/uL — ABNORMAL LOW (ref 3.87–5.11)
RDW: 15.2 % (ref 11.5–15.5)
WBC: 8.2 K/uL (ref 4.0–10.5)
nRBC: 0 % (ref 0.0–0.2)

## 2024-01-17 LAB — URINALYSIS, W/ REFLEX TO CULTURE (INFECTION SUSPECTED)
Bilirubin Urine: NEGATIVE
Glucose, UA: >=500 mg/dL — AB
Hgb urine dipstick: NEGATIVE
Ketones, ur: NEGATIVE mg/dL
Nitrite: NEGATIVE
Protein, ur: NEGATIVE mg/dL
Specific Gravity, Urine: 1.023 (ref 1.005–1.030)
pH: 6 (ref 5.0–8.0)

## 2024-01-17 LAB — I-STAT CG4 LACTIC ACID, ED
Lactic Acid, Venous: 1.6 mmol/L (ref 0.5–1.9)
Lactic Acid, Venous: 0.9 mmol/L (ref 0.5–1.9)

## 2024-01-17 LAB — PROTIME-INR
INR: 1 (ref 0.8–1.2)
Prothrombin Time: 13.3 s (ref 11.4–15.2)

## 2024-01-17 LAB — GLUCOSE, CAPILLARY
Glucose-Capillary: 140 mg/dL — ABNORMAL HIGH (ref 70–99)
Glucose-Capillary: 242 mg/dL — ABNORMAL HIGH (ref 70–99)

## 2024-01-17 MED ORDER — INSULIN ASPART 100 UNIT/ML IJ SOLN
0.0000 [IU] | Freq: Three times a day (TID) | INTRAMUSCULAR | Status: DC
  Administered 2024-01-18: 2 [IU] via SUBCUTANEOUS
  Administered 2024-01-18 – 2024-01-19 (×3): 3 [IU] via SUBCUTANEOUS
  Administered 2024-01-19: 5 [IU] via SUBCUTANEOUS
  Filled 2024-01-17: qty 5
  Filled 2024-01-17 (×2): qty 3
  Filled 2024-01-17: qty 2
  Filled 2024-01-17: qty 3

## 2024-01-17 MED ORDER — SODIUM CHLORIDE 0.45 % IV BOLUS
1000.0000 mL | Freq: Once | INTRAVENOUS | Status: AC
  Administered 2024-01-17: 1000 mL via INTRAVENOUS

## 2024-01-17 MED ORDER — PANTOPRAZOLE SODIUM 40 MG PO TBEC
40.0000 mg | DELAYED_RELEASE_TABLET | Freq: Every day | ORAL | Status: DC
  Administered 2024-01-17 – 2024-01-21 (×4): 40 mg via ORAL
  Filled 2024-01-17 (×5): qty 1

## 2024-01-17 MED ORDER — INSULIN ASPART 100 UNIT/ML IJ SOLN
0.0000 [IU] | Freq: Every day | INTRAMUSCULAR | Status: DC
  Administered 2024-01-17 – 2024-01-20 (×4): 2 [IU] via SUBCUTANEOUS
  Filled 2024-01-17 (×4): qty 2

## 2024-01-17 MED ORDER — ONDANSETRON HCL 4 MG/2ML IJ SOLN: 4.0000 mg | Freq: Four times a day (QID) | INTRAMUSCULAR | Status: DC | PRN

## 2024-01-17 MED ORDER — DONEPEZIL HCL 10 MG PO TABS
10.0000 mg | ORAL_TABLET | Freq: Every day | ORAL | Status: DC
  Administered 2024-01-17 – 2024-01-20 (×4): 10 mg via ORAL
  Filled 2024-01-17 (×4): qty 1

## 2024-01-17 MED ORDER — SODIUM CHLORIDE 0.9 % IV SOLN
2.0000 g | INTRAVENOUS | Status: DC
  Administered 2024-01-18 – 2024-01-20 (×3): 2 g via INTRAVENOUS
  Filled 2024-01-17 (×3): qty 20

## 2024-01-17 MED ORDER — ONDANSETRON HCL 4 MG PO TABS: 4.0000 mg | ORAL_TABLET | Freq: Four times a day (QID) | ORAL | Status: DC | PRN

## 2024-01-17 MED ORDER — LIDOCAINE 5 % EX PTCH
1.0000 | MEDICATED_PATCH | CUTANEOUS | Status: DC
  Administered 2024-01-17 – 2024-01-20 (×4): 1 via TRANSDERMAL
  Filled 2024-01-17 (×4): qty 1

## 2024-01-17 MED ORDER — IOHEXOL 300 MG/ML  SOLN
100.0000 mL | Freq: Once | INTRAMUSCULAR | Status: AC | PRN
  Administered 2024-01-17: 100 mL via INTRAVENOUS

## 2024-01-17 MED ORDER — SODIUM CHLORIDE 0.45 % IV SOLN: INTRAVENOUS | Status: DC

## 2024-01-17 MED ORDER — LEVOTHYROXINE SODIUM 75 MCG PO TABS
75.0000 ug | ORAL_TABLET | Freq: Every day | ORAL | Status: DC
  Administered 2024-01-18 – 2024-01-21 (×4): 75 ug via ORAL
  Filled 2024-01-17 (×4): qty 1

## 2024-01-17 MED ORDER — FLUCONAZOLE 150 MG PO TABS
150.0000 mg | ORAL_TABLET | Freq: Once | ORAL | Status: AC
  Administered 2024-01-17: 150 mg via ORAL
  Filled 2024-01-17: qty 1

## 2024-01-17 MED ORDER — SODIUM CHLORIDE 0.9 % IV SOLN
1.0000 g | Freq: Once | INTRAVENOUS | Status: AC
  Administered 2024-01-17: 1 g via INTRAVENOUS
  Filled 2024-01-17: qty 10

## 2024-01-17 MED ORDER — ACETAMINOPHEN 650 MG RE SUPP: 650.0000 mg | Freq: Four times a day (QID) | RECTAL | Status: DC | PRN

## 2024-01-17 MED ORDER — ENOXAPARIN SODIUM 40 MG/0.4ML IJ SOSY
40.0000 mg | PREFILLED_SYRINGE | INTRAMUSCULAR | Status: DC
  Administered 2024-01-17 – 2024-01-20 (×4): 40 mg via SUBCUTANEOUS
  Filled 2024-01-17 (×4): qty 0.4

## 2024-01-17 MED ORDER — ACETAMINOPHEN 325 MG PO TABS
650.0000 mg | ORAL_TABLET | Freq: Four times a day (QID) | ORAL | Status: DC | PRN
  Administered 2024-01-17 – 2024-01-19 (×5): 650 mg via ORAL
  Filled 2024-01-17 (×5): qty 2

## 2024-01-17 MED ORDER — LORAZEPAM 0.5 MG PO TABS: 0.5000 mg | ORAL_TABLET | Freq: Once | ORAL | Status: DC | PRN

## 2024-01-17 MED ORDER — ACETAMINOPHEN 500 MG PO TABS
1000.0000 mg | ORAL_TABLET | Freq: Once | ORAL | Status: AC
  Administered 2024-01-17: 1000 mg via ORAL
  Filled 2024-01-17: qty 2

## 2024-01-17 NOTE — H&P (Signed)
 " History and Physical    Patient: Jennifer Boyer FMW:[id number] DOB: Apr 05, 1940 DOA: 01/16/2024 DOS: the patient was seen and examined on 01/17/2024 PCP: Dayna Motto, DO  Patient coming from: ALF/ILF  Chief Complaint:  Chief Complaint  Patient presents with   Weakness   HPI: Jennifer Boyer is a 83 y.o. female with medical history significant of macrocytic anemia, osteoarthritis, dementia, type 2 diabetes, history of DKA type II, difficult intubation, fibromyalgia, GERD, hiatal hernia, migraine headaches, hypertension, hyperlipidemia, history of autoimmune thyroiditis, hypothyroidism, class II obesity, history of intractable lower back pain, history of herniated nucleus pulposus along spine, history intractable back pain who was sent to the emergency department via EMS from [facility] due to generalized weakness and intermittent confusion.  She knows she is in the hospital due to an UTI and is now oriented to time/day.  She denied fever, chills, rhinorrhea, sore throat, wheezing or hemoptysis.  No chest pain, palpitations, diaphoresis, PND, orthopnea or pitting edema of the lower extremities.  No abdominal pain, nausea, emesis, diarrhea, constipation, melena or hematochezia.  No flank pain, dysuria, frequency or hematuria.  No polyuria, polydipsia, polyphagia or blurred vision.   Lab work: Urinalysis was hazy with greater than 500 glucose, trace leukocyte esterase, many bacteria, 0-5 WBC and presence of budding yeast.  CBC showed a white count of 8.2, hemoglobin 10.5 g/dL with an MCV of 897.4 fL and platelets 213.  Normal PT and INR.  Normal lactic acid x 2.  Venous blood gas showed a pCO2 of 42 and pO2 less than 31 mmHg, bicarbonate 27.9 and acid base excess 2.7 mmol/L.  CMP showed total protein 8.3 g/dL, glucose 698 and creatinine 1.03 mg/dL.  BUN, electrolytes and the rest of the LFTs were normal.  Imaging: CT head without contrast with no acute intracranial normality.  CT  abdomen/pelvis with contrast with no acute findings within the abdomen or pelvis.  There was bladder wall thickening, possibly due to incomplete distention or cystitis.  Progressive, destructive mass involving the L1 vertebral with mild pathologic fracture, now measuring 3.3 x 3.2 cm, concerning for underlying malignancy and favoring osseous metastasis.  Primary site of the disease is unknown.  ED course: Initial vital signs were temperature 98.4 F, pulse 85, respiration 18, BP 163/60 mmHg and O2 sat 96% on room air.  The patient received acetaminophen  1000 mg p.o. x 1 dose, ceftriaxone  1 g IVPB, fluconazole  150 mg p.o. x 1 dose and LR 500 mL bolus x 1 dose.   Review of Systems: As mentioned in the history of present illness. All other systems reviewed and are negative. Past Medical History:  Diagnosis Date   Arthritis    OA   Complication of anesthesia    pt has been told she is hard to intubate.   Dementia (HCC)    Diabetes mellitus without complication (HCC)    Difficult intubation    Fibromyalgia    GERD (gastroesophageal reflux disease)    Headache    HX MIGRAINES  NONE IN LONG TIME   History of hiatal hernia    Hypertension    Hypothyroidism    Past Surgical History:  Procedure Laterality Date   BREAST SURGERY     bilateral mastectomy   CHOLECYSTECTOMY     DILATATION & CURETTAGE/HYSTEROSCOPY WITH TRUECLEAR N/A 09/30/2012   Procedure: DILATATION & CURETTAGE/HYSTEROSCOPY WITH TRUECLEAR;  Surgeon: Lynwood FORBES Clubs II, MD;  Location: [facility];  Service: Gynecology;  Laterality: N/A;   DILATION AND CURETTAGE  OF UTERUS     uterine polyp   KNEE SURGERY     LUMBAR LAMINECTOMY/DECOMPRESSION MICRODISCECTOMY Left 03/24/2017   Procedure: LEFT LUMBAR FIVE-SACRAL ONE MICRODISCECTOMY, LEFT LUMBAR FOUR-FIVE DECOMPRESSIVE LAMINECTOMY;  Surgeon: Onetha Kuba, MD;  Location: [facility] OR;  Service: Neurosurgery;  Laterality: Left;  Left L4-5 L5-S1 Laminectomy/Foraminotomy   LUMBAR LAMINECTOMY/DECOMPRESSION  MICRODISCECTOMY Left 09/01/2017   Procedure: Microdiscectomy - Lumbar Five-Sacral One - left redo;  Surgeon: Onetha Kuba, MD;  Location: [facility] OR;  Service: Neurosurgery;  Laterality: Left;  left   TONSILLECTOMY     Social History:  reports that she has never smoked. She has never used smokeless tobacco. She reports that she does not drink alcohol and does not use drugs.  Allergies[1]  No family history on file.  Prior to Admission medications  Medication Sig Start Date End Date Taking? Authorizing Provider  acetaminophen  (TYLENOL ) 500 MG tablet Take 500-1,000 mg by mouth every 6 (six) hours as needed for moderate pain.    [provider]  ADMELOG SOLOSTAR 100 UNIT/ML KwikPen Inject into the skin. 09/14/23   [provider]  ascorbic acid  (VITAMIN C ) 500 MG tablet Take 1 tablet (500 mg total) by mouth daily. 10/24/23   Pokhrel, Laxman, MD  cholecalciferol  (VITAMIN D ) 1000 units tablet Take 1,000 Units by mouth once a week.     [provider]  donepezil  (ARICEPT ) 10 MG tablet Take 1 tablet (10 mg total) by mouth at bedtime. 03/15/23   Penumalli, Eduard SAUNDERS, MD  escitalopram  (LEXAPRO ) 10 MG tablet Take 10 mg by mouth daily. 09/26/21   [provider]  insulin  aspart (NOVOLOG ) 100 UNIT/ML FlexPen Before each meal 3 times a day, 140-199 - 4 units, 200-250 - 6 units, 251-299 - 8 units,  300-349 - 10 units,  350 or above 12 units. Insulin  PEN if approved, provide syringes and needles if needed. 08/05/21   Singh, Prashant K, MD  Insulin  Glargine (BASAGLAR  KWIKPEN) 100 UNIT/ML Inject 55 Units into the skin daily. Patient taking differently: Inject 35 Units into the skin daily. 08/05/21   Singh, Prashant K, MD  iron  polysaccharides (NIFEREX) 150 MG capsule Take 1 capsule (150 mg total) by mouth daily. 10/24/23   Pokhrel, Laxman, MD  JARDIANCE  10 MG TABS tablet Take 10 mg by mouth daily. 05/21/22   [provider]  levothyroxine  (SYNTHROID , LEVOTHROID) 75 MCG tablet Take 75  mcg by mouth daily before breakfast.    [provider]  lidocaine  (LIDODERM ) 5 % Place 1 patch onto the skin daily. Remove & Discard patch within 12 hours or as directed by MDApply to back Apply to intact skin to cover the most painful area. Apply the prescribed number of patches (maximum of 3), for up to 12 hours within a 24 hour period. 10/24/23   Pokhrel, Laxman, MD  memantine  (NAMENDA ) 10 MG tablet Take 1 tablet (10 mg total) by mouth 2 (two) times daily. 03/15/23   Penumalli, Vikram R, MD  metFORMIN  (GLUCOPHAGE ) 1000 MG tablet Take 1,000 mg by mouth 2 (two) times daily with a meal.    [provider]  ondansetron  (ZOFRAN ) 4 MG tablet Take 1 tablet (4 mg total) by mouth every 6 (six) hours as needed for nausea. 10/24/23   Pokhrel, Laxman, MD  pantoprazole  (PROTONIX ) 40 MG tablet Take 40 mg by mouth daily.    [provider]  phenazopyridine  (PYRIDIUM ) 200 MG tablet Take 1 tablet (200 mg total) by mouth 3 (three) times daily. 10/16/23  Ula Prentice SAUNDERS, MD  polyethylene glycol (MIRALAX  / GLYCOLAX ) 17 g packet Take 17 g by mouth daily. 10/24/23   Pokhrel, Laxman, MD  rosuvastatin  (CRESTOR ) 20 MG tablet Take 20 mg by mouth daily.    [provider]    Physical Exam: Vitals:   01/17/24 1100 01/17/24 1115 01/17/24 1130 01/17/24 1145  BP: (!) 165/82     Pulse:      Resp: (!) 25 (!) 24 (!) 24 15  Temp:      TempSrc:      SpO2:      Weight:      Height:       Physical Exam Vitals and nursing note reviewed.  Constitutional:      General: She is awake. She is not in acute distress.    Appearance: She is ill-appearing.  HENT:     Head: Normocephalic.     Nose: No rhinorrhea.     Mouth/Throat:     Mouth: Mucous membranes are moist.  Eyes:     General: No scleral icterus.    Pupils: Pupils are equal, round, and reactive to light.  Neck:     Vascular: No JVD.  Cardiovascular:     Rate and Rhythm: Normal rate and regular rhythm.     Heart sounds: S1 normal  and S2 normal.  Pulmonary:     Effort: Pulmonary effort is normal.     Breath sounds: Normal breath sounds. No wheezing, rhonchi or rales.  Abdominal:     General: Bowel sounds are normal. There is no distension.     Palpations: Abdomen is soft.     Tenderness: There is no abdominal tenderness. There is no right CVA tenderness or left CVA tenderness.  Musculoskeletal:     Cervical back: Neck supple.     Right lower leg: No edema.     Left lower leg: No edema.  Skin:    General: Skin is warm and dry.  Neurological:     General: No focal deficit present.     Mental Status: She is alert and oriented to person, place, and time.  Psychiatric:        Mood and Affect: Mood normal.        Behavior: Behavior normal. Behavior is cooperative.     Data Reviewed:  Results are pending, will review when available.  EKG: Vent. rate 82 BPM  PR interval 181 ms  QRS duration 92 ms  QT/QTcB 376/440 ms  P-R-T axes 0 -12 59  Sinus rhythm  RSR' in V1 or V2, probably normal variant  Borderline T abnormalities, anterior lead  Assessment and Plan: Principal Problem:   Acute metabolic encephalopathy In the setting of:   Acute UTI (urinary tract infection) Admit to MedSurg/inpatient. Continue ceftriaxone  2 g IVPB daily. Follow urine culture and sensitivity. Follow-up blood culture and sensitivity. Follow-up CBC and chemistry in the morning.  Active Problems:    Back pain In the setting of:    Lumbar mass (HCC) With associated:   Pathological fracture of lumbar vertebra Have been unable to obtain MRI. -Family unable to find remote control for bladder stimulator. - Will obtain CT of the chest to look for primary. - Will consult IR for biopsy in the morning.    Hypothyroidism Continue levothyroxine  75 mcg p.o. daily.    HTN (hypertension) Monitor blood pressure. Awaiting med rec report. As needed oral or IV antihypertensives.    Macrocytic anemia No deficiency of B12/folate in  October/2025. Monitor  hemoglobin/hematocrit.    HLD (hyperlipidemia) Continue simvastatin  pending med rec.    Hyperglycemia due to type 2 diabetes mellitus (HCC) Carbohydrate modified diet. CBG monitoring with RI SS. Check hemoglobin A1c.    Class 2 obesity Current BMI 37.11 kg/m. Would benefit from lifestyle modifications. Follow-up closely with PCP.    Advance Care Planning:   Code Status: Full Code   Consults:   Family Communication: Left a voicemail message for her daughter.   Severity of Illness: The appropriate patient status for this patient is INPATIENT. Inpatient status is judged to be reasonable and necessary in order to provide the required intensity of service to ensure the patient's safety. The patient's presenting symptoms, physical exam findings, and initial radiographic and laboratory data in the context of their chronic comorbidities is felt to place them at high risk for further clinical deterioration. Furthermore, it is not anticipated that the patient will be medically stable for discharge from the hospital within 2 midnights of admission.   * I certify that at the point of admission it is my clinical judgment that the patient will require inpatient hospital care spanning beyond 2 midnights from the point of admission due to high intensity of service, high risk for further deterioration and high frequency of surveillance required.*  Author: Alm Dorn Castor, MD 01/17/2024 12:45 PM  For on call review [email or web address].   This document was prepared using Dragon voice recognition software and may contain some unintended transcription errors.     [1] No Known Allergies  "

## 2024-01-17 NOTE — ED Provider Notes (Signed)
" °  Physical Exam  BP (!) 158/106 (BP Location: Right Arm)   Pulse 84   Temp (!) 97.4 F (36.3 C) (Oral)   Resp 16   Ht 5' 6 (1.676 m)   Wt 104.3 kg   SpO2 98%   BMI 37.11 kg/m   Physical Exam  Procedures  Procedures  ED Course / MDM   Clinical Course as of 01/17/24 1023  Mon Jan 17, 2024  0522 Collateral history from the patient's daughter who states that the patient has been struggling for a few days now with confusion, urinary incontinence, and episode of diarrhea in the bed which is very atypical for her.  Today was having difficulty walking due to generalized weakness and had a fall this evening with the patient's son prompting ED visit. [RS]  [id number] Consulted Dr. Shona, hospitalist, who is requesting MR prior to admission, incase neurosurgery consult is necessary. Repage hospital medicine after MR. I appreciate her collaboration in the care of this patient.   [RS]    Clinical Course User Index [RS] Sponseller, Pleasant SAUNDERS, PA-C   Medical Decision Making Amount and/or Complexity of Data Reviewed Labs: ordered. Radiology: ordered.  Risk OTC drugs. Prescription drug management. Decision regarding hospitalization.   Patient to ED from [facility] assisted living with increased confusion over baseline, generalized weakness, fall. H/O dementia. Determined to have a UTI, felt to require admission. She had a fall and imaging of the abdomen (CT) showed:  IMPRESSION: 1. No acute findings within the abdomen and pelvis. 2. Bladder wall thickening, possibly due to incomplete distention or cystitis. 3. Progressive, destructive mass involving the L1 vertebra with mild pathologic fracture, now measuring 3.3 x 3.2 cm, concerning for underlying malignancy and favoring osseous metastasis. Primary the site of disease is unknown.  Previous treatment team discussed admission with hospitalist who declined admission until MRI of her spine is completed to determine if neurosurgery needed to be  directly involved. Feel she will require admission regardless of the MRI. The patient has a bladder stimulator.   10:15 - Delay in obtaining MRI due to getting the remote for the stimulator to the ED and placed in MRI mode.    11:55 - family unable to locate the remote for the bladder stimulator making MRI unavailable. Will attempt to admit to continue evaluation in hospital.   12:40 - discussed with Dr. Celinda, TRH, who accepts for admission and further work up.       Odell Balls, PA-C 01/17/24 1245  "

## 2024-01-17 NOTE — ED Notes (Signed)
 Pt noted to have scooted to the end of the bed. Myself an another staff member went to assist pt back in the bed. She stated she wanted to get up to use the restroom.  She was noted to have wet her bed and she had a full brief as well (urine). Pt was assisted to a sitting position and then to a standing position.  She was noted to be unsteady on her feet so a BSC was suggested. She refused at first but went along with it eventually. She was assisted from the chair to the [facility] w/o incident. She stated she wanted to sit for a while as she was tired of lying down for so long. She stated she was not done on the commode yet, so this medic gave her some privacy. The blinds were cracked for safety and visualization. The assigned tech was in the room the entire time.  Pts bed linens replaced and bed was cleaned. VS updated.  No other complaints noted or verbalized.  Will continue to monitor.

## 2024-01-17 NOTE — Inpatient Diabetes Management (Signed)
 Inpatient Diabetes Program Recommendations  AACE/ADA: New Consensus Statement on Inpatient Glycemic Control   Target Ranges:  Prepandial:   less than 140 mg/dL      Peak postprandial:   less than 180 mg/dL (1-2 hours)      Critically ill patients:  140 - 180 mg/dL    Latest Reference Range & Units 01/17/24 00:47  Glucose 70 - 99 mg/dL 698 (H)    Latest Reference Range & Units 01/16/24 23:15  Glucose-Capillary 70 - 99 mg/dL 695 (H)   Review of Glycemic Control  Diabetes history: DM2 Outpatient Diabetes medications: Basaglar  35 units daily, Novolog  0-12 units TID with meals, Jardiance  10 mg daily, Metformin  1000 mg BID Current orders for Inpatient glycemic control: None; in ED  Inpatient Diabetes Program Recommendations:    Insulin : Lab glucose 301 mg/dl at 99:52 today. No insulin  given since arrival to hospital.  Please consider ordering CBGs Q4H, Novolog  0-15 units Q4H, and Lantus  15 units Q24H.  Thanks, Earnie Gainer, RN, MSN, CDCES Diabetes Coordinator Inpatient Diabetes Program [phone] (Team Pager from 8am to 5pm)

## 2024-01-17 NOTE — ED Notes (Signed)
 Full linen and gown change. Inc care.

## 2024-01-17 NOTE — ED Notes (Signed)
 Patient was placed on a bed alarm.

## 2024-01-18 ENCOUNTER — Inpatient Hospital Stay (HOSPITAL_COMMUNITY)

## 2024-01-18 DIAGNOSIS — E1165 Type 2 diabetes mellitus with hyperglycemia: Secondary | ICD-10-CM

## 2024-01-18 DIAGNOSIS — D539 Nutritional anemia, unspecified: Secondary | ICD-10-CM

## 2024-01-18 DIAGNOSIS — I159 Secondary hypertension, unspecified: Secondary | ICD-10-CM

## 2024-01-18 DIAGNOSIS — E785 Hyperlipidemia, unspecified: Secondary | ICD-10-CM

## 2024-01-18 DIAGNOSIS — Z794 Long term (current) use of insulin: Secondary | ICD-10-CM

## 2024-01-18 DIAGNOSIS — E039 Hypothyroidism, unspecified: Secondary | ICD-10-CM

## 2024-01-18 DIAGNOSIS — E66812 Obesity, class 2: Secondary | ICD-10-CM

## 2024-01-18 HISTORY — PX: IR IVC FILTER RETRIEVAL / S&I /IMG GUID/MOD SED: IMG5308

## 2024-01-18 LAB — CBC
HCT: 34.8 % — ABNORMAL LOW (ref 36.0–46.0)
Hemoglobin: 11.1 g/dL — ABNORMAL LOW (ref 12.0–15.0)
MCH: 32.2 pg (ref 26.0–34.0)
MCHC: 31.9 g/dL (ref 30.0–36.0)
MCV: 100.9 fL — ABNORMAL HIGH (ref 80.0–100.0)
Platelets: 223 K/uL (ref 150–400)
RBC: 3.45 MIL/uL — ABNORMAL LOW (ref 3.87–5.11)
RDW: 15.3 % (ref 11.5–15.5)
WBC: 6 K/uL (ref 4.0–10.5)
nRBC: 0 % (ref 0.0–0.2)

## 2024-01-18 LAB — COMPREHENSIVE METABOLIC PANEL WITH GFR
ALT: 10 U/L (ref 0–44)
AST: 24 U/L (ref 15–41)
Albumin: 3.8 g/dL (ref 3.5–5.0)
Alkaline Phosphatase: 64 U/L (ref 38–126)
Anion gap: 13 (ref 5–15)
BUN: 15 mg/dL (ref 8–23)
CO2: 22 mmol/L (ref 22–32)
Calcium: 10.3 mg/dL (ref 8.9–10.3)
Chloride: 100 mmol/L (ref 98–111)
Creatinine, Ser: 0.81 mg/dL (ref 0.44–1.00)
GFR, Estimated: >60 mL/min
Glucose, Bld: 180 mg/dL — ABNORMAL HIGH (ref 70–99)
Potassium: 4.3 mmol/L (ref 3.5–5.1)
Sodium: 135 mmol/L (ref 135–145)
Total Bilirubin: 0.6 mg/dL (ref 0.0–1.2)
Total Protein: 8.4 g/dL — ABNORMAL HIGH (ref 6.5–8.1)

## 2024-01-18 LAB — URINE CULTURE

## 2024-01-18 LAB — GLUCOSE, CAPILLARY
Glucose-Capillary: 184 mg/dL — ABNORMAL HIGH (ref 70–99)
Glucose-Capillary: 179 mg/dL — ABNORMAL HIGH (ref 70–99)
Glucose-Capillary: 126 mg/dL — ABNORMAL HIGH (ref 70–99)
Glucose-Capillary: 239 mg/dL — ABNORMAL HIGH (ref 70–99)

## 2024-01-18 MED ORDER — IOHEXOL 300 MG/ML  SOLN
75.0000 mL | Freq: Once | INTRAMUSCULAR | Status: AC | PRN
  Administered 2024-01-18: 75 mL via INTRAVENOUS

## 2024-01-18 MED ORDER — MIDAZOLAM HCL (PF) 2 MG/2ML IJ SOLN
INTRAMUSCULAR | Status: AC | PRN
  Administered 2024-01-18: 1 mg via INTRAVENOUS

## 2024-01-18 MED ORDER — INSULIN GLARGINE 100 UNIT/ML ~~LOC~~ SOLN
20.0000 [IU] | SUBCUTANEOUS | Status: DC
  Administered 2024-01-18 – 2024-01-20 (×3): 20 [IU] via SUBCUTANEOUS
  Filled 2024-01-18 (×5): qty 0.2

## 2024-01-18 MED ORDER — FENTANYL CITRATE (PF) 100 MCG/2ML IJ SOLN
INTRAMUSCULAR | Status: AC
  Filled 2024-01-18: qty 2

## 2024-01-18 MED ORDER — MIDAZOLAM HCL 2 MG/2ML IJ SOLN
INTRAMUSCULAR | Status: AC
  Filled 2024-01-18: qty 2

## 2024-01-18 MED ORDER — LIDOCAINE HCL (PF) 1 % IJ SOLN
INTRAMUSCULAR | Status: AC
  Filled 2024-01-18: qty 30

## 2024-01-18 MED ORDER — POLYSACCHARIDE IRON COMPLEX 150 MG PO CAPS
150.0000 mg | ORAL_CAPSULE | Freq: Every day | ORAL | Status: DC
  Administered 2024-01-18 – 2024-01-20 (×3): 150 mg via ORAL
  Filled 2024-01-18 (×5): qty 1

## 2024-01-18 MED ORDER — GADOBUTROL 1 MMOL/ML IV SOLN
11.0000 mL | Freq: Once | INTRAVENOUS | Status: AC | PRN
  Administered 2024-01-18: 10 mL via INTRAVENOUS

## 2024-01-18 MED ORDER — LIDOCAINE HCL (PF) 1 % IJ SOLN
10.0000 mL | Freq: Once | INTRAMUSCULAR | Status: AC
  Administered 2024-01-18: 10 mL via INTRADERMAL

## 2024-01-18 MED ORDER — INSULIN GLARGINE 100 UNIT/ML ~~LOC~~ SOLN
20.0000 [IU] | Freq: Every day | SUBCUTANEOUS | Status: DC
  Filled 2024-01-18: qty 0.2

## 2024-01-18 MED ORDER — ROSUVASTATIN CALCIUM 10 MG PO TABS
20.0000 mg | ORAL_TABLET | Freq: Every evening | ORAL | Status: DC
  Administered 2024-01-18 – 2024-01-20 (×3): 20 mg via ORAL
  Filled 2024-01-18 (×4): qty 2

## 2024-01-18 MED ORDER — BASAGLAR KWIKPEN 100 UNIT/ML ~~LOC~~ SOPN: 20.0000 [IU] | PEN_INJECTOR | SUBCUTANEOUS | Status: DC

## 2024-01-18 MED ORDER — MELATONIN 5 MG PO TABS
5.0000 mg | ORAL_TABLET | Freq: Once | ORAL | Status: AC
  Administered 2024-01-18: 5 mg via ORAL
  Filled 2024-01-18: qty 1

## 2024-01-18 MED ORDER — FENTANYL CITRATE (PF) 100 MCG/2ML IJ SOLN
INTRAMUSCULAR | Status: AC | PRN
  Administered 2024-01-18 (×2): 25 ug via INTRAVENOUS

## 2024-01-18 MED ORDER — SODIUM CHLORIDE 0.45 % IV SOLN: INTRAVENOUS | Status: DC

## 2024-01-18 NOTE — Progress Notes (Signed)
 " PROGRESS NOTE    Jennifer Boyer  FMW:[id number] DOB: 01-08-1941 DOA: 01/16/2024 PCP: Dayna Motto, DO   Brief Narrative:  Jennifer Boyer is a 83 y.o. female with medical history significant of macrocytic anemia, osteoarthritis, dementia, type 2 diabetes, history of DKA type II, difficult intubation, fibromyalgia, GERD, hiatal hernia, migraine headaches, hypertension, hyperlipidemia, history of autoimmune thyroiditis, hypothyroidism, class II obesity, history of intractable lower back pain, history of herniated nucleus pulposus along spine, history intractable back pain who was sent to the emergency department via EMS from [facility] due to generalized weakness and intermittent confusion.  Being admitted for acute metabolic encephalopathy in setting of suspected UTI and further workup revealed that she has a lumbar mass with associated pathological fracture which will need to workup given that is concerning for underlying malignancy.  Undergoing MRIs later now that we are obtaining a universal remote for her bladder stimulator to be put into MRI mode as she is also getting a lumbar spine biopsy of the bone mass.    Assessment and Plan:  Acute Metabolic Encephalopathy In the setting of suspected acute UTI (urinary tract infection) superimposed on Chronic Dementia: Admitted to MedSurg/inpatient. CT Head done and showed no acute intracranial abnormality. MRI Brain w/wo Contrast pending. UA showed hazy appearance with greater than 500 glucose, trace leukocytes, negative nitrites, many bacteria, 6-10 RBCs per high-power field and 0-5 WBCs.  Blood cultures x 2 pending.  Urine culture showed multiple species so we will recollect and CT showed bladder wall thickening likely due to incomplete distention or cystitis.  Continue IV Ceftriaxone  2 grams q24h. Follow-up on blood and urine culture and sensitivity. Delirium Precautions. Getting IVF w/ 1/2 NS but will reduce the rate from 125 mL/hr  to 75 mL/hr x 12 hours  Back pain In the setting of Lumbar mass (HCC) With associated Pathological fracture of Lumbar Vertebra: CT Abd/Pelvis showed Progressive, destructive mass involving the L1 vertebra with mild pathologic fracture, now measuring 3.3 x 3.2 cm, concerning for underlying malignancy and favoring osseous metastasis. Primary the site of disease is unknown. Have been unable to obtain MRI as patient has a bladder stimulator and also remote but they [employer] tech is coming tonight with a universal remote and placing her stimulator to MRI mode.  Follow-up on the MRI of the lumbar spine. Will obtain CT of the chest to look for primary and other sites of metastasis.  Consulted IR for lumbar spine biopsy and she underwent fluoroscopic guided right L1 bone mass biopsy.  Pending further workup may involve oncology   Hypothyroidism: Check TSH in the AM. Continue Levothyroxine  75 mcg p.o. daily.   Essential HTN (hypertension): CTM BP per Protocol. Last BP reading was 126/81. Use As needed oral or IV antihypertensives if necessary.   HLD: Continue Rosuvastatin  20 mg po Daily   Chronic Dementia: C/w Donepezil  10 mg po qHS   Hyperglycemia Due to Inulin Dependent Type 2 Diabetes Mellitus (HCC): Hold Jardiance  10 mg po Daily and Metformin  1000 mg po BID. Resume Insulin  Glargine but at 20 units. C/w Carbohydrate modified diet. Continue Moderate Novolog  SSI AC/HS. Check Hemoglobin A1c in the AM. CTM CBGs per Protocol. CBG Trend:  Recent Labs  Lab 01/16/24 2315 01/17/24 1800 01/17/24 2155 01/18/24 0743 01/18/24 1147  GLUCAP 304* 140* 242* 184* 179*   Macrocytic Anemia: Hgb/Hct went from 10.5/33.1 -> 11.1/34.8. Resukme Iron  Polysaccharides 150 mg po Daily. Check Anemia Panel. CTM for S/Sx of Bleeding; No overt bleeding noted. Repeat  CBC in the AM  GERD/GI Prophylaxis: C/w PPI w/ Pantoprazole  40 mg po Daily   Class II Obesity: Complicates overall prognosis and care. Estimated body mass index is  37.11 kg/m as calculated from the following:   Height as of this encounter: 5' 6 (1.676 m).   Weight as of this encounter: 104.3 kg. Weight Loss and Dietary Counseling given and would benefit from lifestyle modifications. Will need to follow-up closely with PCP.   DVT prophylaxis: enoxaparin  (LOVENOX ) injection 40 mg Start: 01/17/24 2200    Code Status: Full Code Family Communication: No family present @ bedside but talked to daughter over the telephone  Disposition Plan:  Level of care: Med-Surg Status is: Inpatient Remains inpatient appropriate because: Needs further clinical improvement and further evaluation of Mass and evaluation by PT/OT evaluation  Consultants:  IR  Procedures:  As delineated as above  Antimicrobials:  Anti-infectives (From admission, onward)    Start     Dose/Rate Route Frequency Ordered Stop   01/18/24 0600  cefTRIAXone  (ROCEPHIN ) 2 g in sodium chloride  0.9 % 100 mL IVPB        2 g 200 mL/hr over 30 Minutes Intravenous Every 24 hours 01/17/24 1305     01/17/24 0530  cefTRIAXone  (ROCEPHIN ) 1 g in sodium chloride  0.9 % 100 mL IVPB        1 g 200 mL/hr over 30 Minutes Intravenous  Once 01/17/24 0519 01/17/24 0630   01/17/24 0415  fluconazole  (DIFLUCAN ) tablet 150 mg        150 mg Oral  Once 01/17/24 0408 01/17/24 0456       Subjective: Seen and examined at bedside states that she is hungry and wanting to eat and also complains of back pain.  Thinks this is because she is laying down.  No nausea or vomiting.  Feels okay otherwise.  Slightly confused and pleasantly demented.  Objective: Vitals:   01/18/24 1455 01/18/24 1500 01/18/24 1505 01/18/24 1510  BP:  125/71 124/69 126/81  Pulse: 71 68 68 68  Resp: (!) 25 16 18 17   Temp:      TempSrc:      SpO2: 96% 96% 99% 97%  Weight:      Height:        Intake/Output Summary (Last 24 hours) at 01/18/2024 1626 Last data filed at 01/18/2024 1500 Gross per 24 hour  Intake 1100 ml  Output 800 ml  Net  300 ml   Filed Weights   01/17/24 0041  Weight: 104.3 kg   Examination: Physical Exam:  Constitutional: WN/WD obese chronically ill-appearing elderly Caucasian female Respiratory: Diminished to auscultation bilaterally, no wheezing, rales, rhonchi or crackles. Normal respiratory effort and patient is not tachypenic. No accessory muscle use.  Unlabored breathing Cardiovascular: RRR, no murmurs / rubs / gallops. S1 and S2 auscultated.  Mild extremity edema Abdomen: Soft, non-tender, distended secondary to body habitus. Bowel sounds positive.  GU: Deferred. Musculoskeletal: No clubbing / cyanosis of digits/nails. No joint deformity upper and lower extremities.  Skin: No rashes, lesions, ulcers limited skin evaluation. No induration; Warm and dry.  Neurologic: CN 2-12 grossly intact with no focal deficits. Romberg sign and cerebellar reflexes not assessed.  Psychiatric: Pleasantly confused and demented and she is awake and alert and appears calm  Data Reviewed: I have personally reviewed following labs and imaging studies  CBC: Recent Labs  Lab 01/17/24 0047 01/18/24 0435  WBC 8.2 6.0  NEUTROABS 5.3  --   HGB 10.5* 11.1*  HCT  33.1* 34.8*  MCV 102.5* 100.9*  PLT 213 223   Basic Metabolic Panel: Recent Labs  Lab 01/17/24 0047 01/18/24 0435  NA 135 135  K 4.4 4.3  CL 100 100  CO2 24 22  GLUCOSE 301* 180*  BUN 20 15  CREATININE 1.03* 0.81  CALCIUM  10.0 10.3   GFR: Estimated Creatinine Clearance: 65.3 mL/min (by C-G formula based on SCr of 0.81 mg/dL). Liver Function Tests: Recent Labs  Lab 01/17/24 0047 01/18/24 0435  AST 23 24  ALT 11 10  ALKPHOS 68 64  BILITOT 0.6 0.6  PROT 8.3* 8.4*  ALBUMIN 3.9 3.8   No results for input(s): LIPASE, AMYLASE in the last 168 hours. No results for input(s): AMMONIA in the last 168 hours. Coagulation Profile: Recent Labs  Lab 01/17/24 0047  INR 1.0   Cardiac Enzymes: No results for input(s): CKTOTAL, CKMB,  CKMBINDEX, TROPONINI in the last 168 hours. BNP (last 3 results) No results for input(s): PROBNP in the last 8760 hours. HbA1C: No results for input(s): HGBA1C in the last 72 hours. CBG: Recent Labs  Lab 01/16/24 2315 01/17/24 1800 01/17/24 2155 01/18/24 0743 01/18/24 1147  GLUCAP 304* 140* 242* 184* 179*   Lipid Profile: No results for input(s): CHOL, HDL, LDLCALC, TRIG, CHOLHDL, LDLDIRECT in the last 72 hours. Thyroid  Function Tests: No results for input(s): TSH, T4TOTAL, FREET4, T3FREE, THYROIDAB in the last 72 hours. Anemia Panel: No results for input(s): VITAMINB12, FOLATE, FERRITIN, TIBC, IRON , RETICCTPCT in the last 72 hours. Sepsis Labs: Recent Labs  Lab 01/17/24 0226 01/17/24 0508  LATICACIDVEN 1.6 0.9   Recent Results (from the past 240 hours)  Blood Culture (routine x 2)     Status: None (Preliminary result)   Collection Time: 01/16/24 12:30 AM   Specimen: Right Antecubital; Blood  Result Value Ref Range Status   Specimen Description   Final    RIGHT ANTECUBITAL BOTTLES DRAWN AEROBIC AND ANAEROBIC Performed at [facility], [address] [place], [place] [address]    Special Requests   Final    Blood Culture adequate volume Performed at [facility], [address], [place], [place] [address]    Culture   Final    NO GROWTH 1 DAY Performed at [facility], [address], [place], [place] [address]    Report Status PENDING  Incomplete  Blood Culture (routine x 2)     Status: None (Preliminary result)   Collection Time: 01/17/24  5:58 AM   Specimen: BLOOD  Result Value Ref Range Status   Specimen Description   Final    BLOOD LEFT ANTECUBITAL Performed at [facility], [address], [place], [place] [address]    Special Requests   Final    BOTTLES DRAWN AEROBIC AND ANAEROBIC Blood Culture adequate volume Performed at [facility], [address] [place], [place] [address]    Culture   Final    NO GROWTH < 24 HOURS Performed at [facility], [address], [place], [place] [address]    Report Status PENDING  Incomplete  Urine Culture     Status: Abnormal   Collection Time: 01/17/24  9:22 AM   Specimen: Urine, Clean Catch  Result Value Ref Range Status   Specimen Description   Final    URINE, CLEAN CATCH Performed at [facility], [address] [place], [place] [address]    Special Requests   Final  NONE Performed at [facility], [address] [place], [place] [address]    Culture MULTIPLE SPECIES PRESENT, SUGGEST RECOLLECTION (A)  Final   Report Status 01/18/2024 FINAL  Final    Radiology Studies: IR BONE BIOPSY W IMG GUIDE Result Date: 01/18/2024 INDICATION: 83 year old female with history of L1 vertebral body mass of indeterminate etiology. EXAM: Fluoroscopic guided L1 vertebral body mass biopsy MEDICATIONS: None. ANESTHESIA/SEDATION: Moderate (conscious) sedation was employed during this procedure. A total of Versed  1 mg and Fentanyl  50 mcg was administered intravenously. Moderate Sedation Time: 20 minutes. The patient's level of consciousness and vital signs were monitored continuously by radiology nursing throughout the procedure under my direct supervision. FLUOROSCOPY TIME:  One hundred three mGy reference air kerma COMPLICATIONS: None immediate. PROCEDURE: Informed written consent was obtained from the patient after a thorough discussion of the procedural risks, benefits and alternatives. All questions were addressed. Maximal Sterile Barrier Technique was utilized including caps, mask, sterile gowns, sterile gloves, sterile drape, hand hygiene and skin antiseptic. A timeout was performed prior to the initiation of the procedure. Procedure was planned after isolating the L1 vertebral body fluoroscopically. Subdermal Local anesthesia was provided at  the planned needle entry site. A small skin nick was made. Under fluoroscopic visualization, a 13 gauge trocar was advanced and right transpedicular fashion into the posterior aspect of the L1 vertebral body at the site of the known mass on recent comparison CT. An 11 gauge bone biopsy device was then used to take a single core sample. The sample was placed in formalin. The introducer needle was removed. Hemostasis was achieved with brief manual compression. The patient tolerated the procedure well without immediate complication and was transferred back to the floor in good condition. IMPRESSION: Technically successful fluoroscopic guided L1 vertebral body mass biopsy. Ester Sides, MD [facility] Electronically Signed   By: Ester Sides M.D.   On: 01/18/2024 15:27   CT ABDOMEN PELVIS W CONTRAST Result Date: 01/17/2024 EXAM: CT ABDOMEN AND PELVIS WITH CONTRAST 01/17/2024 05:40:53 AM TECHNIQUE: CT of the abdomen and pelvis was performed with the administration of 100 mL of iohexol  (OMNIPAQUE ) 300 MG/ML solution. Multiplanar reformatted images are provided for review. Automated exposure control, iterative reconstruction, and/or weight-based adjustment of the mA/kV was utilized to reduce the radiation dose to as low as reasonably achievable. COMPARISON: 10/18/2023 CLINICAL HISTORY: urinary tract infection with generalized weakness and intermediate confusion. FINDINGS: LOWER CHEST: No acute abnormality. LIVER: The liver is unremarkable. GALLBLADDER AND BILE DUCTS: Status post cholecystectomy. Common bile duct measures up to 9 mm. No significant intrahepatic bile duct dilatation. SPLEEN: No acute abnormality. PANCREAS: The pancreas is normal in size and contour without focal lesion or ductal dilatation. ADRENAL GLANDS: No acute abnormality. KIDNEYS, URETERS AND BLADDER: No stones in the kidneys or ureters. No hydronephrosis. No perinephric or periureteral  stranding. The urinary bladder is partially decompressed. Bladder wall thickening may reflect incomplete distention but can also be seen with cystitis. GI AND BOWEL: Small hiatal hernia. The appendix is visualized and appears normal. Colonic diverticulosis. No signs of acute diverticulitis. There is no bowel obstruction. PERITONEUM AND RETROPERITONEUM: No ascites. No free air. Small fat-containing umbilical hernia. VASCULATURE: Aorta is normal in caliber. Aortic atherosclerosis. LYMPH NODES: No lymphadenopathy. REPRODUCTIVE ORGANS: The uterus appears normal. No adnexal mass. BONES AND SOFT TISSUES: Status post bilateral breast augmentation. There is a progressive, destructive mass involving the L1 vertebra with mild pathologic fracture measuring 3.3 x 3.2 cm,  image 26/2. On the previous examination this measured 1.7 x 1.4 cm. Concerning for underlying malignancy. Favor osseous metastasis. No focal soft tissue abnormality. IMPRESSION: 1. No acute findings within the abdomen and pelvis. 2. Bladder wall thickening, possibly due to incomplete distention or cystitis. 3. Progressive, destructive mass involving the L1 vertebra with mild pathologic fracture, now measuring 3.3 x 3.2 cm, concerning for underlying malignancy and favoring osseous metastasis. Primary the site of disease is unknown. Electronically signed by: Waddell Calk MD 01/17/2024 06:02 AM EST RP Workstation: [id number]   CT Head Wo Contrast Result Date: 01/17/2024 EXAM: CT HEAD WITHOUT CONTRAST 01/16/2024 11:56:27 PM TECHNIQUE: CT of the head was performed without the administration of intravenous contrast. Automated exposure control, iterative reconstruction, and/or weight based adjustment of the mA/kV was utilized to reduce the radiation dose to as low as reasonably achievable. COMPARISON: CT head 07/29/2021. CLINICAL HISTORY: Delirium FINDINGS: BRAIN AND VENTRICLES: No acute hemorrhage. No evidence of acute infarct. No hydrocephalus. No extra-axial  collection. No mass effect or midline shift. ORBITS: No acute abnormality. SINUSES: No acute abnormality. SOFT TISSUES AND SKULL: No acute soft tissue abnormality. No skull fracture. IMPRESSION: 1. No acute intracranial abnormality. Electronically signed by: Gilmore Molt 01/17/2024 12:03 AM EST RP Workstation: [id number]   DG Chest Port 1 View Result Date: 01/16/2024 EXAM: 1 VIEW(S) XRAY OF THE CHEST 01/16/2024 11:02:00 PM COMPARISON: CT chest abdomen and pelvis 10/18/2023. CLINICAL HISTORY: Questionable sepsis - evaluate for abnormality. FINDINGS: LUNGS AND PLEURA: No focal pulmonary opacity. No pleural effusion. No pneumothorax. HEART AND MEDIASTINUM: The heart is mildly enlarged. BONES AND SOFT TISSUES: No acute osseous abnormality. IMPRESSION: 1. No acute cardiopulmonary abnormality. 2. Mild cardiomegaly. Electronically signed by: Greig Pique MD 01/16/2024 11:19 PM EST RP Workstation: [id number]   Scheduled Meds:  donepezil   10 mg Oral QHS   enoxaparin  (LOVENOX ) injection  40 mg Subcutaneous Q24H   insulin  aspart  0-15 Units Subcutaneous TID WC   insulin  aspart  0-5 Units Subcutaneous QHS   insulin  glargine  20 Units Subcutaneous Q supper   iron  polysaccharides  150 mg Oral QPC supper   levothyroxine   75 mcg Oral Q0600   lidocaine   1 patch Transdermal Q24H   pantoprazole   40 mg Oral Daily   rosuvastatin   20 mg Oral QPM   Continuous Infusions:  sodium chloride      cefTRIAXone  (ROCEPHIN )  IV Stopped (01/18/24 9392)    LOS: 1 day   Alejandro Marker, DO [facility] Available via Epic secure chat 7am-7pm After these hours, please refer to coverage provider listed on [email or web address] 01/18/2024, 4:26 PM  "

## 2024-01-18 NOTE — Procedures (Signed)
 Interventional Radiology Procedure Note  Procedure:  Fluoroscopic guided right L1 bone mass biopsy  Findings: Please refer to procedural dictation for full description. Core bx from right aspect of L1 vertebral body.  Complications: None immediate  Estimated Blood Loss: < 5 mL  Recommendations: 1 hour bedrest. Follow Pathology results.   Ester Sides, MD

## 2024-01-18 NOTE — Consult Note (Signed)
 "  Chief Complaint: L1 paraspinal mass - IR consulted for image guided biopsy  Referring Provider(s): Sherrill Cable [place], [place]   Supervising Physician: Jennefer Rover  Patient Status: [facility] - In-pt  History of Present Illness: Jennifer Boyer is a 83 y.o. female with pmhx macrocytic anemia, osteoarthritis, dementia, type 2 diabetes, history of DKA type II, difficult intubation, fibromyalgia, GERD, hiatal hernia, migraine headaches, hypertension, hyperlipidemia, history of autoimmune thyroiditis, hypothyroidism, class II obesity, history of intractable lower back pain, history of herniated nucleus pulposus along spine, history intractable back pain.  She presented to the ED yesterday with generalized weakness and confusion, found to have UTI. During workup, CT abd/pelvis was obtained showing pathologic fracture with destructive paraspinal mass of L1. Concern for malignancy with unknown primary. IR now consulted for image guided biopsy of paraspinal mass.  Today patient with midline lower back pain, otherwise no additional complaints. Has been NPO since midnight.   Patient is Full Code  Past Medical History:  Diagnosis Date   Arthritis    OA   Autoimmune thyroiditis 01/17/2024   Complication of anesthesia    pt has been told she is hard to intubate.   Dementia (HCC)    Diabetes mellitus without complication (HCC)    Difficult intubation    Fibromyalgia    GERD (gastroesophageal reflux disease)    Headache    HX MIGRAINES  NONE IN LONG TIME   History of hiatal hernia    HLD (hyperlipidemia) 07/29/2021   Hypertension    Hypothyroidism     Past Surgical History:  Procedure Laterality Date   BREAST SURGERY     bilateral mastectomy   CHOLECYSTECTOMY     DILATATION & CURETTAGE/HYSTEROSCOPY WITH TRUECLEAR N/A 09/30/2012   Procedure: DILATATION & CURETTAGE/HYSTEROSCOPY WITH TRUECLEAR;  Surgeon: Lynwood FORBES Clubs II, MD;  Location: [facility] ORS;  Service: Gynecology;  Laterality: N/A;    DILATION AND CURETTAGE OF UTERUS     uterine polyp   KNEE SURGERY     LUMBAR LAMINECTOMY/DECOMPRESSION MICRODISCECTOMY Left 03/24/2017   Procedure: LEFT LUMBAR FIVE-SACRAL ONE MICRODISCECTOMY, LEFT LUMBAR FOUR-FIVE DECOMPRESSIVE LAMINECTOMY;  Surgeon: Onetha Kuba, MD;  Location: [facility] OR;  Service: Neurosurgery;  Laterality: Left;  Left L4-5 L5-S1 Laminectomy/Foraminotomy   LUMBAR LAMINECTOMY/DECOMPRESSION MICRODISCECTOMY Left 09/01/2017   Procedure: Microdiscectomy - Lumbar Five-Sacral One - left redo;  Surgeon: Onetha Kuba, MD;  Location[facility] OR;  Service: Neurosurgery;  Laterality: Left;  left   TONSILLECTOMY      Allergies: Patient has no known allergies.  Medications: Prior to Admission medications  Medication Sig Start Date End Date Taking? Authorizing Provider  acetaminophen  (TYLENOL ) 500 MG tablet Take 500-1,000 mg by mouth every 6 (six) hours as needed for moderate pain.    [provider]  ADMELOG SOLOSTAR 100 UNIT/ML KwikPen Inject into the skin. 09/14/23   [provider]  ascorbic acid  (VITAMIN C ) 500 MG tablet Take 1 tablet (500 mg total) by mouth daily. 10/24/23   Pokhrel, Laxman, MD  cholecalciferol  (VITAMIN D ) 1000 units tablet Take 1,000 Units by mouth once a week.     [provider]  donepezil  (ARICEPT ) 10 MG tablet Take 1 tablet (10 mg total) by mouth at bedtime. 03/15/23   Penumalli, Vikram R, MD  escitalopram  (LEXAPRO ) 10 MG tablet Take 10 mg by mouth daily. 09/26/21   [provider]  insulin  aspart (NOVOLOG ) 100 UNIT/ML FlexPen Before each meal 3 times a day, 140-199 - 4 units, 200-250 - 6 units, 251-299 - 8  units,  300-349 - 10 units,  350 or above 12 units. Insulin  PEN if approved, provide syringes and needles if needed. 08/05/21   Singh, Prashant K, MD  Insulin  Glargine (BASAGLAR  KWIKPEN) 100 UNIT/ML Inject 55 Units into the skin daily. Patient taking differently: Inject 35 Units into the skin daily. 08/05/21   Singh, Prashant K, MD  iron   polysaccharides (NIFEREX) 150 MG capsule Take 1 capsule (150 mg total) by mouth daily. 10/24/23   Pokhrel, Vernal, MD  JARDIANCE  10 MG TABS tablet Take 10 mg by mouth daily. 05/21/22   [provider]  levothyroxine  (SYNTHROID , LEVOTHROID) 75 MCG tablet Take 75 mcg by mouth daily before breakfast.    [provider]  lidocaine  (LIDODERM ) 5 % Place 1 patch onto the skin daily. Remove & Discard patch within 12 hours or as directed by MDApply to back Apply to intact skin to cover the most painful area. Apply the prescribed number of patches (maximum of 3), for up to 12 hours within a 24 hour period. 10/24/23   Pokhrel, Laxman, MD  memantine  (NAMENDA ) 10 MG tablet Take 1 tablet (10 mg total) by mouth 2 (two) times daily. 03/15/23   Penumalli, Vikram R, MD  metFORMIN  (GLUCOPHAGE ) 1000 MG tablet Take 1,000 mg by mouth 2 (two) times daily with a meal.    [provider]  ondansetron  (ZOFRAN ) 4 MG tablet Take 1 tablet (4 mg total) by mouth every 6 (six) hours as needed for nausea. 10/24/23   Pokhrel, Laxman, MD  pantoprazole  (PROTONIX ) 40 MG tablet Take 40 mg by mouth daily.    [provider]  phenazopyridine  (PYRIDIUM ) 200 MG tablet Take 1 tablet (200 mg total) by mouth 3 (three) times daily. 10/16/23   Ula Prentice SAUNDERS, MD  polyethylene glycol (MIRALAX  / GLYCOLAX ) 17 g packet Take 17 g by mouth daily. 10/24/23   Pokhrel, Laxman, MD  rosuvastatin  (CRESTOR ) 20 MG tablet Take 20 mg by mouth daily.    [provider]     No family history on file.  Social History   Socioeconomic History   Marital status: Married    Spouse name: Not on file   Number of children: 2   Years of education: 12   Highest education level: Not on file  Occupational History   Not on file  Tobacco Use   Smoking status: Never   Smokeless tobacco: Never  Vaping Use   Vaping status: Never Used  Substance and Sexual Activity   Alcohol use: No   Drug use: No   Sexual activity: Not on file   Other Topics Concern   Not on file  Social History Narrative   Pt lives at [facility] independent living in [place].       Social Drivers of Health   Tobacco Use: Low Risk (10/19/2023)   Patient History    Smoking Tobacco Use: Never    Smokeless Tobacco Use: Never    Passive Exposure: Not on file  Financial Resource Strain: Not on file  Food Insecurity: No Food Insecurity (01/17/2024)   Epic    Worried About Programme Researcher, Broadcasting/film/video in the Last Year: Never true    Ran Out of Food in the Last Year: Never true  Transportation Needs: No Transportation Needs (01/17/2024)   Epic    Lack of Transportation (Medical): No    Lack of Transportation (Non-Medical): No  Physical Activity: Not on file  Stress: Not on file  Social Connections: Moderately Isolated (01/17/2024)  Social Advertising Account Executive    Frequency of Communication with Friends and Family: More than three times a week    Frequency of Social Gatherings with Friends and Family: More than three times a week    Attends Religious Services: 1 to 4 times per year    Active Member of [employer] or Organizations: No    Attends Banker Meetings: Never    Marital Status: Divorced  Depression (PHQ2-9): Not on file  Alcohol Screen: Not on file  Housing: Low Risk (01/17/2024)   Epic    Unable to Pay for Housing in the Last Year: No    Number of Times Moved in the Last Year: 0    Homeless in the Last Year: No  Utilities: Not At Risk (01/17/2024)   Epic    Threatened with loss of utilities: No  Health Literacy: Not on file     Review of Systems: A 12 point ROS discussed and pertinent positives are indicated in the HPI above.  All other systems are negative.  Vital Signs: BP 136/64 (BP Location: Left Arm)   Pulse 68   Temp 98.2 F (36.8 C) (Oral)   Resp 16   Ht 5' 6 (1.676 m)   Wt 229 lb 15 oz (104.3 kg)   SpO2 99%   BMI 37.11 kg/m   Advance Care Plan: No documents on file  Physical Exam Vitals and  nursing note reviewed.  Constitutional:      Appearance: Normal appearance.  HENT:     Mouth/Throat:     Mouth: Mucous membranes are moist.     Pharynx: Oropharynx is clear.  Cardiovascular:     Rate and Rhythm: Normal rate and regular rhythm.  Pulmonary:     Effort: Pulmonary effort is normal.     Breath sounds: Normal breath sounds.  Abdominal:     Palpations: Abdomen is soft.     Tenderness: There is no abdominal tenderness.  Musculoskeletal:     Comments: + midline lower back pain  Skin:    General: Skin is warm and dry.  Neurological:     Mental Status: She is alert and oriented to person, place, and time. Mental status is at baseline.     Imaging: CT ABDOMEN PELVIS W CONTRAST Result Date: 01/17/2024 EXAM: CT ABDOMEN AND PELVIS WITH CONTRAST 01/17/2024 05:40:53 AM TECHNIQUE: CT of the abdomen and pelvis was performed with the administration of 100 mL of iohexol  (OMNIPAQUE ) 300 MG/ML solution. Multiplanar reformatted images are provided for review. Automated exposure control, iterative reconstruction, and/or weight-based adjustment of the mA/kV was utilized to reduce the radiation dose to as low as reasonably achievable. COMPARISON: 10/18/2023 CLINICAL HISTORY: urinary tract infection with generalized weakness and intermediate confusion. FINDINGS: LOWER CHEST: No acute abnormality. LIVER: The liver is unremarkable. GALLBLADDER AND BILE DUCTS: Status post cholecystectomy. Common bile duct measures up to 9 mm. No significant intrahepatic bile duct dilatation. SPLEEN: No acute abnormality. PANCREAS: The pancreas is normal in size and contour without focal lesion or ductal dilatation. ADRENAL GLANDS: No acute abnormality. KIDNEYS, URETERS AND BLADDER: No stones in the kidneys or ureters. No hydronephrosis. No perinephric or periureteral stranding. The urinary bladder is partially decompressed. Bladder wall thickening may reflect incomplete distention but can also be seen with cystitis. GI  AND BOWEL: Small hiatal hernia. The appendix is visualized and appears normal. Colonic diverticulosis. No signs of acute diverticulitis. There is no bowel obstruction. PERITONEUM AND RETROPERITONEUM: No ascites. No free air. Small fat-containing  umbilical hernia. VASCULATURE: Aorta is normal in caliber. Aortic atherosclerosis. LYMPH NODES: No lymphadenopathy. REPRODUCTIVE ORGANS: The uterus appears normal. No adnexal mass. BONES AND SOFT TISSUES: Status post bilateral breast augmentation. There is a progressive, destructive mass involving the L1 vertebra with mild pathologic fracture measuring 3.3 x 3.2 cm, image 26/2. On the previous examination this measured 1.7 x 1.4 cm. Concerning for underlying malignancy. Favor osseous metastasis. No focal soft tissue abnormality. IMPRESSION: 1. No acute findings within the abdomen and pelvis. 2. Bladder wall thickening, possibly due to incomplete distention or cystitis. 3. Progressive, destructive mass involving the L1 vertebra with mild pathologic fracture, now measuring 3.3 x 3.2 cm, concerning for underlying malignancy and favoring osseous metastasis. Primary the site of disease is unknown. Electronically signed by: Waddell Calk MD 01/17/2024 06:02 AM EST RP Workstation: [id number]   CT Head Wo Contrast Result Date: 01/17/2024 EXAM: CT HEAD WITHOUT CONTRAST 01/16/2024 11:56:27 PM TECHNIQUE: CT of the head was performed without the administration of intravenous contrast. Automated exposure control, iterative reconstruction, and/or weight based adjustment of the mA/kV was utilized to reduce the radiation dose to as low as reasonably achievable. COMPARISON: CT head 07/29/2021. CLINICAL HISTORY: Delirium FINDINGS: BRAIN AND VENTRICLES: No acute hemorrhage. No evidence of acute infarct. No hydrocephalus. No extra-axial collection. No mass effect or midline shift. ORBITS: No acute abnormality. SINUSES: No acute abnormality. SOFT TISSUES AND SKULL: No acute soft tissue  abnormality. No skull fracture. IMPRESSION: 1. No acute intracranial abnormality. Electronically signed by: Gilmore Molt 01/17/2024 12:03 AM EST RP Workstation: [id number]   DG Chest Port 1 View Result Date: 01/16/2024 EXAM: 1 VIEW(S) XRAY OF THE CHEST 01/16/2024 11:02:00 PM COMPARISON: CT chest abdomen and pelvis 10/18/2023. CLINICAL HISTORY: Questionable sepsis - evaluate for abnormality. FINDINGS: LUNGS AND PLEURA: No focal pulmonary opacity. No pleural effusion. No pneumothorax. HEART AND MEDIASTINUM: The heart is mildly enlarged. BONES AND SOFT TISSUES: No acute osseous abnormality. IMPRESSION: 1. No acute cardiopulmonary abnormality. 2. Mild cardiomegaly. Electronically signed by: Greig Pique MD 01/16/2024 11:19 PM EST RP Workstation: [id number]    Labs:  CBC: Recent Labs    10/19/23 0352 10/21/23 1007 01/17/24 0047 01/18/24 0435  WBC 7.5 8.1 8.2 6.0  HGB 9.9* 10.7* 10.5* 11.1*  HCT 32.2* 36.1 33.1* 34.8*  PLT 244 277 213 223    COAGS: Recent Labs    10/19/23 0352 01/17/24 0047  INR 1.0 1.0    BMP: Recent Labs    10/19/23 0352 10/21/23 1007 01/17/24 0047 01/18/24 0435  NA 137 136 135 135  K 4.6 4.4 4.4 4.3  CL 103 101 100 100  CO2 21* 23 24 22   GLUCOSE 196* 153* 301* 180*  BUN 28* 28* 20 15  CALCIUM  9.7 9.5 10.0 10.3  CREATININE 0.80  0.78 0.93 1.03* 0.81  GFRNONAA >60  >60 >60 54* >60    LIVER FUNCTION TESTS: Recent Labs    10/14/23 1648 01/17/24 0047 01/18/24 0435  BILITOT 0.5 0.6 0.6  AST 21 23 24   ALT 9 11 10   ALKPHOS 72 68 64  PROT 8.4* 8.3* 8.4*  ALBUMIN 4.2 3.9 3.8    TUMOR MARKERS: No results for input(s): AFPTM, CEA, CA199, CHROMGRNA in the last 8760 hours.  Assessment and Plan:  Jennifer Boyer is a 83 y.o. female with pmhx macrocytic anemia, osteoarthritis, dementia, type 2 diabetes, history of DKA type II, difficult intubation, fibromyalgia, GERD, hiatal hernia, migraine headaches, hypertension, hyperlipidemia, history  of autoimmune thyroiditis, hypothyroidism, class II obesity, history of  intractable lower back pain, history of herniated nucleus pulposus along spine, history intractable back pain.  She presented to the ED yesterday with generalized weakness and confusion, found to have UTI. During workup, CT abd/pelvis was obtained showing pathologic fracture with destructive paraspinal mass of L1. Concern for malignancy with unknown primary. IR now consulted for image guided biopsy of paraspinal mass.  Risks and benefits of L1 paraspinal mass biopsy was discussed with the patient and/or patient's family including, but not limited to bleeding, infection, damage to adjacent structures or low yield requiring additional tests.  All of the questions were answered and there is agreement to proceed.  Consent signed and in chart.   Thank you for allowing our service to participate in Jennifer Boyer 's care.  Electronically Signed: Kimble VEAR Clas, PA-C   01/18/2024, 9:36 AM      I spent a total of 20 Minutes    in face to face in clinical consultation, greater than 50% of which was counseling/coordinating care for L1 paraspinal mass biopsy   "

## 2024-01-18 NOTE — TOC Initial Note (Addendum)
 Transition of Care [facility]) - Initial/Assessment Note    Patient Details  Name: Jennifer Boyer MRN: [id number] Date of Birth: Sep 04, 1940  [facility]) CM/SW Contact:    Doneta Glenys DASEN, RN Phone Number: 01/18/2024, 10:58 AM  Clinical Narrative:                 [facility]. CM called [facility] to confirm resident status.Patient say Robert(son) [phone] will transport at discharge.No IP CM needs identified.  Expected Discharge Plan: Home/Self Care Barriers to Discharge: No Barriers Identified   Patient Goals and CMS Choice Patient states their goals for this hospitalization and ongoing recovery are:: [facility].gov Compare Post Acute Care list provided to:: Patient Choice offered to / list presented to : Patient [place] ownership interest in [facility].provided to:: Patient    Expected Discharge Plan and Services In-house Referral: NA Discharge Planning Services: CM Consult   Living arrangements for the past 2 months: Independent Living Facility ([facility])                 DME Arranged: N/A DME Agency: NA       HH Arranged: NA HH Agency: NA        Prior Living Arrangements/Services Living arrangements for the past 2 months: Independent [facility] [place]) Lives with:: Facility Resident Patient language and need for interpreter reviewed:: Yes Do you feel safe going back to the place where you live?: Yes      Need for Family Participation in Patient Care: No (Comment) Care giver support system in place?: Yes (comment) Current home services: DME (cane,walker,rollator) Criminal Activity/Legal Involvement Pertinent to Current Situation/Hospitalization: No - Comment as needed  Activities of Daily Living      Permission Sought/Granted Permission sought to share information with : Case Manager Permission granted to share information with : Yes,  Verbal Permission Granted  Share Information with NAME: Buchholz,[name]  Daughter,  [phone]   Lamar Fleeta Hallmark (son) [phone]           Emotional Assessment Appearance:: Appears stated age Attitude/Demeanor/Rapport: Engaged Affect (typically observed): Appropriate Orientation: : Oriented to Self, Oriented to Place, Oriented to  Time, Oriented to Situation Alcohol / Substance Use: Not Applicable Psych Involvement: No (comment)  Admission diagnosis:  Delirium [R41.0] Acute metabolic encephalopathy [G93.41] Patient Active Problem List   Diagnosis Date Noted   Autoimmune thyroiditis 01/17/2024   Morbid obesity (HCC) 01/17/2024   Neuropathy 01/17/2024   Type 2 diabetes mellitus with other specified complication (HCC) 01/17/2024   Hyperglycemia due to type 2 diabetes mellitus (HCC) 01/17/2024   Long term current use of insulin  (HCC) 01/17/2024   Class 2 obesity 01/17/2024   Acute metabolic encephalopathy 01/17/2024   Acute UTI (urinary tract infection) 01/17/2024   Pathological fracture of lumbar vertebra 01/17/2024   Lumbar mass (HCC) 01/17/2024   Intractable low back pain 10/19/2023   DKA, type 2 (HCC) 07/29/2021   Macrocytic anemia 07/29/2021   Hyperkalemia 07/29/2021   AKI (acute kidney injury) 07/29/2021   HTN (hypertension) 07/29/2021   Hypothyroidism 07/29/2021   HLD (hyperlipidemia) 07/29/2021   Prolapse of female genital organs 02/17/2018   Osteoarthritis 02/17/2018   HNP (herniated nucleus pulposus), lumbar 03/24/2017   PCP:  Dayna Motto, DO Pharmacy:   [facility] [address] [place] - [address] [place] [address] Phone: [phone] Fax: [phone]  [facility] [phone] -  [place], [place] - [address] AT [facility] OF [address] [place] [address] Phone: [phone] Fax: [phone]     Social Drivers of Health (SDOH) Social History: SDOH  Screenings   Food Insecurity: No Food Insecurity (01/17/2024)  Housing: Low Risk (01/17/2024)  Transportation Needs: No Transportation Needs (01/17/2024)  Utilities: Not At Risk (01/17/2024)  Social Connections: Moderately Isolated (01/17/2024)  Tobacco Use: Low Risk (10/19/2023)   SDOH Interventions:     Readmission Risk Interventions    01/18/2024   10:53 AM 10/21/2023   12:18 PM 10/19/2023    1:53 PM  Readmission Risk Prevention Plan  Transportation Screening Complete Complete Complete  PCP or Specialist Appt within 5-7 Days Complete Complete Complete  Home Care Screening Complete Complete Complete  Medication Review (RN CM) Complete Complete Complete

## 2024-01-18 NOTE — Hospital Course (Signed)
 Jennifer Boyer is a 83 y.o. female with medical history significant of macrocytic anemia, osteoarthritis, dementia, type 2 diabetes, history of DKA type II, difficult intubation, fibromyalgia, GERD, hiatal hernia, migraine headaches, hypertension, hyperlipidemia, history of autoimmune thyroiditis, hypothyroidism, class II obesity, history of intractable lower back pain, history of herniated nucleus pulposus along spine, history intractable back pain who was sent to the emergency department via EMS from [facility] due to generalized weakness and intermittent confusion.  Being admitted for acute metabolic encephalopathy in setting of suspected UTI and further workup revealed that she has a lumbar mass with associated pathological fracture which will need to workup given that is concerning for underlying malignancy.  Undergoing MRIs later now that we are obtaining a universal remote for her bladder stimulator to be put into MRI mode as she is also getting a lumbar spine biopsy of the bone mass.    Assessment and Plan:  Acute Metabolic Encephalopathy In the setting of suspected acute UTI (urinary tract infection) superimposed on Chronic Dementia: Admitted to MedSurg/inpatient. CT Head done and showed no acute intracranial abnormality. MRI Brain w/wo Contrast pending. UA showed hazy appearance with greater than 500 glucose, trace leukocytes, negative nitrites, many bacteria, 6-10 RBCs per high-power field and 0-5 WBCs.  Blood cultures x 2 pending.  Urine culture showed multiple species so we will recollect and CT showed bladder wall thickening likely due to incomplete distention or cystitis.  Continue IV Ceftriaxone  2 grams q24h. Follow-up on blood and urine culture and sensitivity. Delirium Precautions. Getting IVF w/ 1/2 NS but will reduce the rate from 125 mL/hr to 75 mL/hr x 12 hours  Back pain In the setting of Lumbar mass (HCC) With associated Pathological fracture of Lumbar Vertebra: CT  Abd/Pelvis showed Progressive, destructive mass involving the L1 vertebra with mild pathologic fracture, now measuring 3.3 x 3.2 cm, concerning for underlying malignancy and favoring osseous metastasis. Primary the site of disease is unknown. Have been unable to obtain MRI as patient has a bladder stimulator and also remote but they [employer] tech is coming tonight with a universal remote and placing her stimulator to MRI mode.  Follow-up on the MRI of the lumbar spine. Will obtain CT of the chest to look for primary and other sites of metastasis.  Consulted IR for lumbar spine biopsy and she underwent fluoroscopic guided right L1 bone mass biopsy.  Pending further workup may involve oncology   Hypothyroidism: Check TSH in the AM. Continue Levothyroxine  75 mcg p.o. daily.   Essential HTN (hypertension): CTM BP per Protocol. Last BP reading was 126/81. Use As needed oral or IV antihypertensives if necessary.   HLD: Continue Rosuvastatin  20 mg po Daily   Chronic Dementia: C/w Donepezil  10 mg po qHS   Hyperglycemia Due to Inulin Dependent Type 2 Diabetes Mellitus (HCC): Hold Jardiance  10 mg po Daily and Metformin  1000 mg po BID. Resume Insulin  Glargine but at 20 units. C/w Carbohydrate modified diet. Continue Moderate Novolog  SSI AC/HS. Check Hemoglobin A1c in the AM. CTM CBGs per Protocol. CBG Trend:  Recent Labs  Lab 01/16/24 2315 01/17/24 1800 01/17/24 2155 01/18/24 0743 01/18/24 1147  GLUCAP 304* 140* 242* 184* 179*   Macrocytic Anemia: Hgb/Hct went from 10.5/33.1 -> 11.1/34.8. Resukme Iron  Polysaccharides 150 mg po Daily. Check Anemia Panel. CTM for S/Sx of Bleeding; No overt bleeding noted. Repeat CBC in the AM  GERD/GI Prophylaxis: C/w PPI w/ Pantoprazole  40 mg po Daily   Class II Obesity: Complicates overall prognosis and  care. Estimated body mass index is 37.11 kg/m as calculated from the following:   Height as of this encounter: 5' 6 (1.676 m).   Weight as of this encounter: 104.3  kg. Weight Loss and Dietary Counseling given and would benefit from lifestyle modifications. Will need to follow-up closely with PCP.

## 2024-01-18 NOTE — Plan of Care (Signed)
  Problem: Education: Goal: Knowledge of General Education information will improve Description: Including pain rating scale, medication(s)/side effects and non-pharmacologic comfort measures Outcome: Progressing   Problem: Clinical Measurements: Goal: Ability to maintain clinical measurements within normal limits will improve Outcome: Progressing   Problem: Clinical Measurements: Goal: Diagnostic test results will improve Outcome: Progressing   

## 2024-01-18 NOTE — Progress Notes (Signed)
 As of 01/17/2024, both of the patients emergency contacts (son and daughter) were unable to find the Interstim remote and consulting civil engineer. Without these devices, the stimulator cannot be placed into MRI Mode and a [employer] representative advised us  to NOT proceed with the MRI due to safety concerns. The ordering MD and floor were made aware that we cannot proceed with MRI until the remote is found and the device is charged.

## 2024-01-18 NOTE — Plan of Care (Signed)

## 2024-01-19 ENCOUNTER — Encounter (HOSPITAL_COMMUNITY): Payer: Self-pay | Admitting: Internal Medicine

## 2024-01-19 ENCOUNTER — Inpatient Hospital Stay (HOSPITAL_COMMUNITY)

## 2024-01-19 DIAGNOSIS — I6340 Cerebral infarction due to embolism of unspecified cerebral artery: Secondary | ICD-10-CM

## 2024-01-19 DIAGNOSIS — D6869 Other thrombophilia: Secondary | ICD-10-CM | POA: Diagnosis not present

## 2024-01-19 DIAGNOSIS — R29701 NIHSS score 1: Secondary | ICD-10-CM | POA: Diagnosis not present

## 2024-01-19 DIAGNOSIS — K219 Gastro-esophageal reflux disease without esophagitis: Secondary | ICD-10-CM | POA: Diagnosis present

## 2024-01-19 DIAGNOSIS — G9341 Metabolic encephalopathy: Secondary | ICD-10-CM | POA: Diagnosis not present

## 2024-01-19 DIAGNOSIS — C801 Malignant (primary) neoplasm, unspecified: Secondary | ICD-10-CM

## 2024-01-19 DIAGNOSIS — F0390 Unspecified dementia without behavioral disturbance: Secondary | ICD-10-CM | POA: Diagnosis present

## 2024-01-19 DIAGNOSIS — I6782 Cerebral ischemia: Secondary | ICD-10-CM | POA: Diagnosis present

## 2024-01-19 LAB — CBC WITH DIFFERENTIAL/PLATELET
Abs Immature Granulocytes: 0.01 K/uL (ref 0.00–0.07)
Basophils Absolute: 0 K/uL (ref 0.0–0.1)
Basophils Relative: 0 %
Eosinophils Absolute: 0.1 K/uL (ref 0.0–0.5)
Eosinophils Relative: 1 %
HCT: 33.9 % — ABNORMAL LOW (ref 36.0–46.0)
Hemoglobin: 10.9 g/dL — ABNORMAL LOW (ref 12.0–15.0)
Immature Granulocytes: 0 %
Lymphocytes Relative: 42 %
Lymphs Abs: 2.4 K/uL (ref 0.7–4.0)
MCH: 32.6 pg (ref 26.0–34.0)
MCHC: 32.2 g/dL (ref 30.0–36.0)
MCV: 101.5 fL — ABNORMAL HIGH (ref 80.0–100.0)
Monocytes Absolute: 0.5 K/uL (ref 0.1–1.0)
Monocytes Relative: 8 %
Neutro Abs: 2.8 K/uL (ref 1.7–7.7)
Neutrophils Relative %: 49 %
Platelets: 213 K/uL (ref 150–400)
RBC: 3.34 MIL/uL — ABNORMAL LOW (ref 3.87–5.11)
RDW: 14.8 % (ref 11.5–15.5)
WBC: 5.8 K/uL (ref 4.0–10.5)
nRBC: 0 % (ref 0.0–0.2)

## 2024-01-19 LAB — COMPREHENSIVE METABOLIC PANEL WITH GFR
ALT: 9 U/L (ref 0–44)
AST: 23 U/L (ref 15–41)
Albumin: 3.9 g/dL (ref 3.5–5.0)
Alkaline Phosphatase: 65 U/L (ref 38–126)
Anion gap: 11 (ref 5–15)
BUN: 14 mg/dL (ref 8–23)
CO2: 24 mmol/L (ref 22–32)
Calcium: 9.9 mg/dL (ref 8.9–10.3)
Chloride: 102 mmol/L (ref 98–111)
Creatinine, Ser: 0.72 mg/dL (ref 0.44–1.00)
GFR, Estimated: >60 mL/min
Glucose, Bld: 156 mg/dL — ABNORMAL HIGH (ref 70–99)
Potassium: 3.8 mmol/L (ref 3.5–5.1)
Sodium: 136 mmol/L (ref 135–145)
Total Bilirubin: 0.6 mg/dL (ref 0.0–1.2)
Total Protein: 8.5 g/dL — ABNORMAL HIGH (ref 6.5–8.1)

## 2024-01-19 LAB — FERRITIN: Ferritin: 69 ng/mL (ref 11–307)

## 2024-01-19 LAB — TSH: TSH: 4.14 u[IU]/mL (ref 0.350–4.500)

## 2024-01-19 LAB — RETICULOCYTES
Immature Retic Fract: 24.6 % — ABNORMAL HIGH (ref 2.3–15.9)
RBC.: 3.44 MIL/uL — ABNORMAL LOW (ref 3.87–5.11)
Retic Count, Absolute: 70.2 K/uL (ref 19.0–186.0)
Retic Ct Pct: 2 % (ref 0.4–3.1)

## 2024-01-19 LAB — URINE CULTURE: Culture: NO GROWTH

## 2024-01-19 LAB — GLUCOSE, CAPILLARY
Glucose-Capillary: 182 mg/dL — ABNORMAL HIGH (ref 70–99)
Glucose-Capillary: 235 mg/dL — ABNORMAL HIGH (ref 70–99)
Glucose-Capillary: 415 mg/dL — ABNORMAL HIGH (ref 70–99)
Glucose-Capillary: 236 mg/dL — ABNORMAL HIGH (ref 70–99)

## 2024-01-19 LAB — VITAMIN B12: Vitamin B-12: 3147 pg/mL — ABNORMAL HIGH (ref 180–914)

## 2024-01-19 LAB — MAGNESIUM: Magnesium: 1.9 mg/dL (ref 1.7–2.4)

## 2024-01-19 LAB — IRON AND TIBC
Iron: 73 ug/dL (ref 28–170)
Saturation Ratios: 17 % (ref 10.4–31.8)
TIBC: 417 ug/dL (ref 250–450)
UIBC: 345 ug/dL

## 2024-01-19 LAB — FOLATE: Folate: >20 ng/mL

## 2024-01-19 LAB — PHOSPHORUS: Phosphorus: 4 mg/dL (ref 2.5–4.6)

## 2024-01-19 MED ORDER — CLOPIDOGREL BISULFATE 75 MG PO TABS
75.0000 mg | ORAL_TABLET | Freq: Every day | ORAL | Status: DC
  Administered 2024-01-20 – 2024-01-21 (×2): 75 mg via ORAL
  Filled 2024-01-19 (×2): qty 1

## 2024-01-19 MED ORDER — IOHEXOL 350 MG/ML SOLN
75.0000 mL | Freq: Once | INTRAVENOUS | Status: AC | PRN
  Administered 2024-01-19: 75 mL via INTRAVENOUS

## 2024-01-19 MED ORDER — ESCITALOPRAM OXALATE 10 MG PO TABS
10.0000 mg | ORAL_TABLET | Freq: Every day | ORAL | Status: DC
  Administered 2024-01-19 – 2024-01-21 (×3): 10 mg via ORAL
  Filled 2024-01-19 (×3): qty 1

## 2024-01-19 MED ORDER — MEMANTINE HCL 10 MG PO TABS: 10.0000 mg | ORAL_TABLET | Freq: Two times a day (BID) | ORAL | Status: DC

## 2024-01-19 MED ORDER — INSULIN ASPART 100 UNIT/ML IJ SOLN
4.0000 [IU] | Freq: Three times a day (TID) | INTRAMUSCULAR | Status: DC
  Administered 2024-01-20 – 2024-01-21 (×5): 4 [IU] via SUBCUTANEOUS
  Filled 2024-01-19 (×6): qty 4

## 2024-01-19 MED ORDER — INSULIN ASPART 100 UNIT/ML IJ SOLN
0.0000 [IU] | Freq: Three times a day (TID) | INTRAMUSCULAR | Status: DC
  Administered 2024-01-20: 5 [IU] via SUBCUTANEOUS
  Administered 2024-01-20 – 2024-01-21 (×3): 3 [IU] via SUBCUTANEOUS
  Administered 2024-01-21 (×2): 5 [IU] via SUBCUTANEOUS
  Filled 2024-01-19: qty 9
  Filled 2024-01-19 (×2): qty 5
  Filled 2024-01-19 (×3): qty 3

## 2024-01-19 MED ORDER — ASPIRIN 81 MG PO TBEC
81.0000 mg | DELAYED_RELEASE_TABLET | Freq: Every day | ORAL | Status: DC
  Administered 2024-01-19 – 2024-01-21 (×3): 81 mg via ORAL
  Filled 2024-01-19 (×3): qty 1

## 2024-01-19 MED ORDER — CLOPIDOGREL BISULFATE 75 MG PO TABS
300.0000 mg | ORAL_TABLET | Freq: Once | ORAL | Status: AC
  Administered 2024-01-19: 300 mg via ORAL
  Filled 2024-01-19: qty 4

## 2024-01-19 MED ORDER — OXYCODONE HCL 5 MG PO TABS
5.0000 mg | ORAL_TABLET | ORAL | Status: DC | PRN
  Administered 2024-01-19 (×2): 5 mg via ORAL
  Filled 2024-01-19 (×2): qty 1

## 2024-01-19 MED ORDER — STROKE: EARLY STAGES OF RECOVERY BOOK
Freq: Once | Status: AC
  Filled 2024-01-19: qty 1

## 2024-01-19 MED ORDER — INSULIN ASPART 100 UNIT/ML IJ SOLN
INTRAMUSCULAR | Status: AC
  Administered 2024-01-19: 15 [IU] via SUBCUTANEOUS
  Filled 2024-01-19: qty 15

## 2024-01-19 NOTE — Assessment & Plan Note (Addendum)
 A1c 7.8, suboptimal control Hold Glucophage , Jardiance  Continue glargine Cover with moderate-scale SSI Carb modified diet

## 2024-01-19 NOTE — Care Management Important Message (Signed)
 Important Message  Patient Details IM Letter given. Name: LILYANN GRAVELLE MRN: [id number] Date of Birth: 1940/11/22   Important Message Given:  Yes - Medicare IM     Shaday Rayborn 01/19/2024, 12:32 PM

## 2024-01-19 NOTE — Assessment & Plan Note (Addendum)
 No home meds Current BP 138/64

## 2024-01-19 NOTE — Consult Note (Signed)
 NEUROLOGY CONSULT NOTE   Date of service: January 19, 2024 Patient Name: Jennifer Boyer MRN:  [id number] DOB:  19-Jun-1940 Chief Complaint: confusion Requesting Provider: Barbarann Nest, MD  History of Present Illness  Jennifer Boyer is a 83 y.o. female with hx of htn, hld, dm2 who presented with confusion that started at least by Christmas Eve, but was mostly noticed on subsequent days.  Due to the confusion, she was brought to the emergency department on 12/29, and an MRI was desired but unfortunately due to her bladder stimulator there was a delay but once her remote was procured, an MRI was obtained last night which demonstrates embolic appearing strokes in the right MCA territory.  LKW: 12/23  NIHSS components Score: Comment  1a Level of Conscious 0[x]  1[]  2[]  3[]      1b LOC Questions 0[]  1[]  2[x]       1c LOC Commands 0[x]  1[]  2[]       2 Best Gaze 0[x]  1[]  2[]       3 Visual 0[x]  1[]  2[]  3[]      4 Facial Palsy 0[x]  1[]  2[]  3[]      5a Motor Arm - left 0[x]  1[]  2[]  3[]  4[]  UN[]    5b Motor Arm - Right 0[x]  1[]  2[]  3[]  4[]  UN[]    6a Motor Leg - Left 0[x]  1[]  2[]  3[]  4[]  UN[]    6b Motor Leg - Right 0[x]  1[]  2[]  3[]  4[]  UN[]    7 Limb Ataxia 0[x]  1[]  2[]  UN[]      8 Sensory 0[x]  1[]  2[]  UN[]      9 Best Language 0[x]  1[]  2[]  3[]      10 Dysarthria 0[x]  1[]  2[]  UN[]      11 Extinct. and Inattention 0[]  1[x]  2[]       TOTAL: 1    Past History   Past Medical History:  Diagnosis Date   Arthritis    OA   Autoimmune thyroiditis 01/17/2024   Complication of anesthesia    pt has been told she is hard to intubate.   Dementia (HCC)    Diabetes mellitus without complication (HCC)    Difficult intubation    Fibromyalgia    GERD (gastroesophageal reflux disease)    Headache    HX MIGRAINES  NONE IN LONG TIME   History of hiatal hernia    HLD (hyperlipidemia) 07/29/2021   Hypertension    Hypothyroidism     Past Surgical History:  Procedure Laterality Date   BREAST SURGERY      bilateral mastectomy   CHOLECYSTECTOMY     DILATATION & CURETTAGE/HYSTEROSCOPY WITH TRUECLEAR N/A 09/30/2012   Procedure: DILATATION & CURETTAGE/HYSTEROSCOPY WITH TRUECLEAR;  Surgeon: Lynwood FORBES Clubs II, MD;  Location: [facility];  Service: Gynecology;  Laterality: N/A;   DILATION AND CURETTAGE OF UTERUS     uterine polyp   IR IVC FILTER RETRIEVAL / S&I /IMG GUID/MOD SED  01/18/2024   KNEE SURGERY     LUMBAR LAMINECTOMY/DECOMPRESSION MICRODISCECTOMY Left 03/24/2017   Procedure: LEFT LUMBAR FIVE-SACRAL ONE MICRODISCECTOMY, LEFT LUMBAR FOUR-FIVE DECOMPRESSIVE LAMINECTOMY;  Surgeon: Onetha Kuba, MD;  Location: [facility] OR;  Service: Neurosurgery;  Laterality: Left;  Left L4-5 L5-S1 Laminectomy/Foraminotomy   LUMBAR LAMINECTOMY/DECOMPRESSION MICRODISCECTOMY Left 09/01/2017   Procedure: Microdiscectomy - Lumbar Five-Sacral One - left redo;  Surgeon: Onetha Kuba, MD;  Location: [facility];  Service: Neurosurgery;  Laterality: Left;  left   TONSILLECTOMY      Family History: No family history on file.  Social History  reports that she has never smoked. She  has never used smokeless tobacco. She reports that she does not drink alcohol and does not use drugs.  Allergies[1]  Medications  Current Medications[2]  Vitals   Vitals:   01/18/24 1656 01/18/24 1932 01/19/24 0513 01/19/24 1134  BP: (!) 144/63 136/69 (!) 143/61 138/64  Pulse: (!) 58 72 68 74  Resp: 16 18 18 18   Temp: 97.7 F (36.5 C) 98.5 F (36.9 C) 98.2 F (36.8 C) (!) 97.4 F (36.3 C)  TempSrc: Oral Oral Oral Oral  SpO2: 100% 98% 97% 96%  Weight:      Height:        Body mass index is 37.11 kg/m.   Physical Exam   Constitutional: Appears well-developed and well-nourished.   Neurologic Examination    Neuro: Mental Status: Patient is awake, alert, oriented to person, place, month, year, and situation. Patient is able to give a clear and coherent history. No signs of aphasia  She does have mild neglect with extinction of double  simultaneous stimulation in the legs Cranial Nerves: II: Visual Fields are full. Pupils are equal, round, and reactive to light.   III,IV, VI: EOMI without ptosis or diploplia.  V: Facial sensation is symmetric to temperature VII: Facial movement is symmetric.  VIII: hearing is intact to voice X: Uvula elevates symmetrically XII: tongue is midline without atrophy or fasciculations.  Motor: Tone is normal. Bulk is normal. 5/5 strength was present in all four extremities.  Sensory: Sensation is symmetric to light touch and temperature in the arms and legs. Cerebellar: FNF and HKS are intact bilaterally    Labs/Imaging/Neurodiagnostic studies   CBC:  Recent Labs  Lab 01/31/24 0047 01/18/24 0435 01/19/24 0500  WBC 8.2 6.0 5.8  NEUTROABS 5.3  --  2.8  HGB 10.5* 11.1* 10.9*  HCT 33.1* 34.8* 33.9*  MCV 102.5* 100.9* 101.5*  PLT 213 223 213   Basic Metabolic Panel:  Lab Results  Component Value Date   NA 136 01/19/2024   K 3.8 01/19/2024   CO2 24 01/19/2024   GLUCOSE 156 (H) 01/19/2024   BUN 14 01/19/2024   CREATININE 0.72 01/19/2024   CALCIUM  9.9 01/19/2024   GFRNONAA >60 01/19/2024   GFRAA >60 08/24/2017   Lipid Panel: No results found for: LDLCALC HgbA1c:  Lab Results  Component Value Date   HGBA1C 7.8 (H) 10/19/2023   Urine Drug Screen: No results found for: LABOPIA, COCAINSCRNUR, LABBENZ, AMPHETMU, THCU, LABBARB  Alcohol Level No results found for: [facility] INR  Lab Results  Component Value Date   INR 1.0 31-Jan-2024   CT angio Head and Neck with contrast(Personally reviewed): She has stenosis of both branches of the MCA bifurcation   MRI Brain(Personally reviewed): Patchy embolic appearing MCA infarcts.   ASSESSMENT   Jennifer Boyer is a 83 y.o. female with patchy MCA infarcts.  The severity of the stenosis of the MCA bifurcation coupled with the appearance of the infarcts there I suspect this is recanalized thrombus as opposed to  atherosclerotic stenosis.  In the setting of likely malignancy, hypercoagulable state would have to be considered.  The other possibility is that this is embolic phenomena from a cardiac source.  She will need a cardiac workup with echo, telemetry, consideration of a 30-day monitor on discharge if no source is found.  If any other evidence of hypercoagulability associated with her malignancy or found, then I would consider starting anticoagulants, but not with just this one event.  RECOMMENDATIONS  echo, telemetry A1c, lipids ASA 81  mg and Plavix 75 mg following 300 mg load PT, OT, ST Neurology will follow workup remotely.  ______________________________________________________________________    Bonney Aisha Seals, MD [facility]     [1] No Known Allergies [2]  Current Facility-Administered Medications:    acetaminophen  (TYLENOL ) tablet 650 mg, 650 mg, Oral, Q6H PRN, 650 mg at 01/19/24 0520 **OR** acetaminophen  (TYLENOL ) suppository 650 mg, 650 mg, Rectal, Q6H PRN, Celinda Alm Lot, MD   cefTRIAXone  (ROCEPHIN ) 2 g in sodium chloride  0.9 % 100 mL IVPB, 2 g, Intravenous, Q24H, Celinda Alm Lot, MD, Last Rate: 200 mL/hr at 01/19/24 0522, 2 g at 01/19/24 0522   donepezil  (ARICEPT ) tablet 10 mg, 10 mg, Oral, QHS, Celinda Alm Lot, MD, 10 mg at 01/18/24 2133   enoxaparin  (LOVENOX ) injection 40 mg, 40 mg, Subcutaneous, Q24H, Celinda Alm Lot, MD, 40 mg at 01/18/24 2132   insulin  aspart (novoLOG ) injection 0-15 Units, 0-15 Units, Subcutaneous, TID WC, Ortiz, David Manuel, MD, 5 Units at 01/19/24 1304   insulin  aspart (novoLOG ) injection 0-5 Units, 0-5 Units, Subcutaneous, QHS, Celinda Alm Lot, MD, 2 Units at 01/18/24 2300   insulin  glargine (LANTUS ) injection 20 Units, 20 Units, Subcutaneous, Q24H, Sheikh, Omair Latif, [place], 20 Units at 01/18/24 2133   iron  polysaccharides (NIFEREX) capsule 150 mg, 150 mg, Oral, QPC supper, Sheikh, Omair Latif, DO, 150 mg at  01/18/24 1956   levothyroxine  (SYNTHROID ) tablet 75 mcg, 75 mcg, Oral, Q0600, Celinda Alm Lot, MD, 75 mcg at 01/19/24 9479   lidocaine  (LIDODERM ) 5 % 1 patch, 1 patch, Transdermal, Q24H, Celinda Alm Lot, MD, 1 patch at 01/18/24 2133   LORazepam  (ATIVAN ) tablet 0.5 mg, 0.5 mg, Oral, Once PRN, Sponseller, Rebekah R, PA-C   ondansetron  (ZOFRAN ) tablet 4 mg, 4 mg, Oral, Q6H PRN **OR** ondansetron  (ZOFRAN ) injection 4 mg, 4 mg, Intravenous, Q6H PRN, Celinda Alm Lot, MD   oxyCODONE  (Oxy IR/ROXICODONE ) immediate release tablet 5 mg, 5 mg, Oral, Q4H PRN, Barbarann Nest, MD, 5 mg at 01/19/24 1041   pantoprazole  (PROTONIX ) EC tablet 40 mg, 40 mg, Oral, Daily, Celinda Alm Lot, MD, 40 mg at 01/19/24 9161   rosuvastatin  (CRESTOR ) tablet 20 mg, 20 mg, Oral, QPM, Sheikh, Omair Latif, DO, 20 mg at 01/18/24 8277

## 2024-01-19 NOTE — Assessment & Plan Note (Addendum)
"   Continue PPI  "

## 2024-01-19 NOTE — Assessment & Plan Note (Addendum)
 CT Abd/Pelvis showed progressive, destructive mass involving the L1 vertebra with mild pathologic fracture, now measuring 3.3 x 3.2 cm, concerning for underlying malignancy and favoring osseous metastasis Primary the site of disease is unknown MRI of the lumbar spine witht soft tissue lesion of the L1 vertebral body, concerning for malignancy, with 25% anterior height loss CT of the chest ordered to look for primary and other sites of metastasis, showed 2 small nodules in RUL and RLL, nonspecific Consulted IR, underwent fluoroscopic guided right L1 bone mass biopsy on 12/30; pathology is pending   Further workup may involve oncology once pathology results are known

## 2024-01-19 NOTE — Progress Notes (Signed)
 " Progress Note   Patient: Jennifer Boyer FMW:[id number] DOB: 1940/06/27 DOA: 01/16/2024     2 DOS: the patient was seen and examined on 01/19/2024   Brief hospital course: 83yo with h/o macrocytic anemia, osteoarthritis, dementia, type 2 diabetes, fibromyalgia, GERD, hypertension, hyperlipidemia, hypothyroidism, and chronic intractable lower back pain who presented from [facility] on 12/28 due to generalized weakness and intermittent confusion.  Admitted for acute metabolic encephalopathy in setting of suspected UTI.  Further workup revealed that she has a lumbar mass with associated pathological fracture that is concerning for underlying malignancy.  MRI with multifocal acute ischemia in the R MCA territory with small associated petechial hemorrhage; also with new enhancing 9mm calvarial lesion that appears to be benign.  Underwent lumbar spine bone biopsy 12/30.      Assessment & Plan Acute UTI (urinary tract infection) Patient presented with generalized weakness and intermittent confusion She has h/o recurrent UTI and chronic dementia Admitted to MedSurg/inpatient UA showed hazy appearance with greater than 500 glucose, trace leukocytes, negative nitrites, many bacteria, 6-10 RBCs per high-power field and 0-5 WBCs CT showed bladder wall thickening likely due to incomplete distention or cystitis Initial urine culture with multiple species so recollected and repeat culture is pending Blood cultures NTD Continue IV Ceftriaxone  2 grams q24h Pathological fracture of lumbar vertebra Lumbar mass (HCC) CT Abd/Pelvis showed progressive, destructive mass involving the L1 vertebra with mild pathologic fracture, now measuring 3.3 x 3.2 cm, concerning for underlying malignancy and favoring osseous metastasis Primary the site of disease is unknown MRI of the lumbar spine witht soft tissue lesion of the L1 vertebral body, concerning for malignancy, with 25% anterior height loss CT of the chest  ordered to look for primary and other sites of metastasis, showed 2 small nodules in RUL and RLL, nonspecific Consulted IR, underwent fluoroscopic guided right L1 bone mass biopsy on 12/30; pathology is pending   Further workup may involve oncology once pathology results are known Acute cerebral ischemia Acute encephalopathy was initially thought to be due to acute UTI CT head showed no acute intracranial abnormality MRI Brain showed multifocal acute ischemia within the R MCA territory Also a probably benign new 9 mm calvarial lesion Given acute ischemia, neurology consulted Transfer to telemetry Echo ordered PT/OT consulting, recommending home health PT/OT Headache since presentation, treated with APAP and Oxy Hypothyroidism TSH WNL Continue Synthroid  HTN (hypertension) No home meds Current BP 138/64 Normocytic anemia Stable, normocytic - likely anemia of chronic disease Resume Iron  Polysaccharides 150 mg po daily Normal anemia panel HLD (hyperlipidemia) Continue rosuvastatin  Hyperglycemia due to type 2 diabetes mellitus (HCC) A1c 7.8, suboptimal control Hold Glucophage , Jardiance  Continue glargine Cover with moderate-scale SSI Carb modified diet  Dementia without behavioral disturbance (HCC) Continue donepezil , memantine  Resume escitalopram  GERD (gastroesophageal reflux disease) Continue PPI Class 2 obesity Body mass index is 37.11 kg/m.SABRA  Weight loss should be encouraged Outpatient PCP/bariatric medicine f/u encouraged Significantly low or high BMI is associated with higher medical risk including morbidity and mortality       Consultants: Neurology IR  Procedures: Fluoroscopic-guided R L1 bone mass biopsy 12/30 Echocardiogram  Antibiotics: Ceftriaxone  12/29-  30 Day Unplanned Readmission Risk Score    Flowsheet Row ED to Hosp-Admission (Current) from 01/16/2024 in [facility]  30 Day Unplanned Readmission Risk  Score (%) 19.01 Filed at 01/19/2024 0801    This score is the patient's risk of an unplanned readmission within 30 days of being discharged (0 -  100%). The score is based on dignosis, age, lab data, medications, orders, and past utilization.   Low:  0-14.9   Medium: 15-21.9   High: 22-29.9   Extreme: 30 and above           Subjective: Continues to complain of R frontal headache.  Still mildly confused.  No other acute concerns.  Accompanied by her son/HCPOA.   Objective: Vitals:   01/19/24 0513 01/19/24 1134  BP: (!) 143/61 138/64  Pulse: 68 74  Resp: 18 18  Temp: 98.2 F (36.8 C) (!) 97.4 F (36.3 C)  SpO2: 97% 96%    Intake/Output Summary (Last 24 hours) at 01/19/2024 1547 Last data filed at 01/19/2024 0900 Gross per 24 hour  Intake 180 ml  Output 400 ml  Net -220 ml   Filed Weights   01/17/24 0041  Weight: 104.3 kg    Exam:  General:  Appears calm and comfortable and is in NAD, up in bedside chair Eyes:  normal lids, iris ENT:  grossly normal hearing, lips & tongue, mmm Cardiovascular:  RRR. No LE edema.  Respiratory:   CTA bilaterally with no wheezes/rales/rhonchi.  Normal respiratory effort. Abdomen:  soft, NT, ND Skin:  no rash or induration seen on limited exam Musculoskeletal:  grossly normal tone BUE/BLE, good ROM, no bony abnormality Psychiatric:  blunted mood and affect, speech sparse but appropriate, AOx3 Neurologic:  CN 2-12 grossly intact, moves all extremities in coordinated fashion  Data Reviewed: I have reviewed the patient's lab results since admission.  Pertinent labs for today include:   Glucose 156 Normal anemia panel WBC 5.8 Hgb 10.9 TSH 4.140 Urine culture pending  Blood cultures NTD   Family Communication: Son was present  Mobility: PT/OT Consulted and are recommending - Home Health Pt12/31/2025 1400    Code Status: Full Code   Disposition: Status is: Inpatient Remains inpatient appropriate because: ongoing  management     Time spent: 50 minutes  Unresulted Labs (From admission, onward)     Start     Ordered   01/20/24 0500  Lipid panel  (Labs)  Tomorrow morning,   R       Comments: Fasting    01/19/24 1411   01/20/24 0500  Hemoglobin A1c  (Labs)  Tomorrow morning,   R       Comments: To assess prior glycemic control    01/19/24 1411   01/18/24 1300  Urine Culture (for pregnant, neutropenic or urologic patients or patients with an indwelling urinary catheter)  (Urine Labs)  Once,   R       Question:  Indication  Answer:  Dysuria   01/18/24 1259             Author: Delon Herald, MD 01/19/2024 3:47 PM  For on call review [email or web address].            "

## 2024-01-19 NOTE — Assessment & Plan Note (Addendum)
 TSH WNL  Continue Synthroid

## 2024-01-19 NOTE — Assessment & Plan Note (Deleted)
 In the setting of suspected acute UTI (urinary tract infection) superimposed on Chronic Dementia: Admitted to MedSurg/inpatient. CT Head done and showed no acute intracranial abnormality. MRI Brain w/wo Contrast pending. UA showed hazy appearance with greater than 500 glucose, trace leukocytes, negative nitrites, many bacteria, 6-10 RBCs per high-power field and 0-5 WBCs.  Blood cultures x 2 pending.  Urine culture showed multiple species so we will recollect and CT showed bladder wall thickening likely due to incomplete distention or cystitis.  Continue IV Ceftriaxone  2 grams q24h. Follow-up on blood and urine culture and sensitivity. Delirium Precautions. Getting IVF w/ 1/2 NS but will reduce the rate from 125 mL/hr to 75 mL/hr x 12 hours

## 2024-01-19 NOTE — Assessment & Plan Note (Addendum)
"   Continue rosuvastatin          "

## 2024-01-19 NOTE — Assessment & Plan Note (Signed)
 Acute encephalopathy was initially thought to be due to acute UTI CT head showed no acute intracranial abnormality MRI Brain showed multifocal acute ischemia within the R MCA territory Also a probably benign new 9 mm calvarial lesion Given acute ischemia, neurology consulted Transfer to telemetry Echo ordered PT/OT consulting, recommending home health PT/OT Headache since presentation, treated with APAP and Oxy

## 2024-01-19 NOTE — Evaluation (Signed)
 Physical Therapy Evaluation Patient Details Name: Jennifer Boyer MRN: [id number] DOB: [birth year] Today's Date: 01/19/2024  History of Present Illness  83 yo female presents to therapy following hospital admission on 01/16/2024 due to weakness and intermittent confusion, with dx of acute metabolic encephalopathy in setting of suspected UTI superimposed on dementia. Pt also found to have L1 mass and pathological fx. Pt underwent L1 bone mass biopsy on 12/30. Pt was admitted to hospital on 10/18/2023 due to back pain > 1 wk. Pt was dx with UTI 9/25 and started ABX however pain continued, pt returned to ED. Pt PMH includes but is not limited to: fibromyalgia, DM II, HTN, GERD,  dementia, lumbar herniation, DJD, and R sacral stimulator.  Clinical Impression   Pt admitted with above diagnosis.  Pt currently with functional limitations due to the deficits listed below (see PT Problem List). Pt in bed when PT arrived. Pt agreeable to therapy intervention. Pt reports her back pain is much better, however unable to rate on numeric scale and exhibited difficulty following commands for log roll technique-- no formal back precautions at this time, mod A to complete supine to sit, sitting balance G with B LE support, CGA and increased time for sit to stand  from a variety of surfaces to RW, gait tasks in room with RW, cues and CGA 18 feet x 2, pt limited secondary to fatigue. Pt left seated in recliner and all needs in place. Pt will benefit from acute skilled PT to increase their independence and safety with mobility to allow discharge.         If plan is discharge home, recommend the following: A little help with walking and/or transfers;A little help with bathing/dressing/bathroom;Assistance with cooking/housework;Assist for transportation;Supervision due to cognitive status   Can travel by private vehicle        Equipment Recommendations None recommended by PT  Recommendations for Other Services        Functional Status Assessment Patient has had a recent decline in their functional status and demonstrates the ability to make significant improvements in function in a reasonable and predictable amount of time.     Precautions / Restrictions Precautions Precautions: Fall (no formal back precautions) Restrictions Weight Bearing Restrictions Per Provider Order: No      Mobility  Bed Mobility Overal bed mobility: Needs Assistance Bed Mobility: Supine to Sit     Supine to sit: HOB elevated, Mod assist     General bed mobility comments: HOB slightly elevated PT attempting guide pt though log roll due to Lumbar fx and met    Transfers Overall transfer level: Needs assistance Equipment used: Rolling walker (2 wheels) Transfers: Sit to/from Stand Sit to Stand: Contact guard assist           General transfer comment: pt required increased time and increased time for sit to stand from EOB and BSC over standard commode, pt able with cues demonstrate proper UE placement and RW management    Ambulation/Gait Ambulation/Gait assistance: Contact guard assist Gait Distance (Feet): 18 Feet Assistive device: Rolling walker (2 wheels) Gait Pattern/deviations: Step-to pattern, Trunk flexed Gait velocity: decreased     General Gait Details: short stride length, flexed posture and pt unable to modify with multimodal cues, limited foot clearance and cues for attention, direction, safety and obstacle navigation through door way especially  Stairs            Wheelchair Mobility     Tilt Bed    Modified Rankin (Stroke  Patients Only)       Balance Overall balance assessment: Mild deficits observed, not formally tested (pt denies recent falls, however is a poor historian)                                           Pertinent Vitals/Pain Pain Assessment Pain Assessment: Faces Faces Pain Scale: Hurts a little bit Pain Location: back Pain Descriptors /  Indicators: Discomfort, Dull Pain Intervention(s): Monitored during session, Repositioned    Home Living Family/patient expects to be discharged to:: Other (Comment) ([facility]) Living Arrangements: Other (Comment) ([facility]F) Available Help at Discharge: Family             Home Equipment: Agricultural Consultant (2 wheels);Cane - single point;Shower seat;Grab bars - tub/shower Additional Comments: pt is unclear if she has SPC in ILF however states uses RW all of the time    Prior Function Prior Level of Function : Needs assist             Mobility Comments: pt reports amb with RW ADLs Comments: I[facility] provides meals and pt elects to amb to dining room     Extremity/Trunk Assessment        Lower Extremity Assessment Lower Extremity Assessment: Generalized weakness    Cervical / Trunk Assessment Cervical / Trunk Assessment: Other exceptions (hx of Lumbar fx)  Communication   Communication Communication: No apparent difficulties    Cognition Arousal: Alert Behavior During Therapy: WFL for tasks assessed/performed   PT - Cognitive impairments: History of cognitive impairments                         Following commands: Impaired Following commands impaired: Follows multi-step commands inconsistently (occationally requires repetative verbal cues for attention to task)     Cueing Cueing Techniques: Verbal cues, Gestural cues, Tactile cues, Visual cues     General Comments      Exercises     Assessment/Plan    PT Assessment Patient needs continued PT services  PT Problem List Decreased strength;Decreased activity tolerance;Decreased balance;Decreased mobility;Decreased coordination;Decreased safety awareness;Pain       PT Treatment Interventions DME instruction;Gait training;Functional mobility training;Therapeutic activities;Therapeutic exercise;Balance training;Neuromuscular re-education;Patient/family education    PT Goals (Current goals can be found in the  Care Plan section)  Acute Rehab PT Goals Patient Stated Goal: go back home PT Goal Formulation: With patient Time For Goal Achievement: 02/02/24 Potential to Achieve Goals: Good    Frequency Min 3X/week     Co-evaluation               AM-PAC PT 6 Clicks Mobility  Outcome Measure Help needed turning from your back to your side while in a flat bed without using bedrails?: A Little Help needed moving from lying on your back to sitting on the side of a flat bed without using bedrails?: A Little Help needed moving to and from a bed to a chair (including a wheelchair)?: A Little Help needed standing up from a chair using your arms (e.g., wheelchair or bedside chair)?: A Little Help needed to walk in hospital room?: A Little Help needed climbing 3-5 steps with a railing? : Total 6 Click Score: 16    End of Session Equipment Utilized During Treatment: Gait belt Activity Tolerance: Patient tolerated treatment well;No increased pain Patient left: in chair;with call bell/phone within reach;with chair  alarm set Nurse Communication: Mobility status PT Visit Diagnosis: Unsteadiness on feet (R26.81);Other abnormalities of gait and mobility (R26.89);Muscle weakness (generalized) (M62.81);Difficulty in walking, not elsewhere classified (R26.2);Pain Pain - part of body:  (back)    Time: 8940-8874 PT Time Calculation (min) (ACUTE ONLY): 26 min   Charges:   PT Evaluation $PT Eval Low Complexity: 1 Low PT Treatments $Gait Training: 8-22 mins PT General Charges $$ ACUTE PT VISIT: 1 Visit         [place], PT Acute Rehab   [place] VEAR Drone 01/19/2024, 2:41 PM

## 2024-01-19 NOTE — Assessment & Plan Note (Addendum)
 Body mass index is 37.11 kg/m.SABRA  Weight loss should be encouraged Outpatient PCP/bariatric medicine f/u encouraged Significantly low or high BMI is associated with higher medical risk including morbidity and mortality

## 2024-01-19 NOTE — Assessment & Plan Note (Addendum)
 Stable, normocytic - likely anemia of chronic disease Resume Iron  Polysaccharides 150 mg po daily Normal anemia panel

## 2024-01-19 NOTE — Assessment & Plan Note (Addendum)
 Continue donepezil , memantine  Resume escitalopram 

## 2024-01-19 NOTE — Assessment & Plan Note (Addendum)
 Patient presented with generalized weakness and intermittent confusion She has h/o recurrent UTI and chronic dementia Admitted to MedSurg/inpatient UA showed hazy appearance with greater than 500 glucose, trace leukocytes, negative nitrites, many bacteria, 6-10 RBCs per high-power field and 0-5 WBCs CT showed bladder wall thickening likely due to incomplete distention or cystitis Initial urine culture with multiple species so recollected and repeat culture is pending Blood cultures NTD Continue IV Ceftriaxone  2 grams q24h

## 2024-01-19 NOTE — Plan of Care (Signed)
   Problem: Education: Goal: Knowledge of General Education information will improve Description: Including pain rating scale, medication(s)/side effects and non-pharmacologic comfort measures Outcome: Progressing   Problem: Clinical Measurements: Goal: Will remain free from infection Outcome: Progressing   Problem: Clinical Measurements: Goal: Diagnostic test results will improve Outcome: Progressing

## 2024-01-19 NOTE — Evaluation (Signed)
 Occupational Therapy Evaluation Patient Details Name: Jennifer Boyer MRN: [id number] DOB: 1940/03/05 Today's Date: 01/19/2024   History of Present Illness   83 yr old female who presented to the hospital on 01/16/2024 due to weakness and intermittent confusion; she was found to have acute metabolic encephalopathy in setting of suspected UTI superimposed on dementia. Pt also found to have L1 mass and pathological fracture. Pt underwent L1 bone mass biopsy on 12/30. Pt was admitted to hospital on 10/18/2023 due to back pain > 1 wk. Pt was diagnosed with UTI 9/25 and started on antibiotics, however pain continued so pt returned to ED. Pt PMH includes but is not limited to: fibromyalgia, DM II, HTN, GERD,  dementia, lumbar herniation, DJD, and R sacral stimulator.     Clinical Impressions The pt is currently presenting below her baseline level of functioning for ADL management. She is limited by the below listed deficits (see OT problem list). During the session today, she required CGA to min assist to stand using a RW and to perform toileting at bedside commode level; she required mod assist for lower body dressing, as her ability to perform was compromised due to moderate low back pain. She will benefit from OT services to maximize her independence with ADLs and to decrease the risk for restricted participation in meaningful activities. Home health OT is recommended.      If plan is discharge home, recommend the following:   A little help with walking and/or transfers;A little help with bathing/dressing/bathroom;Direct supervision/assist for financial management;Assistance with cooking/housework;Assist for transportation;Direct supervision/assist for medications management;Supervision due to cognitive status     Functional Status Assessment   Patient has had a recent decline in their functional status and demonstrates the ability to make significant improvements in function in a reasonable and  predictable amount of time.     Equipment Recommendations   None recommended by OT     Recommendations for Other Services         Precautions/Restrictions   Restrictions Weight Bearing Restrictions Per Provider Order: No     Mobility Bed Mobility Overal bed mobility: Needs Assistance Bed Mobility: Supine to Sit, Sit to Supine     Supine to sit: Min assist, Used rails Sit to supine: Contact guard assist        Transfers Overall transfer level: Needs assistance Equipment used: Rolling walker (2 wheels) Transfers: Sit to/from Stand, Bed to chair/wheelchair/BSC Sit to Stand: Contact guard assist, From elevated surface     Step pivot transfers: Min assist, Contact guard assist            Balance Overall balance assessment: Mild deficits observed, not formally tested           ADL either performed or assessed with clinical judgement   ADL Overall ADL's : Needs assistance/impaired Eating/Feeding: Independent;Sitting   Grooming: Set up;Sitting;Supervision/safety   Upper Body Bathing: Set up;Supervision/ safety;Sitting   Lower Body Bathing: Minimal assistance;Sitting/lateral leans   Upper Body Dressing : Set up;Supervision/safety;Sitting   Lower Body Dressing: Moderate assistance;Sitting/lateral leans Lower Body Dressing Details (indicate cue type and reason): ability to perform lower body dressing/sock management seated EOB, limited by pt reporting low back pain Toilet Transfer: Minimal assistance;Contact guard assist;BSC/3in1;Rolling walker (2 wheels);Stand-pivot;Cueing for sequencing   Toileting- Clothing Manipulation and Hygiene: Minimal assistance;Sit to/from stand Toileting - Clothing Manipulation Details (indicate cue type and reason): Toileting performed at bedside commode level             Vision Baseline Vision/History:  1 Wears glasses              Pertinent Vitals/Pain Pain Assessment Pain Assessment: 0-10 Pain Score: 4  Pain  Location: back Pain Intervention(s): Limited activity within patient's tolerance, Monitored during session, Repositioned     Extremity/Trunk Assessment Upper Extremity Assessment Upper Extremity Assessment: Right hand dominant;LUE deficits/detail;RUE deficits/detail;Generalized weakness RUE Deficits / Details: AROM WFL. Functional grip strength LUE Deficits / Details: AROM WFL. Functional grip strength   Lower Extremity Assessment Lower Extremity Assessment: Generalized weakness;RLE deficits/detail;LLE deficits/detail RLE Deficits / Details: AROM WFL LLE Deficits / Details: AROM WFL     Communication Communication Communication: No apparent difficulties   Cognition Arousal: Alert Behavior During Therapy: WFL for tasks assessed/performed Cognition: History of cognitive impairments             OT - Cognition Comments: She has a history of dementia. Impaired memory. Able to follow 1 step commands, though had difficulty with multi-step commands                 Following commands: Impaired Following commands impaired: Follows multi-step commands inconsistently     Cueing  General Comments   Cueing Techniques: Verbal cues;Gestural cues;Tactile cues              Home Living Family/patient expects to be discharged to:: Other (Comment) ([facility], [facility]) Living Arrangements: Other (Comment) (ILF) Available Help at Discharge: Family            Home Equipment: Rollator (4 wheels);Cane - single point;Shower seat   Additional Comments: pt is unclear if she has SPC in ILF however states uses RW all of the time      Prior Functioning/Environment Prior Level of Function : Independent/Modified Independent;Needs assist             Mobility Comments:  (She reported being independent with ambulation.) ADLs Comments:  (She reported being independent with ADLs.)    OT Problem List: Decreased strength;Impaired balance (sitting and/or standing);Decreased  cognition;Decreased knowledge of use of DME or AE;Decreased knowledge of precautions;Pain   OT Treatment/Interventions: Self-care/ADL training;Therapeutic exercise;Energy conservation;DME and/or AE instruction;Therapeutic activities;Balance training;Patient/family education;Cognitive remediation/compensation      OT Goals(Current goals can be found in the care plan section)   Acute Rehab OT Goals OT Goal Formulation: With patient Time For Goal Achievement: 02/02/24 Potential to Achieve Goals: Good ADL Goals Pt Will Perform Grooming: with set-up;standing Pt Will Perform Lower Body Dressing: sitting/lateral leans;sit to/from stand;with set-up;with supervision;with adaptive equipment Pt Will Transfer to Toilet: with supervision;ambulating Pt Will Perform Toileting - Clothing Manipulation and hygiene: sit to/from stand;with supervision   OT Frequency:  Min 2X/week       AM-PAC OT 6 Clicks Daily Activity     Outcome Measure Help from another person eating meals?: None Help from another person taking care of personal grooming?: A Little Help from another person toileting, which includes using toliet, bedpan, or urinal?: A Little Help from another person bathing (including washing, rinsing, drying)?: A Little Help from another person to put on and taking off regular upper body clothing?: A Little Help from another person to put on and taking off regular lower body clothing?: A Lot 6 Click Score: 18   End of Session Equipment Utilized During Treatment: Gait belt;Rolling walker (2 wheels) Nurse Communication: Mobility status  Activity Tolerance: Other (comment) (Fair+ tolerance) Patient left: in bed;with call bell/phone within reach;with bed alarm set  OT Visit Diagnosis: Unsteadiness on feet (R26.81);Other abnormalities of gait and mobility (R26.89);Muscle  weakness (generalized) (M62.81);Pain Pain - part of body:  (back)                Time: 8384-8365 OT Time Calculation (min): 19  min Charges:  OT General Charges $OT Visit: 1 Visit OT Evaluation $OT Eval Moderate Complexity: 1 Mod    Delanna JINNY Lesches, OTR/L 01/19/2024, 5:33 PM

## 2024-01-20 ENCOUNTER — Inpatient Hospital Stay (HOSPITAL_COMMUNITY)

## 2024-01-20 DIAGNOSIS — Z515 Encounter for palliative care: Secondary | ICD-10-CM

## 2024-01-20 DIAGNOSIS — I6389 Other cerebral infarction: Secondary | ICD-10-CM | POA: Diagnosis not present

## 2024-01-20 DIAGNOSIS — M8448XA Pathological fracture, other site, initial encounter for fracture: Secondary | ICD-10-CM

## 2024-01-20 DIAGNOSIS — Z66 Do not resuscitate: Secondary | ICD-10-CM | POA: Insufficient documentation

## 2024-01-20 DIAGNOSIS — G9341 Metabolic encephalopathy: Secondary | ICD-10-CM | POA: Diagnosis not present

## 2024-01-20 DIAGNOSIS — G9589 Other specified diseases of spinal cord: Secondary | ICD-10-CM

## 2024-01-20 LAB — LIPID PANEL
Cholesterol: 108 mg/dL (ref 0–200)
HDL: 43 mg/dL
LDL Cholesterol: 50 mg/dL (ref 0–99)
Total CHOL/HDL Ratio: 2.5 ratio
Triglycerides: 76 mg/dL
VLDL: 15 mg/dL (ref 0–40)

## 2024-01-20 LAB — HEMOGLOBIN A1C
Hgb A1c MFr Bld: 8.2 % — ABNORMAL HIGH (ref 4.8–5.6)
Mean Plasma Glucose: 188.64 mg/dL

## 2024-01-20 LAB — ECHOCARDIOGRAM COMPLETE
Area-P 1/2: 3.4 cm2
Calc EF: 61.3 %
Height: 66 in
S' Lateral: 3.4 cm
Single Plane A2C EF: 55.8 %
Single Plane A4C EF: 65.3 %
Weight: 3679.04 [oz_av]

## 2024-01-20 LAB — GLUCOSE, CAPILLARY
Glucose-Capillary: 167 mg/dL — ABNORMAL HIGH (ref 70–99)
Glucose-Capillary: 177 mg/dL — ABNORMAL HIGH (ref 70–99)
Glucose-Capillary: 226 mg/dL — ABNORMAL HIGH (ref 70–99)
Glucose-Capillary: 224 mg/dL — ABNORMAL HIGH (ref 70–99)

## 2024-01-20 NOTE — Assessment & Plan Note (Addendum)
"   Continue PPI  "

## 2024-01-20 NOTE — Assessment & Plan Note (Addendum)
 Stable, normocytic - likely anemia of chronic disease Resume Iron  Polysaccharides 150 mg po daily Normal anemia panel

## 2024-01-20 NOTE — Assessment & Plan Note (Addendum)
"   Continue rosuvastatin          "

## 2024-01-20 NOTE — Assessment & Plan Note (Signed)
 I have discussed code status with the family and the patient would not desire resuscitation and would prefer to die a natural death should that situation arise. Patient will need a gold out of facility DNR form at the time of discharge

## 2024-01-20 NOTE — Assessment & Plan Note (Addendum)
 Body mass index is 37.11 kg/m.Jennifer Boyer  Weight loss should be encouraged Outpatient PCP/bariatric medicine f/u encouraged Significantly low or high BMI is associated with higher medical risk including morbidity and mortality

## 2024-01-20 NOTE — Assessment & Plan Note (Addendum)
 Patient presented with generalized weakness and intermittent confusion She has h/o recurrent UTI and chronic dementia Admitted to MedSurg/inpatient UA showed hazy appearance with greater than 500 glucose, trace leukocytes, negative nitrites, many bacteria, 6-10 RBCs per high-power field and 0-5 WBCs CT showed bladder wall thickening likely due to incomplete distention or cystitis Initial urine culture with multiple species so recollected and repeat culture performed and negative Blood cultures NTD Given IV Ceftriaxone  2 grams q24h No further antibiotics appear to be indicated at this time

## 2024-01-20 NOTE — Consult Note (Signed)
 "                                                                                   Consultation Note Date: 01/20/2024   Patient Name: Jennifer Boyer  DOB: Sep 11, 1940  MRN: [id number]  Age / Sex: 84 y.o., female  PCP: Dayna Motto, DO Referring Physician: Barbarann Nest, MD  Reason for Consultation:  goals of care  HPI/Patient Profile: 84 y.o. female  with past medical history of macrocytic anemia, osteoarthritis, dementia, DM2, fibromyalgia, living at [facility] admitted on 01/16/2024 with weakness and intermittent confusion. She was admitted and treatment was started for UTI. She had MRI brain which revealed R MCA territory ischemia. She also had MRI lumbar spine that revealed lumbar pathological fracture and vertebral soft tissue mass concern for malignancy. Biopsy was taken on 12/30.  Palliative medicine consulted for GOC.   Primary Decision Maker NEXT OF KIN- daughter Jennifer Boyer and son Jennifer Boyer  Discussion: Chart reviewed including labs, progress notes, imaging from this and previous encounters.  MRI brain and CT chest reviewed. CT chest with nonspecific lung nodules. No indication of origin of possible malignancy in spine.  Urine culture with no growth to date.  Case discussed with Dr. Barbarann. Pathology for biopsy is pending. Family does not want patient to know about possible cancer diagnosis.  On evaluation- [name] was pleasant, awake and alert. Asked appropriate questions regarding her care for her UTI. She denied back pain.  She has advanced directives- has a living will but notes it is in her home and she doesn't have access to it. If she were unable to make decisions for herself she would want her daughter and her son to be surrogate decision makers.  She enjoys living at [facility] and is hopeful she will be able to return there.     SUMMARY OF RECOMMENDATIONS -Ischemic stroke- minimal deficits- continue current interventions -Possible malignancy-  pathology is pending- will benefit from further discussion after pathology results- per attending MD family may consider declining further treatment if it proves to be malignant- if so then she would be appropriate for hospice referral- will await path results -I attempted to call patient's daughter- was unable to reach her today- will try again tomorrow    Code Status/Advance Care Planning:   Code Status: Limited: Do not attempt resuscitation (DNR) -DNR-LIMITED -Do Not Intubate/DNI     Prognosis:   Unable to determine  Discharge Planning: To Be Determined  Primary Diagnoses: Present on Admission:  Acute metabolic encephalopathy  Class 2 obesity  HLD (hyperlipidemia)  HTN (hypertension)  Hypothyroidism  Normocytic anemia  Hyperglycemia due to type 2 diabetes mellitus (HCC)  Acute UTI (urinary tract infection)  Pathological fracture of lumbar vertebra  Lumbar mass (HCC)  Dementia without behavioral disturbance (HCC)  GERD (gastroesophageal reflux disease)  Acute cerebral ischemia   Review of Systems  Physical Exam  Vital Signs: BP (!) 139/59 (BP Location: Right Arm)   Pulse 70   Temp (!) 97.4 F (36.3 C) (Oral)   Resp 17   Ht 5' 6 (1.676 m)   Wt 104.3 kg   SpO2 98%   BMI 37.11 kg/m  Pain Scale: 0-10 POSS *See Group Information*: 1-Acceptable,Awake and alert Pain Score: 0-No pain   SpO2: SpO2: 98 % O2 Device:SpO2: 98 % O2 Flow Rate: .O2 Flow Rate (L/min): 3 L/min  IO: Intake/output summary:  Intake/Output Summary (Last 24 hours) at 01/20/2024 1627 Last data filed at 01/20/2024 1254 Gross per 24 hour  Intake 120 ml  Output 350 ml  Net -230 ml    LBM:   Baseline Weight: Weight: 104.3 kg Most recent weight: Weight: 104.3 kg       Thank you for this consult. Palliative medicine will continue to follow and assist as needed.  Time Total: 80 minutes Signed by: Cassondra Stain, AGNP-C Palliative Medicine  Time includes:   Preparing to see the patient (e.g.,  review of tests) Obtaining and/or reviewing separately obtained history Performing a medically necessary appropriate examination and/or evaluation Counseling and educating the patient/family/caregiver Ordering medications, tests, or procedures Referring and communicating with other health care professionals (when not reported separately) Documenting clinical information in the electronic or other health record Independently interpreting results (not reported separately) and communicating results to the patient/family/caregiver Care coordination (not reported separately) Clinical documentation   Please contact Palliative Medicine Team phone at [phone] for questions and concerns.  For individual provider: See Amion               "

## 2024-01-20 NOTE — Progress Notes (Signed)
" °  Echocardiogram 2D Echocardiogram has been performed.  Tinnie FORBES Gosling RDCS 01/20/2024, 11:04 AM "

## 2024-01-20 NOTE — Plan of Care (Signed)
" °  Problem: Clinical Measurements: Goal: Ability to maintain clinical measurements within normal limits will improve Outcome: Progressing   Problem: Clinical Measurements: Goal: Will remain free from infection 01/20/2024 1832 by Sharyne Raymondo CROME, RN Outcome: Progressing 01/20/2024 1131 by Sharyne Raymondo CROME, RN Outcome: Progressing   Problem: Clinical Measurements: Goal: Diagnostic test results will improve Outcome: Progressing   "

## 2024-01-20 NOTE — Assessment & Plan Note (Addendum)
 No home meds Current BP 138/64

## 2024-01-20 NOTE — Assessment & Plan Note (Addendum)
 Acute encephalopathy was initially thought to be due to acute UTI CT head showed no acute intracranial abnormality MRI brain showed multifocal acute ischemia within the R MCA territory Also a probably benign new 9 mm calvarial lesion Given acute ischemia, neurology consulted Transferred to telemetry Given ASA and Plavix Per neurology, concern for recanalized thrombus vs. Hypercoagulability from malignancy vs. Cardiac emboli Echo ordered and unremarkable, no apparent embolic source She may need a 30-day monitor at the time of dc if no source is found PT/OT consulting, recommending home health PT/OT (ordered) Headache since presentation, treated with APAP and Oxy; resolved today

## 2024-01-20 NOTE — Assessment & Plan Note (Addendum)
 Continue donepezil , memantine  Resume escitalopram 

## 2024-01-20 NOTE — Assessment & Plan Note (Addendum)
 TSH WNL  Continue Synthroid

## 2024-01-20 NOTE — Assessment & Plan Note (Addendum)
 CT Abd/Pelvis showed progressive, destructive mass involving the L1 vertebra with mild pathologic fracture, now measuring 3.3 x 3.2 cm, concerning for underlying malignancy and favoring osseous metastasis Primary site of disease is unknown MRI of the lumbar spine witht soft tissue lesion of the L1 vertebral body, concerning for malignancy, with 25% anterior height loss CT of the chest ordered to look for primary and other sites of metastasis, showed 2 small nodules in RUL and RLL, nonspecific Consulted IR, underwent fluoroscopic guided right L1 bone mass biopsy on 12/30; pathology is pending   Further workup may involve oncology once pathology results are known She has had back pain and may benefit from radiation therapy for palliation Her daughter reports that family is otherwise unlikely to seek care for malignancy Palliative care consulted at family request

## 2024-01-20 NOTE — Assessment & Plan Note (Addendum)
 A1c 8.2, suboptimal control Hold Glucophage , Jardiance  Continue glargine Cover with moderate-scale SSI Carb modified diet

## 2024-01-20 NOTE — Progress Notes (Signed)
 Mobility Specialist Progress Note:   01/20/24 1104  Mobility  Activity Dangled on edge of bed  Level of Assistance Minimal assist, patient does 75% or more  Activity Response Tolerated poorly  Mobility Referral Yes  Mobility visit 1 Mobility  Mobility Specialist Start Time (ACUTE ONLY) 0930  Mobility Specialist Stop Time (ACUTE ONLY) 0939  Mobility Specialist Time Calculation (min) (ACUTE ONLY) 9 min   Pt was received in bed and agreed to mobility. Min A to edge of bed. Pt stated pain on backside, deferred further mobility. Returned to bed with all needs met. Call bell in reach.  [employer] - Mobility Specialist

## 2024-01-20 NOTE — Progress Notes (Signed)
 " Progress Note   Patient: Jennifer Boyer FMW:[id number] DOB: 01-26-1940 DOA: 01/16/2024     3 DOS: the patient was seen and examined on 01/20/2024   Brief hospital course: 84yo with h/o macrocytic anemia, osteoarthritis, dementia, type 2 diabetes, fibromyalgia, GERD, hypertension, hyperlipidemia, hypothyroidism, and chronic intractable lower back pain who presented from [facility] on 12/28 due to generalized weakness and intermittent confusion.  Admitted for acute metabolic encephalopathy in setting of suspected UTI.  Further workup revealed that she has a lumbar mass with associated pathological fracture that is concerning for underlying malignancy.  MRI with multifocal acute ischemia in the R MCA territory with small associated petechial hemorrhage; also with new enhancing 9mm calvarial lesion that appears to be benign.  Underwent lumbar spine bone biopsy 12/30.      Assessment & Plan Acute UTI (urinary tract infection) Patient presented with generalized weakness and intermittent confusion She has h/o recurrent UTI and chronic dementia Admitted to MedSurg/inpatient UA showed hazy appearance with greater than 500 glucose, trace leukocytes, negative nitrites, many bacteria, 6-10 RBCs per high-power field and 0-5 WBCs CT showed bladder wall thickening likely due to incomplete distention or cystitis Initial urine culture with multiple species so recollected and repeat culture performed and negative Blood cultures NTD Given IV Ceftriaxone  2 grams q24h No further antibiotics appear to be indicated at this time Pathological fracture of lumbar vertebra Lumbar mass (HCC) CT Abd/Pelvis showed progressive, destructive mass involving the L1 vertebra with mild pathologic fracture, now measuring 3.3 x 3.2 cm, concerning for underlying malignancy and favoring osseous metastasis Primary site of disease is unknown MRI of the lumbar spine witht soft tissue lesion of the L1 vertebral body, concerning for  malignancy, with 25% anterior height loss CT of the chest ordered to look for primary and other sites of metastasis, showed 2 small nodules in RUL and RLL, nonspecific Consulted IR, underwent fluoroscopic guided right L1 bone mass biopsy on 12/30; pathology is pending   Further workup may involve oncology once pathology results are known She has had back pain and may benefit from radiation therapy for palliation Her daughter reports that family is otherwise unlikely to seek care for malignancy Palliative care consulted at family request Acute cerebral ischemia Acute encephalopathy was initially thought to be due to acute UTI CT head showed no acute intracranial abnormality MRI brain showed multifocal acute ischemia within the R MCA territory Also a probably benign new 9 mm calvarial lesion Given acute ischemia, neurology consulted Transferred to telemetry Given ASA and Plavix Per neurology, concern for recanalized thrombus vs. Hypercoagulability from malignancy vs. Cardiac emboli Echo ordered and unremarkable, no apparent embolic source She may need a 30-day monitor at the time of dc if no source is found PT/OT consulting, recommending home health PT/OT (ordered) Headache since presentation, treated with APAP and Oxy; resolved today Hypothyroidism TSH WNL Continue Synthroid  HTN (hypertension) No home meds Current BP 138/64 Normocytic anemia Stable, normocytic - likely anemia of chronic disease Resume Iron  Polysaccharides 150 mg po daily Normal anemia panel HLD (hyperlipidemia) Continue rosuvastatin  Hyperglycemia due to type 2 diabetes mellitus (HCC) A1c 8.2, suboptimal control Hold Glucophage , Jardiance  Continue glargine Cover with moderate-scale SSI Carb modified diet  Dementia without behavioral disturbance (HCC) Continue donepezil , memantine  Resume escitalopram  GERD (gastroesophageal reflux disease) Continue PPI Class 2 obesity Body mass index is 37.11 kg/m.SABRA  Weight  loss should be encouraged Outpatient PCP/bariatric medicine f/u encouraged Significantly low or high BMI is associated with higher medical risk including morbidity  and mortality  DNR (do not resuscitate) I have discussed code status with the family and the patient would not desire resuscitation and would prefer to die a natural death should that situation arise. Patient will need a gold out of facility DNR form at the time of discharge       Consultants: Neurology IR Palliative care PT OT   Procedures: Fluoroscopic-guided R L1 bone mass biopsy 12/30 Echocardiogram   Antibiotics: Ceftriaxone  12/29-1/1   30 Day Unplanned Readmission Risk Score    Flowsheet Row ED to Hosp-Admission (Current) from 01/16/2024 in [facility]  30 Day Unplanned Readmission Risk Score (%) 23.36 Filed at 01/20/2024 0400    This score is the patient's risk of an unplanned readmission within 30 days of being discharged (0 -100%). The score is based on dignosis, age, lab data, medications, orders, and past utilization.   Low:  0-14.9   Medium: 15-21.9   High: 22-29.9   Extreme: 30 and above           Subjective: Feeling ok, no complaints.  Her daughter reports that she is actually more jovial and engaging since the CVA than prior.   Objective: Vitals:   01/20/24 0503 01/20/24 1249  BP: (!) 141/73 (!) 139/59  Pulse: 73 70  Resp: 18 17  Temp: 98.3 F (36.8 C) (!) 97.4 F (36.3 C)  SpO2:      Intake/Output Summary (Last 24 hours) at 01/20/2024 1540 Last data filed at 01/20/2024 1254 Gross per 24 hour  Intake 120 ml  Output 350 ml  Net -230 ml   Filed Weights   01/17/24 0041  Weight: 104.3 kg    Exam:  General:  Appears calm and comfortable and is in NAD Eyes:  normal lids, iris ENT:  grossly normal hearing, lips & tongue, mmm Cardiovascular:  RRR. No LE edema.  Respiratory:   CTA bilaterally with no wheezes/rales/rhonchi.  Normal respiratory  effort. Abdomen:  soft, NT, ND Skin:  no rash or induration seen on limited exam Musculoskeletal:  grossly normal tone BUE/BLE, good ROM, no bony abnormality Psychiatric:  grossly normal mood and affect, speech fluent and appropriate Neurologic:  CN 2-12 grossly intact, moves all extremities in coordinated fashion  Data Reviewed: I have reviewed the patient's lab results since admission.  Pertinent labs for today include:   Lipids: 108/43/50/76 Normal TSH A1c 8.2   Family Communication: Daughter was present  Mobility: PT/OT Consulted and are recommending - Home Health Pt12/31/2025 1400    Code Status: Limited: Do not attempt resuscitation (DNR) -DNR-LIMITED -Do Not Intubate/DNI     Disposition: Status is: Inpatient Remains inpatient appropriate because: ongoing management     Time spent: 50 minutes  Unresulted Labs (From admission, onward)     Start     Ordered   01/21/24 0500  CBC  Tomorrow morning,   R       Question:  Specimen collection method  Answer:  Lab=Lab collect   01/20/24 1540   01/21/24 0500  Basic metabolic panel with GFR  Tomorrow morning,   R       Question:  Specimen collection method  Answer:  Lab=Lab collect   01/20/24 1540             Author: Delon Herald, MD 01/20/2024 3:40 PM  For on call review [email or web address].            "

## 2024-01-21 ENCOUNTER — Other Ambulatory Visit (HOSPITAL_COMMUNITY): Payer: Self-pay

## 2024-01-21 DIAGNOSIS — G9341 Metabolic encephalopathy: Secondary | ICD-10-CM | POA: Diagnosis not present

## 2024-01-21 DIAGNOSIS — N390 Urinary tract infection, site not specified: Principal | ICD-10-CM

## 2024-01-21 LAB — CBC
HCT: 32.4 % — ABNORMAL LOW (ref 36.0–46.0)
Hemoglobin: 10.6 g/dL — ABNORMAL LOW (ref 12.0–15.0)
MCH: 32.7 pg (ref 26.0–34.0)
MCHC: 32.7 g/dL (ref 30.0–36.0)
MCV: 100 fL (ref 80.0–100.0)
Platelets: 224 K/uL (ref 150–400)
RBC: 3.24 MIL/uL — ABNORMAL LOW (ref 3.87–5.11)
RDW: 14.6 % (ref 11.5–15.5)
WBC: 5.3 K/uL (ref 4.0–10.5)
nRBC: 0 % (ref 0.0–0.2)

## 2024-01-21 LAB — BASIC METABOLIC PANEL WITH GFR
Anion gap: 9 (ref 5–15)
BUN: 11 mg/dL (ref 8–23)
CO2: 24 mmol/L (ref 22–32)
Calcium: 9.8 mg/dL (ref 8.9–10.3)
Chloride: 102 mmol/L (ref 98–111)
Creatinine, Ser: 0.7 mg/dL (ref 0.44–1.00)
GFR, Estimated: >60 mL/min
Glucose, Bld: 183 mg/dL — ABNORMAL HIGH (ref 70–99)
Potassium: 4.1 mmol/L (ref 3.5–5.1)
Sodium: 135 mmol/L (ref 135–145)

## 2024-01-21 LAB — GLUCOSE, CAPILLARY
Glucose-Capillary: 199 mg/dL — ABNORMAL HIGH (ref 70–99)
Glucose-Capillary: 210 mg/dL — ABNORMAL HIGH (ref 70–99)
Glucose-Capillary: 248 mg/dL — ABNORMAL HIGH (ref 70–99)

## 2024-01-21 MED ORDER — CLOPIDOGREL BISULFATE 75 MG PO TABS
75.0000 mg | ORAL_TABLET | Freq: Every day | ORAL | 0 refills | Status: AC
  Filled 2024-01-21: qty 21, 21d supply, fill #0

## 2024-01-21 MED ORDER — ASPIRIN 81 MG PO TBEC
81.0000 mg | DELAYED_RELEASE_TABLET | Freq: Every day | ORAL | 12 refills | Status: AC
  Filled 2024-01-21: qty 30, 30d supply, fill #0

## 2024-01-21 MED ORDER — OXYCODONE HCL 5 MG PO TABS
5.0000 mg | ORAL_TABLET | ORAL | 0 refills | Status: AC | PRN
  Filled 2024-01-21: qty 15, 3d supply, fill #0

## 2024-01-21 NOTE — Inpatient Diabetes Management (Signed)
 Inpatient Diabetes Program Recommendations  AACE/ADA: New Consensus Statement on Inpatient Glycemic Control (2015)  Target Ranges:  Prepandial:   less than 140 mg/dL      Peak postprandial:   less than 180 mg/dL (1-2 hours)      Critically ill patients:  140 - 180 mg/dL   Lab Results  Component Value Date   GLUCAP 199 (H) 01/21/2024   HGBA1C 8.2 (H) 01/20/2024    Review of Glycemic Control  Latest Reference Range & Units 01/20/24 07:36 01/20/24 11:31 01/20/24 16:12 01/20/24 20:56 01/21/24 07:57  Glucose-Capillary 70 - 99 mg/dL 832 (H) 822 (H) 773 (H) 224 (H) 199 (H)   Diabetes history: DM 2 Outpatient Diabetes medications: Admelog 4-8 units bid before meals sliding scale, Basaglar  20 units, Jardiance  25 mg Daily, Metformin  1000 mg Daily, FSL3+ Current orders for Inpatient glycemic control:  Lantus  20 units Novolog  0-15 units tid + hs Novolog  4 units tid meal coverage  Inpatient Diabetes Program Recommendations:    If in the plan of care consider: -   Increase Novolog  meal coverage to 6 units tid if eating >50% of meals  Thanks,  Clotilda Bull RN, MSN, BC-ADM Inpatient Diabetes Coordinator Team Pager [phone] (8a-5p)

## 2024-01-21 NOTE — Assessment & Plan Note (Addendum)
 CT Abd/Pelvis showed progressive, destructive mass involving the L1 vertebra with mild pathologic fracture, now measuring 3.3 x 3.2 cm, concerning for underlying malignancy and favoring osseous metastasis Primary site of disease is unknown MRI of the lumbar spine witht soft tissue lesion of the L1 vertebral body, concerning for malignancy, with 25% anterior height loss CT of the chest ordered to look for primary and other sites of metastasis, showed 2 small nodules in RUL and RLL, nonspecific Consulted IR, underwent fluoroscopic guided right L1 bone mass biopsy on 12/30; pathology is pending   Further workup may involve oncology once pathology results are known She has had back pain and may benefit from radiation therapy for palliation Her daughter reports that family is otherwise unlikely to seek care for malignancy Palliative care consulted at family request Pathology result is still pending but her daughter prefers to dc at this time and f/u with PCP to discuss further options based on results

## 2024-01-21 NOTE — Assessment & Plan Note (Addendum)
"   Continue PPI  "

## 2024-01-21 NOTE — Assessment & Plan Note (Addendum)
 No home meds Current BP 138/64

## 2024-01-21 NOTE — Progress Notes (Signed)
 Discharge meds in a secure bag delivered to patient by this RN

## 2024-01-21 NOTE — Plan of Care (Signed)
" °  Problem: Education: Goal: Knowledge of General Education information will improve Description: Including pain rating scale, medication(s)/side effects and non-pharmacologic comfort measures 01/21/2024 1847 by Claudene Graylin ORN, RN Outcome: Adequate for Discharge 01/21/2024 1726 by Claudene Graylin ORN, RN Outcome: Adequate for Discharge   Problem: Health Behavior/Discharge Planning: Goal: Ability to manage health-related needs will improve 01/21/2024 1847 by Claudene Graylin ORN, RN Outcome: Adequate for Discharge 01/21/2024 1726 by Claudene Graylin ORN, RN Outcome: Adequate for Discharge   Problem: Clinical Measurements: Goal: Ability to maintain clinical measurements within normal limits will improve 01/21/2024 1847 by Claudene Graylin ORN, RN Outcome: Adequate for Discharge 01/21/2024 1726 by Claudene Graylin ORN, RN Outcome: Adequate for Discharge Goal: Will remain free from infection 01/21/2024 1847 by Claudene Graylin ORN, RN Outcome: Adequate for Discharge 01/21/2024 1726 by Claudene Graylin ORN, RN Outcome: Adequate for Discharge Goal: Diagnostic test results will improve 01/21/2024 1847 by Claudene Graylin ORN, RN Outcome: Adequate for Discharge 01/21/2024 1726 by Claudene Graylin ORN, RN Outcome: Adequate for Discharge Goal: Respiratory complications will improve 01/21/2024 1847 by Claudene Graylin ORN, RN Outcome: Adequate for Discharge 01/21/2024 1726 by Claudene Graylin ORN, RN Outcome: Adequate for Discharge Goal: Cardiovascular complication will be avoided 01/21/2024 1847 by Claudene Graylin ORN, RN Outcome: Adequate for Discharge 01/21/2024 1726 by Claudene Graylin ORN, RN Outcome: Adequate for Discharge   Problem: Activity: Goal: Risk for activity intolerance will decrease 01/21/2024 1847 by Claudene Graylin ORN, RN Outcome: Adequate for Discharge 01/21/2024 1726 by Claudene Graylin ORN, RN Outcome: Adequate for Discharge   Problem: Nutrition: Goal: Adequate nutrition will be maintained 01/21/2024 1847 by Claudene Graylin ORN, RN Outcome: Adequate for  Discharge 01/21/2024 1726 by Claudene Graylin ORN, RN Outcome: Adequate for Discharge   Problem: Coping: Goal: Level of anxiety will decrease 01/21/2024 1847 by Claudene Graylin ORN, RN Outcome: Adequate for Discharge 01/21/2024 1726 by Claudene Graylin ORN, RN Outcome: Adequate for Discharge   Problem: Elimination: Goal: Will not experience complications related to bowel motility 01/21/2024 1847 by Claudene Graylin ORN, RN Outcome: Adequate for Discharge 01/21/2024 1726 by Claudene Graylin ORN, RN Outcome: Adequate for Discharge Goal: Will not experience complications related to urinary retention 01/21/2024 1847 by Claudene Graylin ORN, RN Outcome: Adequate for Discharge 01/21/2024 1726 by Claudene Graylin ORN, RN Outcome: Adequate for Discharge   Problem: Pain Managment: Goal: General experience of comfort will improve and/or be controlled 01/21/2024 1847 by Claudene Graylin ORN, RN Outcome: Adequate for Discharge 01/21/2024 1726 by Claudene Graylin ORN, RN Outcome: Adequate for Discharge   "

## 2024-01-21 NOTE — Assessment & Plan Note (Addendum)
 Stable, normocytic - likely anemia of chronic disease Resume Iron  Polysaccharides 150 mg po daily Normal anemia panel

## 2024-01-21 NOTE — Discharge Instructions (Signed)
 Contact Palliative Care Agency of Choice [email or web address]

## 2024-01-21 NOTE — Assessment & Plan Note (Addendum)
 I have discussed code status with the family and the patient would not desire resuscitation and would prefer to die a natural death should that situation arise. Patient will need a gold out of facility DNR form at the time of discharge

## 2024-01-21 NOTE — Assessment & Plan Note (Addendum)
 A1c 8.2, suboptimal control Resume Glucophage , Jardiance  Continue glargine and home SSI Carb modified diet

## 2024-01-21 NOTE — Assessment & Plan Note (Addendum)
 Continue donepezil , escitalopram  Cognitive impairment evaluated and confirmed on SLP exam today

## 2024-01-21 NOTE — Assessment & Plan Note (Addendum)
 Acute encephalopathy was initially thought to be due to acute UTI CT head showed no acute intracranial abnormality MRI brain showed multifocal acute ischemia within the R MCA territory Also a probably benign new 9 mm calvarial lesion Given acute ischemia, neurology consulted Transferred to telemetry Given ASA and Plavix Per neurology, concern for recanalized thrombus vs. Hypercoagulability from malignancy vs. Cardiac emboli Echo ordered and unremarkable, no apparent embolic source She may need a 30-day monitor at the time of dc if no source is found - cardiology to arrange PT/OT consulting, recommending home health PT/OT (ordered) Headache since presentation, treated with APAP and Oxy; mostly resolved

## 2024-01-21 NOTE — Discharge Summary (Addendum)
 " Physician Discharge Summary   Patient: Jennifer Boyer MRN: [id number] DOB: Jun 03, 1940  Admit date:     01/16/2024  Discharge date: 01/21/2024  Discharge Physician: Delon Herald   PCP: Dayna Motto, DO   Recommendations at discharge:   You are being discharged with home health physical and occupational therapy as well as speech therapy Pathology result is still pending and will need outpatient follow up Continue aspirin plus Plavix for 3 weeks and then transition to aspirin alone Cardiology will arrange for an outpatient long-term heart monitor Outpatient palliative care is ordered You are being prescribed a limited number of narcotic pain pills; take as directed and do not drive or make important decisions while taking this medication  Follow up with Dr. Dayna next week  Discharge Diagnoses: Principal Problem:   Acute metabolic encephalopathy Active Problems:   Hypothyroidism   HTN (hypertension)   Normocytic anemia   HLD (hyperlipidemia)   Hyperglycemia due to type 2 diabetes mellitus (HCC)   Class 2 obesity   Acute UTI (urinary tract infection)   Pathological fracture of lumbar vertebra   Lumbar mass (HCC)   Dementia without behavioral disturbance (HCC)   GERD (gastroesophageal reflux disease)   Acute cerebral ischemia   DNR (do not resuscitate)    Hospital Course: 83yo with h/o macrocytic anemia, osteoarthritis, dementia, type 2 diabetes, fibromyalgia, GERD, hypertension, hyperlipidemia, hypothyroidism, and chronic intractable lower back pain who presented from [facility] on 12/28 due to generalized weakness and intermittent confusion.  Admitted for acute metabolic encephalopathy in setting of suspected UTI.  Further workup revealed that she has a lumbar mass with associated pathological fracture that is concerning for underlying malignancy.  MRI with multifocal acute ischemia in the R MCA territory with small associated petechial hemorrhage; also with new enhancing  9mm calvarial lesion that appears to be benign.  Underwent lumbar spine bone biopsy 12/30.    Assessment and Plan:  Assessment & Plan Acute UTI (urinary tract infection) Patient presented with generalized weakness and intermittent confusion She has h/o recurrent UTI and chronic dementia Admitted to MedSurg/inpatient UA showed hazy appearance with greater than 500 glucose, trace leukocytes, negative nitrites, many bacteria, 6-10 RBCs per high-power field and 0-5 WBCs CT showed bladder wall thickening likely due to incomplete distention or cystitis Initial urine culture with multiple species so recollected and repeat culture performed and negative Blood cultures NTD Given IV Ceftriaxone   No further antibiotics appear to be indicated at this time Pathological fracture of lumbar vertebra Lumbar mass (HCC) CT Abd/Pelvis showed progressive, destructive mass involving the L1 vertebra with mild pathologic fracture, now measuring 3.3 x 3.2 cm, concerning for underlying malignancy and favoring osseous metastasis Primary site of disease is unknown MRI of the lumbar spine witht soft tissue lesion of the L1 vertebral body, concerning for malignancy, with 25% anterior height loss CT of the chest ordered to look for primary and other sites of metastasis, showed 2 small nodules in RUL and RLL, nonspecific Consulted IR, underwent fluoroscopic guided right L1 bone mass biopsy on 12/30; pathology is pending   Further workup may involve oncology once pathology results are known She has had back pain and may benefit from radiation therapy for palliation Her daughter reports that family is otherwise unlikely to seek care for malignancy Palliative care consulted at family request Pathology result is still pending but her daughter prefers to dc at this time and f/u with PCP to discuss further options based on results Acute cerebral ischemia Acute encephalopathy  was initially thought to be due to acute UTI CT  head showed no acute intracranial abnormality MRI brain showed multifocal acute ischemia within the R MCA territory Also a probably benign new 9 mm calvarial lesion Given acute ischemia, neurology consulted Transferred to telemetry Given ASA and Plavix Per neurology, concern for recanalized thrombus vs. Hypercoagulability from malignancy vs. Cardiac emboli Echo ordered and unremarkable, no apparent embolic source She may need a 30-day monitor at the time of dc if no source is found - cardiology to arrange PT/OT consulting, recommending home health PT/OT (ordered) Headache since presentation, treated with APAP and Oxy; mostly resolved Hypothyroidism TSH WNL Continue Synthroid  HTN (hypertension) No home meds Current BP 138/64 Normocytic anemia Stable, normocytic - likely anemia of chronic disease Resume Iron  Polysaccharides 150 mg po daily Normal anemia panel HLD (hyperlipidemia) Continue rosuvastatin  Hyperglycemia due to type 2 diabetes mellitus (HCC) A1c 8.2, suboptimal control Resume Glucophage , Jardiance  Continue glargine and home SSI Carb modified diet  Dementia without behavioral disturbance (HCC) Continue donepezil , escitalopram  Cognitive impairment evaluated and confirmed on SLP exam today GERD (gastroesophageal reflux disease) Continue PPI Class 2 obesity Body mass index is 37.11 kg/m.SABRA  Weight loss should be encouraged Outpatient PCP/bariatric medicine f/u encouraged Significantly low or high BMI is associated with higher medical risk including morbidity and mortality  DNR (do not resuscitate) I have discussed code status with the family and the patient would not desire resuscitation and would prefer to die a natural death should that situation arise. Patient will need a gold out of facility DNR form at the time of discharge       Consultants: Neurology IR Palliative care PT OT SLP   Procedures: Fluoroscopic-guided R L1 bone mass biopsy  12/30 Echocardiogram   Antibiotics: Ceftriaxone  12/29-1/1   Pain control - [place]  Controlled Substance Reporting System database was reviewed. and patient was instructed, not to drive, operate heavy machinery, perform activities at heights, swimming or participation in water  activities or provide baby-sitting services while on Pain, Sleep and Anxiety Medications; until their outpatient Physician has advised to do so again. Also recommended to not to take more than prescribed Pain, Sleep and Anxiety Medications.   Disposition: Home health Diet recommendation:  Cardiac and Carb modified diet DISCHARGE MEDICATION: Allergies as of 01/21/2024   No Known Allergies      Medication List     STOP taking these medications    insulin  aspart 100 UNIT/ML FlexPen Commonly known as: NOVOLOG    memantine  10 MG tablet Commonly known as: NAMENDA    oxyCODONE -acetaminophen  5-325 MG tablet Commonly known as: PERCOCET/ROXICET   phenazopyridine  200 MG tablet Commonly known as: PYRIDIUM    polyethylene glycol 17 g packet Commonly known as: MIRALAX  / GLYCOLAX        TAKE these medications    acetaminophen  500 MG tablet Commonly known as: TYLENOL  Take 500-1,000 mg by mouth every 6 (six) hours as needed for mild pain (pain score 1-3).   Admelog SoloStar 100 UNIT/ML KwikPen Generic drug: insulin  lispro Inject 4-8 Units into the skin See admin instructions. Inject 4-8 units into the skin 2 times a day before a meal, PER SLIDING SCALE: BGL 140-199 = 4 units;  200-250 = 6 units, 251-299 = 8 units; 300 or greater = CALL MD   ascorbic acid  500 MG tablet Commonly known as: VITAMIN C  Take 1 tablet (500 mg total) by mouth daily.   aspirin EC 81 MG tablet Take 1 tablet (81 mg total) by mouth daily. Swallow whole. Start  taking on: January 22, 2024   Basaglar  KwikPen 100 UNIT/ML Inject 55 Units into the skin daily. What changed:  how much to take when to take this   CINNAMON PO Take 1-2  capsules by mouth daily.   clopidogrel 75 MG tablet Commonly known as: PLAVIX Take 1 tablet (75 mg total) by mouth daily for 21 days. Start taking on: January 22, 2024   donepezil  10 MG tablet Commonly known as: ARICEPT  Take 1 tablet (10 mg total) by mouth at bedtime.   escitalopram  10 MG tablet Commonly known as: LEXAPRO  Take 10 mg by mouth daily.   FreeStyle Libre 3 Plus Sensor Misc Inject 1 patch into the skin See admin instructions. Place 1 new sensor into the skin every 15 days   iron  polysaccharides 150 MG capsule Commonly known as: NIFEREX Take 1 capsule (150 mg total) by mouth daily.   Jardiance  25 MG Tabs tablet Generic drug: empagliflozin  Take 25 mg by mouth daily.   levothyroxine  75 MCG tablet Commonly known as: SYNTHROID  Take 75 mcg by mouth daily before breakfast.   lidocaine  5 % Commonly known as: LIDODERM  Place 1 patch onto the skin daily. Remove & Discard patch within 12 hours or as directed by MDApply to back Apply to intact skin to cover the most painful area. Apply the prescribed number of patches (maximum of 3), for up to 12 hours within a 24 hour period. What changed:  when to take this reasons to take this   metFORMIN  1000 MG tablet Commonly known as: GLUCOPHAGE  Take 1,000 mg by mouth daily with breakfast.   methocarbamol  750 MG tablet Commonly known as: ROBAXIN  Take 750 mg by mouth at bedtime.   ondansetron  4 MG tablet Commonly known as: ZOFRAN  Take 1 tablet (4 mg total) by mouth every 6 (six) hours as needed for nausea.   One-A-Day Proactive 65+ Tabs Take 1 tablet by mouth daily with breakfast.   oxyCODONE  5 MG immediate release tablet Commonly known as: Oxy IR/ROXICODONE  Take 1 tablet (5 mg total) by mouth every 4 (four) hours as needed for moderate pain (pain score 4-6).   pantoprazole  40 MG tablet Commonly known as: PROTONIX  Take 40 mg by mouth daily before breakfast.   rosuvastatin  20 MG tablet Commonly known as: CRESTOR  Take  20 mg by mouth at bedtime.   VITAMIN B12 PO Take 1 tablet by mouth daily after breakfast.   Vitamin D3 1000 units Caps Take 1,000 Units by mouth daily after breakfast.        Discharge Exam:   Subjective: Feeling pretty good.  Feels ready to go home today.  Confused about remaining test (biopsy).   Objective: Vitals:   01/21/24 0535 01/21/24 1338  BP: (!) 151/87 (!) 141/72  Pulse: 73 74  Resp: 14 15  Temp: 98 F (36.7 C) 98.4 F (36.9 C)  SpO2: 94% 99%    Intake/Output Summary (Last 24 hours) at 01/21/2024 1740 Last data filed at 01/21/2024 1106 Gross per 24 hour  Intake 240 ml  Output 700 ml  Net -460 ml   Filed Weights   01/17/24 0041  Weight: 104.3 kg    Exam:  General:  Appears calm and comfortable and is in NAD Eyes:  normal lids, iris ENT:  grossly normal hearing, lips & tongue, mmm Cardiovascular:  RRR, no m/r/g. No LE edema.  Respiratory:   CTA bilaterally with no wheezes/rales/rhonchi.  Normal respiratory effort. Abdomen:  soft, NT, ND Skin:  no rash or induration seen  on limited exam Musculoskeletal:  grossly normal tone BUE/BLE, good ROM, no bony abnormality Psychiatric:  grossly normal mood and affect, speech fluent and appropriate, AOx3 Neurologic:  CN 2-12 grossly intact, moves all extremities in coordinated fashion  Data Reviewed: I have reviewed the patient's lab results since admission.  Pertinent labs for today include:   Glucose 183 WBC 5.3 Hgb 10.6    Condition at discharge: improving  The results of significant diagnostics from this hospitalization (including imaging, microbiology, ancillary and laboratory) are listed below for reference.   Imaging Studies: ECHOCARDIOGRAM COMPLETE Result Date: 01/20/2024    ECHOCARDIOGRAM REPORT   Patient Name:   KAYLLA COBOS Date of Exam: 01/20/2024 Medical Rec #:  [id number]       Height:       66.0 in Accession #:    [id number]      Weight:       229.9 lb Date of Birth:  Aug 26, 1940       BSA:           2.122 m Patient Age:    83 years        BP:           141/73 mmHg Patient Gender: F               HR:           69 bpm. Exam Location:  Inpatient Procedure: 2D Echo, Cardiac Doppler and Color Doppler (Both Spectral and Color            Flow Doppler were utilized during procedure). Indications:    Stroke I63.9  History:        Patient has no prior history of Echocardiogram examinations.                 Risk Factors:Dyslipidemia, Diabetes and Hypertension.  Sonographer:    Tinnie Gosling RDCS Referring Phys: [id number] MCNEILL P KIRKPATRICK IMPRESSIONS  1. Left ventricular ejection fraction, by estimation, is 60 to 65%. Left ventricular ejection fraction by 2D MOD biplane is 61.3 %. The left ventricle has normal function. The left ventricle has no regional wall motion abnormalities. Left ventricular diastolic parameters are indeterminate.  2. Right ventricular systolic function is normal. The right ventricular size is normal. Tricuspid regurgitation signal is inadequate for assessing PA pressure.  3. The mitral valve is normal in structure. Trivial mitral valve regurgitation. No evidence of mitral stenosis.  4. The aortic valve was not well visualized. Aortic valve regurgitation is not visualized. No aortic stenosis is present.  5. The inferior vena cava is normal in size with greater than 50% respiratory variability, suggesting right atrial pressure of 3 mmHg. Comparison(s): No prior Echocardiogram. Conclusion(s)/Recommendation(s): No intracardiac source of embolism detected on this transthoracic study. Consider a transesophageal echocardiogram to exclude cardiac source of embolism if clinically indicated. FINDINGS  Left Ventricle: Left ventricular ejection fraction, by estimation, is 60 to 65%. Left ventricular ejection fraction by 2D MOD biplane is 61.3 %. The left ventricle has normal function. The left ventricle has no regional wall motion abnormalities. The left ventricular internal cavity size was normal in  size. There is no left ventricular hypertrophy. Left ventricular diastolic parameters are indeterminate. Right Ventricle: The right ventricular size is normal. No increase in right ventricular wall thickness. Right ventricular systolic function is normal. Tricuspid regurgitation signal is inadequate for assessing PA pressure. Left Atrium: Left atrial size was normal in size. Right Atrium: Right atrial size was normal in size. Pericardium: Trivial  pericardial effusion is present. Mitral Valve: The mitral valve is normal in structure. Mild mitral annular calcification. Trivial mitral valve regurgitation. No evidence of mitral valve stenosis. Tricuspid Valve: The tricuspid valve is normal in structure. Tricuspid valve regurgitation is not demonstrated. No evidence of tricuspid stenosis. Aortic Valve: The aortic valve was not well visualized. Aortic valve regurgitation is not visualized. No aortic stenosis is present. Pulmonic Valve: The pulmonic valve was not well visualized. Pulmonic valve regurgitation is not visualized. No evidence of pulmonic stenosis. Aorta: The aortic root is normal in size and structure and the ascending aorta was not well visualized. Venous: The inferior vena cava is normal in size with greater than 50% respiratory variability, suggesting right atrial pressure of 3 mmHg. IAS/Shunts: No atrial level shunt detected by color flow Doppler.  LEFT VENTRICLE PLAX 2D                        Biplane EF (MOD) LVIDd:         4.20 cm         LV Biplane EF:   Left LVIDs:         3.40 cm                          ventricular LV PW:         0.90 cm                          ejection LV IVS:        0.90 cm                          fraction by LVOT diam:     1.70 cm                          2D MOD LV SV:         40                               biplane is LV SV Index:   19                               61.3 %. LVOT Area:     2.27 cm LV IVRT:       95 msec         Diastology                                LV e'  medial:    6.42 cm/s                                LV E/e' medial:  19.0 LV Volumes (MOD)               LV e' lateral:   6.64 cm/s LV vol d, MOD    57.9 ml       LV E/e' lateral: 18.4 A2C: LV vol d, MOD    70.0 ml A4C: LV vol s, MOD    25.6 ml A2C: LV vol s, MOD    24.3 ml A4C: LV  SV MOD A2C:   32.3 ml LV SV MOD A4C:   70.0 ml LV SV MOD BP:    41.0 ml RIGHT VENTRICLE             IVC RV S prime:     13.20 cm/s  IVC diam: 2.00 cm TAPSE (M-mode): 2.3 cm LEFT ATRIUM             Index        RIGHT ATRIUM           Index LA Vol (A2C):   68.9 ml 32.47 ml/m  RA Area:     12.70 cm LA Vol (A4C):   74.0 ml 34.87 ml/m  RA Volume:   28.00 ml  13.20 ml/m LA Biplane Vol: 71.9 ml 33.88 ml/m  AORTIC VALVE LVOT Vmax:   76.50 cm/s LVOT Vmean:  55.100 cm/s LVOT VTI:    0.177 m MITRAL VALVE MV Area (PHT): 3.40 cm     SHUNTS MV Decel Time: 223 msec     Systemic VTI:  0.18 m MV E velocity: 122.00 cm/s  Systemic Diam: 1.70 cm MV A velocity: 71.00 cm/s MV E/A ratio:  1.72 Georganna Archer Electronically signed by Georganna Archer Signature Date/Time: 01/20/2024/11:16:22 AM    Final    CT ANGIO HEAD NECK W WO CM Result Date: 01/19/2024 EXAM: CT HEAD WITHOUT CTA HEAD AND NECK WITH AND WITHOUT 01/19/2024 09:49:26 AM TECHNIQUE: CTA of the head and neck was performed with and without the administration of 75 mL of intravenous iohexol  (OMNIPAQUE ) 350 MG/ML injection. Noncontrast CT of the head with reconstructed 2-D images are also provided for review. Multiplanar 2D and/or 3D reformatted images are provided for review. Automated exposure control, iterative reconstruction, and/or weight based adjustment of the mA/kV was utilized to reduce the radiation dose to as low as reasonably achievable. COMPARISON: Head CT 01/16/2024 and MRI 01/18/2024 CLINICAL HISTORY: Stroke, follow up. FINDINGS: CT HEAD: BRAIN AND VENTRICLES: As shown on MRI, there are patchy acute right MCA infarcts which are most conspicuous in the frontal operculum and insula  on CT. No malignant hemorrhagic transformation, hydrocephalus, mass, midline shift, or extra-axial fluid collection is identified. Mild cerebral atrophy is within normal limits for age. ORBITS: No acute abnormality. SINUSES AND MASTOIDS: No acute abnormality. CTA NECK: AORTIC ARCH AND ARCH VESSELS: Mild atherosclerotic calcification in the aortic arch. No dissection or arterial injury. No significant stenosis of the brachiocephalic or subclavian arteries. CERVICAL CAROTID ARTERIES: Partially retropharyngeal course of the proximal left ICA. No dissection, arterial injury, or hemodynamically significant stenosis by NASCET criteria. CERVICAL VERTEBRAL ARTERIES: Codominant vertebral arteries. No dissection, arterial injury, or significant stenosis. LUNGS AND MEDIASTINUM: Unremarkable. SOFT TISSUES: Partially visualized breast implants. Bilateral parotid gland fatty atrophy. BONES: Widespread, advanced cervical facet arthrosis and moderately advanced lower cervical disc degeneration. CTA HEAD: ANTERIOR CIRCULATION: The intracranial internal carotid arteries are patent with mild atherosclerosis not resulting in a significant stenosis. ACAs and MCAs are patent without evidence of a significant A1 or left M1 stenosis. There is a severe stenosis in the region of the right MCA bifurcation involving the origins of the M2 superior and inferior divisions. No aneurysm. POSTERIOR CIRCULATION: The intracranial vertebral arteries are widely patent to the basilar. Patent AICA and SCA origins are visualized bilaterally. The basilar artery is widely patent. There are patent posterior communicating arteries bilaterally. Both PCAs are patent without evidence of a significant proximal stenosis. No aneurysm. OTHER: No dural venous sinus thrombosis on this non-dedicated study.  IMPRESSION: 1. Severe stenosis at the right MCA bifurcation. 2. Mild atherosclerosis elsewhere in the head and neck without a significant stenosis. 3. Known patchy  acute right MCA infarcts. Electronically signed by: Dasie Hamburg MD 01/19/2024 11:42 AM EST RP Workstation: [id number]   CT CHEST W CONTRAST Result Date: 01/19/2024 EXAM: CT CHEST WITH CONTRAST 01/18/2024 10:22:22 AM TECHNIQUE: CT of the chest was performed with the administration of 75 mL of iohexol  (OMNIPAQUE ) 300 MG/ML solution. Multiplanar reformatted images are provided for review. Automated exposure control, iterative reconstruction, and/or weight based adjustment of the mA/kV was utilized to reduce the radiation dose to as low as reasonably achievable. COMPARISON: 10/19/2023 CLINICAL HISTORY: FINDINGS: MEDIASTINUM: Heart size is normal. Aortic atherosclerosis and coronary artery calcifications. No pericardial effusion identified. Small hiatal hernia. Central airways are patent. LYMPH NODES: No mediastinal, hilar or axillary lymphadenopathy. LUNGS AND PLEURA: No pleural fluid, interstitial edema, or airspace consolidation. Subpleural nodule in the posterior right upper lobe measures 5 mm, image 33/6. There is a 5 mm nodule within the right lung base, image 108/6. No highly suspicious lung nodules identified to indicate a primary lung neoplasm. No pneumothorax. SOFT TISSUES/BONES: Mass involving the L1 vertebra with 25% anterior height loss is again identified. This measures 3.9 x 3.2 x 3.1 cm, axial image 146/2, and coronal image 74/8. Please refer to the lumbar spine MRI for further details regarding this lesion. Bilateral breast implants. UPPER ABDOMEN: No acute abnormality identified within the imaged portions of the upper abdomen. IMPRESSION: 1. Mass involving the L1 vertebra with 25% anterior height loss, measuring 3.9 x 3.2 x 3.1 cm, with further evaluation described on the lumbar spine MRI report. 2. Two small nodules are identified within the right upper lobe and right lung base, measuring 5 mm. These are nonspecific. 3. Aortic atherosclerosis and coronary artery calcifications. Electronically  signed by: Waddell Calk MD 01/19/2024 05:46 AM EST RP Workstation: [id number]   MR Brain W and Wo Contrast Result Date: 01/18/2024 EXAM: MRI BRAIN WITH AND WITHOUT CONTRAST 01/18/2024 06:37:31 PM TECHNIQUE: Multiplanar multisequence MRI of the head/brain was performed with and without the administration of intravenous contrast. COMPARISON: 07/31/2021 brain MRI; 01/16/2024 head CT CLINICAL HISTORY: Neuro deficit, persistent/recurrent, CNS neoplasm suspected. FINDINGS: BRAIN AND VENTRICLES: Multifocal acute ischemia within the right MCA territory, greatest in the frontal operculum and insula, with a small amount of associated petechial hemorrhage. No acute intracranial hemorrhage. No mass effect or midline shift. No hydrocephalus. The sella is unremarkable. Normal flow voids. No mass or abnormal enhancement within the brain parenchyma. ORBITS: No acute abnormality. SINUSES: No acute abnormality. BONES AND SOFT TISSUES: A new 9 mm lesion of the left anterior calvarium enhances following contrast administration. Normal bone marrow signal and enhancement. No acute soft tissue abnormality. IMPRESSION: 1. Multifocal acute ischemia within the right MCA territory, greatest in the frontal operculum and insula, with a small amount of associated petechial hemorrhage. 2. New 9 mm enhancing lesion of the left anterior calvarium. This is well-circumscribed with a narrow zone of transition on CT, suggesting benignity. 3. No abnormal contrast enhancement within the brain parenchyma. Electronically signed by: Franky Stanford MD 01/18/2024 07:56 PM EST RP Workstation: [id number]   MR Lumbar Spine W Wo Contrast Result Date: 01/18/2024 EXAM: MRI LUMBAR SPINE 01/18/2024 06:37:31 PM TECHNIQUE: Multiplanar multisequence MRI of the lumbar spine was performed with and without the administration of intravenous contrast. COMPARISON: Lumbar spine CT dated 10/18/2023. CT abdomen and pelvis dated 01/17/2024. CLINICAL HISTORY: lumbar mass on  CT ?  of malignancy lumbar mass on CT ? of malignancy FINDINGS: BONES AND ALIGNMENT: Normal alignment. There is a soft tissue lesion of the L1 vertebral body extending anteriorly and superiorly along a previously demonstrated anterior osteophyte. There is low signal intensity within the lesion at T1 and T2 weighted imaging. There is hyperintensity on the postcontrast T1-weighted sequence and on the fluid-sensitive sequence. Additionally, there is approximately 25% anterior height loss of L1. No other bone marrow lesions within the lumbar spine. Superior endplate Schmorl node at L3. SPINAL CORD: The conus terminates normally. SOFT TISSUES: The soft tissue lesion of the L1 vertebral body, described above, extends anteriorly and superiorly along a previously demonstrated anterior osteophyte. L1-L2: Minimal disc bulge. No stenosis. L2-L3: Small disc bulge without stenosis. L3-L4: Small disc bulge with mild spinal canal stenosis. No neural foraminal stenosis. L4-L5: Small right asymmetric disc bulge with mild narrowing of the right lateral recess. No central spinal canal or neural foraminal stenosis. L5-S1: Small left subarticular disc protrusion narrowing the left lateral recess. Mild left foraminal stenosis. No central canal stenosis. IMPRESSION: 1. Soft tissue lesion of the L1 vertebral body, concerning for malignancy, with 25% anterior height loss. Electronically signed by: Franky Stanford MD 01/18/2024 07:49 PM EST RP Workstation: [id number]   IR BONE BIOPSY W IMG GUIDE Result Date: 01/18/2024 INDICATION: 84 year old female with history of L1 vertebral body mass of indeterminate etiology. EXAM: Fluoroscopic guided L1 vertebral body mass biopsy MEDICATIONS: None. ANESTHESIA/SEDATION: Moderate (conscious) sedation was employed during this procedure. A total of Versed  1 mg and Fentanyl  50 mcg was administered intravenously. Moderate Sedation Time: 20 minutes. The patient's level of consciousness and vital signs were  monitored continuously by radiology nursing throughout the procedure under my direct supervision. FLUOROSCOPY TIME:  One hundred three mGy reference air kerma COMPLICATIONS: None immediate. PROCEDURE: Informed written consent was obtained from the patient after a thorough discussion of the procedural risks, benefits and alternatives. All questions were addressed. Maximal Sterile Barrier Technique was utilized including caps, mask, sterile gowns, sterile gloves, sterile drape, hand hygiene and skin antiseptic. A timeout was performed prior to the initiation of the procedure. Procedure was planned after isolating the L1 vertebral body fluoroscopically. Subdermal Local anesthesia was provided at the planned needle entry site. A small skin nick was made. Under fluoroscopic visualization, a 13 gauge trocar was advanced and right transpedicular fashion into the posterior aspect of the L1 vertebral body at the site of the known mass on recent comparison CT. An 11 gauge bone biopsy device was then used to take a single core sample. The sample was placed in formalin. The introducer needle was removed. Hemostasis was achieved with brief manual compression. The patient tolerated the procedure well without immediate complication and was transferred back to the floor in good condition. IMPRESSION: Technically successful fluoroscopic guided L1 vertebral body mass biopsy. Ester Sides, MD [facility] Rad[facility] Electronically Signed   By: Ester Sides M.D.   On: 01/18/2024 15:27   CT ABDOMEN PELVIS W CONTRAST Result Date: 01/17/2024 EXAM: CT ABDOMEN AND PELVIS WITH CONTRAST 01/17/2024 05:40:53 AM TECHNIQUE: CT of the abdomen and pelvis was performed with the administration of 100 mL of iohexol  (OMNIPAQUE ) 300 MG/ML solution. Multiplanar reformatted images are provided for review. Automated exposure control, iterative reconstruction, and/or weight-based adjustment of the mA/kV  was utilized to reduce the radiation dose to as low as reasonably achievable. COMPARISON: 10/18/2023 CLINICAL HISTORY: urinary tract infection with generalized weakness and intermediate confusion. FINDINGS: LOWER CHEST:  No acute abnormality. LIVER: The liver is unremarkable. GALLBLADDER AND BILE DUCTS: Status post cholecystectomy. Common bile duct measures up to 9 mm. No significant intrahepatic bile duct dilatation. SPLEEN: No acute abnormality. PANCREAS: The pancreas is normal in size and contour without focal lesion or ductal dilatation. ADRENAL GLANDS: No acute abnormality. KIDNEYS, URETERS AND BLADDER: No stones in the kidneys or ureters. No hydronephrosis. No perinephric or periureteral stranding. The urinary bladder is partially decompressed. Bladder wall thickening may reflect incomplete distention but can also be seen with cystitis. GI AND BOWEL: Small hiatal hernia. The appendix is visualized and appears normal. Colonic diverticulosis. No signs of acute diverticulitis. There is no bowel obstruction. PERITONEUM AND RETROPERITONEUM: No ascites. No free air. Small fat-containing umbilical hernia. VASCULATURE: Aorta is normal in caliber. Aortic atherosclerosis. LYMPH NODES: No lymphadenopathy. REPRODUCTIVE ORGANS: The uterus appears normal. No adnexal mass. BONES AND SOFT TISSUES: Status post bilateral breast augmentation. There is a progressive, destructive mass involving the L1 vertebra with mild pathologic fracture measuring 3.3 x 3.2 cm, image 26/2. On the previous examination this measured 1.7 x 1.4 cm. Concerning for underlying malignancy. Favor osseous metastasis. No focal soft tissue abnormality. IMPRESSION: 1. No acute findings within the abdomen and pelvis. 2. Bladder wall thickening, possibly due to incomplete distention or cystitis. 3. Progressive, destructive mass involving the L1 vertebra with mild pathologic fracture, now measuring 3.3 x 3.2 cm, concerning for underlying malignancy and favoring  osseous metastasis. Primary the site of disease is unknown. Electronically signed by: Waddell Calk MD 01/17/2024 06:02 AM EST RP Workstation: [id number]   CT Head Wo Contrast Result Date: 01/17/2024 EXAM: CT HEAD WITHOUT CONTRAST 01/16/2024 11:56:27 PM TECHNIQUE: CT of the head was performed without the administration of intravenous contrast. Automated exposure control, iterative reconstruction, and/or weight based adjustment of the mA/kV was utilized to reduce the radiation dose to as low as reasonably achievable. COMPARISON: CT head 07/29/2021. CLINICAL HISTORY: Delirium FINDINGS: BRAIN AND VENTRICLES: No acute hemorrhage. No evidence of acute infarct. No hydrocephalus. No extra-axial collection. No mass effect or midline shift. ORBITS: No acute abnormality. SINUSES: No acute abnormality. SOFT TISSUES AND SKULL: No acute soft tissue abnormality. No skull fracture. IMPRESSION: 1. No acute intracranial abnormality. Electronically signed by: Gilmore Molt 01/17/2024 12:03 AM EST RP Workstation: [id number]   DG Chest Port 1 View Result Date: 01/16/2024 EXAM: 1 VIEW(S) XRAY OF THE CHEST 01/16/2024 11:02:00 PM COMPARISON: CT chest abdomen and pelvis 10/18/2023. CLINICAL HISTORY: Questionable sepsis - evaluate for abnormality. FINDINGS: LUNGS AND PLEURA: No focal pulmonary opacity. No pleural effusion. No pneumothorax. HEART AND MEDIASTINUM: The heart is mildly enlarged. BONES AND SOFT TISSUES: No acute osseous abnormality. IMPRESSION: 1. No acute cardiopulmonary abnormality. 2. Mild cardiomegaly. Electronically signed by: Greig Pique MD 01/16/2024 11:19 PM EST RP Workstation: [id number]    Microbiology: Results for orders placed or performed during the hospital encounter of 01/16/24  Blood Culture (routine x 2)     Status: None (Preliminary result)   Collection Time: 01/16/24 12:30 AM   Specimen: Right Antecubital; Blood  Result Value Ref Range Status   Specimen Description   Final    RIGHT  ANTECUBITAL BOTTLES DRAWN AEROBIC AND ANAEROBIC Performed at [facility], [address] [place], [place] [address]    Special Requests   Final    Blood Culture adequate volume Performed at [facility], [address] [place], [place] [address]    Culture   Final    NO GROWTH 4 DAYS  Performed at [facility], [address] [place], [place] [address]    Report Status PENDING  Incomplete  Blood Culture (routine x 2)     Status: None (Preliminary result)   Collection Time: 01/17/24  5:58 AM   Specimen: BLOOD  Result Value Ref Range Status   Specimen Description   Final    BLOOD LEFT ANTECUBITAL Performed at [facility], [address], [place], [place] [address]    Special Requests   Final    BOTTLES DRAWN AEROBIC AND ANAEROBIC Blood Culture adequate volume Performed at [facility], [address] [place], [place] [address]    Culture   Final    NO GROWTH 4 DAYS Performed at [facility], [address], [place], [place] [address]    Report Status PENDING  Incomplete  Urine Culture     Status: Abnormal   Collection Time: 01/17/24  9:22 AM   Specimen: Urine, Clean Catch  Result Value Ref Range Status   Specimen Description   Final    URINE, CLEAN CATCH Performed at [facility], [address], [place], [place] [address]    Special Requests   Final    NONE Performed at [facility], [address] [place], [place] [address]    Culture MULTIPLE SPECIES PRESENT, SUGGEST RECOLLECTION (A)  Final   Report Status 01/18/2024 FINAL  Final  Urine Culture (for pregnant, neutropenic or urologic patients or patients with an indwelling urinary catheter)     Status: None   Collection Time: 01/18/24  2:03 PM   Specimen: Urine, Clean Catch  Result Value Ref Range Status   Specimen Description   Final    URINE, CLEAN CATCH Performed at [facility], [address], [place], [place] [address]    Special Requests   Final    NONE Performed at [facility], [address] [place], [place] [address]    Culture   Final    NO GROWTH Performed at [facility], [address], [place], [place] [address]    Report Status 01/19/2024 FINAL  Final    Labs: CBC: Recent Labs  Lab 01/17/24 0047 01/18/24 0435 01/19/24 0500 01/21/24 0605  WBC 8.2 6.0 5.8 5.3  NEUTROABS 5.3  --  2.8  --   HGB 10.5* 11.1* 10.9* 10.6*  HCT 33.1* 34.8* 33.9* 32.4*  MCV 102.5* 100.9* 101.5* 100.0  PLT 213 223 213 224   Basic Metabolic Panel: Recent Labs  Lab 01/17/24 0047 01/18/24 0435 01/19/24 0500 01/21/24 0605  NA 135 135 136 135  K 4.4 4.3 3.8 4.1  CL 100 100 102 102  CO2 24 22 24 24   GLUCOSE 301* 180* 156* 183*  BUN 20 15 14 11   CREATININE 1.03* 0.81 0.72 0.70  CALCIUM  10.0 10.3 9.9 9.8  MG  --   --  1.9  --   PHOS  --   --  4.0  --    Liver Function Tests: Recent Labs  Lab 01/17/24 0047 01/18/24 0435 01/19/24 0500  AST 23 24 23   ALT 11 10 9   ALKPHOS 68 64 65  BILITOT 0.6 0.6 0.6  PROT 8.3* 8.4* 8.5*  ALBUMIN 3.9 3.8 3.9   CBG: Recent Labs  Lab 01/20/24 1612 01/20/24 2056 01/21/24 0757 01/21/24 1151 01/21/24 1725  GLUCAP 226* 224* 199* 210* 248*    Discharge time spent: greater than 30 minutes.  Signed: Delon Herald, MD [facility] 01/21/2024 "

## 2024-01-21 NOTE — Evaluation (Signed)
 Speech Language Pathology Evaluation Patient Details Name: Jennifer Boyer MRN: [id number] DOB: September 13, 1940 Today's Date: 01/21/2024 Time: 8990-8964 SLP Time Calculation (min) (ACUTE ONLY): 26 min  Problem List:  Patient Active Problem List   Diagnosis Date Noted   DNR (do not resuscitate) 01/20/2024   Dementia without behavioral disturbance (HCC) 01/19/2024   GERD (gastroesophageal reflux disease) 01/19/2024   Acute cerebral ischemia 01/19/2024   Autoimmune thyroiditis 01/17/2024   Morbid obesity (HCC) 01/17/2024   Neuropathy 01/17/2024   Type 2 diabetes mellitus with other specified complication (HCC) 01/17/2024   Hyperglycemia due to type 2 diabetes mellitus (HCC) 01/17/2024   Long term current use of insulin  (HCC) 01/17/2024   Class 2 obesity 01/17/2024   Acute metabolic encephalopathy 01/17/2024   Acute UTI (urinary tract infection) 01/17/2024   Pathological fracture of lumbar vertebra 01/17/2024   Lumbar mass (HCC) 01/17/2024   Intractable low back pain 10/19/2023   DKA, type 2 (HCC) 07/29/2021   Normocytic anemia 07/29/2021   Hyperkalemia 07/29/2021   AKI (acute kidney injury) 07/29/2021   HTN (hypertension) 07/29/2021   Hypothyroidism 07/29/2021   HLD (hyperlipidemia) 07/29/2021   Prolapse of female genital organs 02/17/2018   Osteoarthritis 02/17/2018   HNP (herniated nucleus pulposus), lumbar 03/24/2017   Past Medical History:  Past Medical History:  Diagnosis Date   Acute metabolic encephalopathy 01/17/2024   Arthritis    OA   Autoimmune thyroiditis 01/17/2024   Complication of anesthesia    pt has been told she is hard to intubate.   Dementia (HCC)    Diabetes mellitus without complication (HCC)    Difficult intubation    Fibromyalgia    GERD (gastroesophageal reflux disease)    Headache    HX MIGRAINES  NONE IN LONG TIME   History of hiatal hernia    HLD (hyperlipidemia) 07/29/2021   Hypertension    Hypothyroidism    Past Surgical History:  Past  Surgical History:  Procedure Laterality Date   BREAST SURGERY     bilateral mastectomy   CHOLECYSTECTOMY     DILATATION & CURETTAGE/HYSTEROSCOPY WITH TRUECLEAR N/A 09/30/2012   Procedure: DILATATION & CURETTAGE/HYSTEROSCOPY WITH TRUECLEAR;  Surgeon: Lynwood FORBES Clubs II, MD;  Location: [facility];  Service: Gynecology;  Laterality: N/A;   DILATION AND CURETTAGE OF UTERUS     uterine polyp   IR IVC FILTER RETRIEVAL / S&I /IMG GUID/MOD SED  01/18/2024   KNEE SURGERY     LUMBAR LAMINECTOMY/DECOMPRESSION MICRODISCECTOMY Left 03/24/2017   Procedure: LEFT LUMBAR FIVE-SACRAL ONE MICRODISCECTOMY, LEFT LUMBAR FOUR-FIVE DECOMPRESSIVE LAMINECTOMY;  Surgeon: Onetha Kuba, MD;  Location: [facility] OR;  Service: Neurosurgery;  Laterality: Left;  Left L4-5 L5-S1 Laminectomy/Foraminotomy   LUMBAR LAMINECTOMY/DECOMPRESSION MICRODISCECTOMY Left 09/01/2017   Procedure: Microdiscectomy - Lumbar Five-Sacral One - left redo;  Surgeon: Onetha Kuba, MD;  Location: [facility] OR;  Service: Neurosurgery;  Laterality: Left;  left   TONSILLECTOMY     HPI:  Jennifer Boyer is an 84 yo presenting from ILF on 01/16/24 with weakness and intermittent confusion. She was admitted and tx was started for UTI. MRI Brain showed multifocal acute ischemia within the R MCA territory.  MRI Lumbar Spine revealed lumbar pathological fracture and vertebral soft tissue mass concern for malignancy. Biopsy was taken on 12/30. PMH includes: dementia, fibromyalgia, DM II, HTN, GERD,  lumbar herniation, DJD, and R sacral stimulator.   Assessment / Plan / Recommendation Clinical Impression  Pt has a h/o dementia but reports being fairly independent at ILF (denies needing assistance  with iADLs). Today she exhibits decreased attention, recall, problem solving, and awareness. If this represents an acute neurological change, then she would benefit from SLP f/u at next level of care as well as increased support with iADLs upon return.   Pt reports feeling like she is back to  her cognitive baseline; however, she also described being able to get up and go to the bathroom on her own. She stated that she is supposed to be discharged soon (initially saying tomorrow, then saying today), so she can do it independently. Pt with decreased awareness of physical abilities and need for assist. Pt completed only part of the SLUMS before needing to urgently stop testing to use the bathroom, scoring 9 out of a possible 21 points. This would suggest more cognitive deficits than what pt describes to be her baseline. Question if this is an acute change, or given her reduced awareness, if this represents inaccuracy as a historian. Follow up upon return to her facility could help differentiate baseline level of function as she her baseline would be better known there, but would also encourage increased assist upon discharge.      SLP Assessment  SLP Recommendation/Assessment: Patient needs continued Speech Language Pathology Services SLP Visit Diagnosis: Cognitive communication deficit (R41.841)     Assistance Recommended at Discharge  Frequent or constant Supervision/Assistance  Functional Status Assessment Patient has had a recent decline in their functional status and demonstrates the ability to make significant improvements in function in a reasonable and predictable amount of time.  Frequency and Duration min 2x/week  2 weeks      SLP Evaluation Cognition  Overall Cognitive Status: No family/caregiver present to determine baseline cognitive functioning Arousal/Alertness: Awake/alert Orientation Level: Oriented to person;Oriented to place;Oriented to situation;Oriented to time Attention: Sustained Sustained Attention: Impaired Sustained Attention Impairment: Verbal complex Memory: Impaired Memory Impairment: Decreased recall of new information Awareness: Impaired Awareness Impairment: Intellectual impairment;Anticipatory impairment Problem Solving: Impaired Problem Solving  Impairment: Functional complex Safety/Judgment: Impaired       Comprehension  Auditory Comprehension Overall Auditory Comprehension: Impaired Commands: Impaired Complex Commands:  (difficulty with more complex instructions, needed repetitions)    Expression Expression Primary Mode of Expression: Verbal Verbal Expression Overall Verbal Expression: Appears within functional limits for tasks assessed   Oral / Motor  Motor Speech Overall Motor Speech: Appears within functional limits for tasks assessed            Leita SAILOR., M.A. CCC-SLP Acute Rehabilitation Services Office: [phone]  Secure chat preferred  01/21/2024, 11:04 AM

## 2024-01-21 NOTE — TOC Transition Note (Signed)
 Transition of Care [facility]) - Discharge Note   Patient Details  Name: Jennifer Boyer MRN: [id number] Date of Birth: 1940/09/27  Transition of Care [facility]) CM/SW Contact:  Sonda Manuella Quill, RN Phone Number: 01/21/2024, 6:19 PM   Clinical Narrative:    Orders received for HHPT/OT/Speech, and ambulatory Palliative Care;pt from [facility]; orders faxed to [facility] [phone]) contract agency for [facility] services, and Abby at [place] [phone] fax); Abby said facility does not have contracted palliative care agency; pt's son Lamar Fleeta Hallmark ([phone]) notified; he said pt's dtr will provide transportation; he was notified orders faxed, and list of palliative care agencies placed in d/c packet; family will notify agency of choice; electronic confirmation for [facility] received at 1808, and [facility] at 1818; no IP CM needs.   Final next level of care: Home w Home Health Services Barriers to Discharge: No Barriers Identified   Patient Goals and CMS Choice Patient states their goals for this hospitalization and ongoing recovery are:: [place] [facility].gov Compare Post Acute Care list provided to:: Patient Choice offered to / list presented to : Patient [place] ownership interest in [facility].provided to:: Patient    Discharge Placement                  Name of family member notified: Lamar Fleeta Hallmark (son) [phone] Patient and family notified of of transfer: 01/21/24  Discharge Plan and Services Additional resources added to the After Visit Summary for   In-house Referral: NA Discharge Planning Services: CM Consult            DME Arranged: N/A DME Agency: NA       HH Arranged: PT, OT, Speech Therapy HH Agency: Other - See comment Date HH Agency Contacted: 01/21/24 Time HH Agency Contacted: [phone] Representative spoke with at [facility] Agency: Notified Abby at [place] orders faxed to [facility] (contracted [facility] agency [phone])  Social Drivers of Health  (SDOH) Interventions SDOH Screenings   Food Insecurity: No Food Insecurity (01/17/2024)  Housing: Low Risk (01/17/2024)  Transportation Needs: No Transportation Needs (01/17/2024)  Utilities: Not At Risk (01/17/2024)  Social Connections: Moderately Isolated (01/17/2024)  Tobacco Use: Low Risk (10/19/2023)     Readmission Risk Interventions    01/18/2024   10:53 AM 10/21/2023   12:18 PM 10/19/2023    1:53 PM  Readmission Risk Prevention Plan  Transportation Screening Complete Complete Complete  PCP or Specialist Appt within 5-7 Days Complete Complete Complete  Home Care Screening Complete Complete Complete  Medication Review (RN CM) Complete Complete Complete

## 2024-01-21 NOTE — Progress Notes (Signed)
 Physical Therapy Treatment Patient Details Name: Jennifer Boyer MRN: [id number] DOB: 10-Jul-1940 Today's Date: 01/21/2024   History of Present Illness 84 yo female presents to therapy following hospital admission on 01/16/2024 due to weakness and intermittent confusion, with dx of acute metabolic encephalopathy in setting of suspected UTI superimposed on dementia. Pt also found to have L1 mass and pathological fx. Pt underwent L1 bone mass biopsy on 12/30. Pt was admitted to hospital on 10/18/2023 due to back pain > 1 wk. Pt was dx with UTI 9/25 and started ABX however pain continued, pt returned to ED. Pt PMH includes but is not limited to: fibromyalgia, DM II, HTN, GERD,  dementia, lumbar herniation, DJD, and R sacral stimulator.    PT Comments  Pt resting in bed with daughter at bedside. Pt agreeable to therapy, reports mild low back pain. Pt able to transfer to EOB at close SPV with increased time, effort, use of bed rail and cues for sequencing. Increased gait distance tolerated today, continues to have slow gait speed. Pt motivated to improve her strength and return home, agreeable to remain up in recliner at end of session. PT will continue to follow while she is in acute. No change to discharge recommendation.   If plan is discharge home, recommend the following: A little help with walking and/or transfers;A little help with bathing/dressing/bathroom;Assistance with cooking/housework;Assist for transportation;Supervision due to cognitive status   Can travel by private vehicle        Equipment Recommendations       Recommendations for Other Services       Precautions / Restrictions Precautions Precautions: Fall Recall of Precautions/Restrictions: Impaired Precaution/Restrictions Comments: history of cognitive deficits, difficulty with memory Restrictions Weight Bearing Restrictions Per Provider Order: No     Mobility  Bed Mobility Overal bed mobility: Needs Assistance Bed  Mobility: Supine to Sit     Supine to sit: HOB elevated, Used rails, Supervision     General bed mobility comments: pt requested no help to sit up, increased time and effort with VC for sequencing, however pt able to sit up on her own with heavy use of bedrail    Transfers Overall transfer level: Needs assistance Equipment used: Rolling walker (2 wheels) Transfers: Sit to/from Stand, Bed to chair/wheelchair/BSC Sit to Stand: Contact guard assist   Step pivot transfers: Contact guard assist       General transfer comment: increased time to complete transfers, VC for hand placement and muscle recruitment to assist with hip and trunk extension    Ambulation/Gait Ambulation/Gait assistance: Contact guard assist Gait Distance (Feet): 100 Feet Assistive device: Rolling walker (2 wheels) Gait Pattern/deviations: Step-through pattern Gait velocity: decreased     General Gait Details: continues to have slightly forward flexed posture however remains stable on feet with increased distance. Gait speed very slow consistent with increased time needed for all mobility. VC for navigation in hallway   Stairs             Wheelchair Mobility     Tilt Bed    Modified Rankin (Stroke Patients Only)       Balance Overall balance assessment: Mild deficits observed, not formally tested                                          Communication Communication Communication: No apparent difficulties Factors Affecting Communication: Hearing impaired (hearing aids)  Cognition Arousal: Alert Behavior During Therapy: WFL for tasks assessed/performed   PT - Cognitive impairments: History of cognitive impairments                       PT - Cognition Comments: required intermittent assistance from daughter to clarify information during session Following commands: Impaired Following commands impaired: Follows multi-step commands inconsistently    Cueing  Cueing Techniques: Verbal cues, Gestural cues, Tactile cues  Exercises      General Comments        Pertinent Vitals/Pain Pain Assessment Pain Assessment: Faces Faces Pain Scale: Hurts little more Pain Location: back Pain Descriptors / Indicators: Discomfort, Dull Pain Intervention(s): Repositioned, Monitored during session, Limited activity within patient's tolerance    Home Living                          Prior Function            PT Goals (current goals can now be found in the care plan section) Acute Rehab PT Goals Patient Stated Goal: go back home PT Goal Formulation: With patient Time For Goal Achievement: 02/02/24 Potential to Achieve Goals: Good Progress towards PT goals: Progressing toward goals    Frequency    Min 3X/week      PT Plan      Co-evaluation              AM-PAC PT 6 Clicks Mobility   Outcome Measure  Help needed turning from your back to your side while in a flat bed without using bedrails?: A Little Help needed moving from lying on your back to sitting on the side of a flat bed without using bedrails?: A Little Help needed moving to and from a bed to a chair (including a wheelchair)?: A Little Help needed standing up from a chair using your arms (e.g., wheelchair or bedside chair)?: A Little Help needed to walk in hospital room?: A Little Help needed climbing 3-5 steps with a railing? : A Lot 6 Click Score: 17    End of Session Equipment Utilized During Treatment: Gait belt Activity Tolerance: Patient tolerated treatment well;No increased pain Patient left: in chair;with call bell/phone within reach;with chair alarm set;with family/visitor present Nurse Communication: Mobility status PT Visit Diagnosis: Unsteadiness on feet (R26.81);Other abnormalities of gait and mobility (R26.89);Muscle weakness (generalized) (M62.81);Difficulty in walking, not elsewhere classified (R26.2);Pain Pain - part of body:  (back)      Time: 8571-8547 PT Time Calculation (min) (ACUTE ONLY): 24 min  Charges:    $Gait Training: 8-22 mins $Therapeutic Activity: 8-22 mins                       Isaiah DEL. Jennifer Boyer, PT, DPT    [employer] 01/21/2024, 3:28 PM

## 2024-01-21 NOTE — Assessment & Plan Note (Addendum)
 Body mass index is 37.11 kg/m.SABRA  Weight loss should be encouraged Outpatient PCP/bariatric medicine f/u encouraged Significantly low or high BMI is associated with higher medical risk including morbidity and mortality

## 2024-01-21 NOTE — Assessment & Plan Note (Addendum)
 Patient presented with generalized weakness and intermittent confusion She has h/o recurrent UTI and chronic dementia Admitted to MedSurg/inpatient UA showed hazy appearance with greater than 500 glucose, trace leukocytes, negative nitrites, many bacteria, 6-10 RBCs per high-power field and 0-5 WBCs CT showed bladder wall thickening likely due to incomplete distention or cystitis Initial urine culture with multiple species so recollected and repeat culture performed and negative Blood cultures NTD Given IV Ceftriaxone   No further antibiotics appear to be indicated at this time

## 2024-01-21 NOTE — Progress Notes (Signed)
 "                                                                                                                                                                                                          Daily Progress Note   Patient Name: Jennifer Boyer       Date: 01/21/2024 DOB: 1940/04/07  Age: 84 y.o. MRN#: [id number] Attending Physician: Barbarann Nest, MD Primary Care Physician: Dayna Motto, DO Admit Date: 01/16/2024  Reason for Consultation/Follow-up: Establishing goals of care  Patient Profile/HPI: 84 y.o. female  with past medical history of macrocytic anemia, osteoarthritis, dementia, DM2, fibromyalgia, living at [facility] admitted on 01/16/2024 with weakness and intermittent confusion. She was admitted and treatment was started for UTI. She had MRI brain which revealed R MCA territory ischemia. She also had MRI lumbar spine that revealed lumbar pathological fracture and vertebral soft tissue mass concern for malignancy. Biopsy was taken on 12/30.  Palliative medicine consulted for GOC.   Subjective: Chart reviewed including labs, progress notes, imaging from this and previous encounters. Pathology has not yet resulted.  On evaluation- [name] was sitting up in bed, eating, requested more coffee.  She denies any pain or other symptoms. She is eager to be discharged.  I spoke with her daughter Sherlean. She was notified that patient is being discharged while we were on the phone.  Patient lives in independent living with her cat. Her family assists with setting up her medications and other cares.  We discussed role of Palliative medicine and palliative consult. I recommended outpatient [facility] for followup to assist with decision making once biopsy results are returned and Sherlean is in agreement with this.   Review of Systems  All other systems reviewed and are negative.    Physical Exam Vitals and nursing note reviewed.  Constitutional:      General: She is not in  acute distress.    Appearance: She is not ill-appearing.  Cardiovascular:     Rate and Rhythm: Normal rate.  Pulmonary:     Effort: Pulmonary effort is normal.  Neurological:     Mental Status: She is alert.             Vital Signs: BP (!) 141/72 (BP Location: Left Arm)   Pulse 74   Temp 98.4 F (36.9 C)   Resp 15   Ht 5' 6 (1.676 m)   Wt 104.3 kg   SpO2 99%   BMI 37.11 kg/m  SpO2: SpO2: 99 % O2 Device: O2 Device: Room Air O2 Flow Rate:  O2 Flow Rate (L/min): 3 L/min  Intake/output summary:  Intake/Output Summary (Last 24 hours) at 01/21/2024 1603 Last data filed at 01/21/2024 1106 Gross per 24 hour  Intake 240 ml  Output 700 ml  Net -460 ml   LBM: Last BM Date : 01/21/23 Baseline Weight: Weight: 104.3 kg Most recent weight: Weight: 104.3 kg    Patient Active Problem List   Diagnosis Date Noted   DNR (do not resuscitate) 01/20/2024   Dementia without behavioral disturbance (HCC) 01/19/2024   GERD (gastroesophageal reflux disease) 01/19/2024   Acute cerebral ischemia 01/19/2024   Autoimmune thyroiditis 01/17/2024   Morbid obesity (HCC) 01/17/2024   Neuropathy 01/17/2024   Type 2 diabetes mellitus with other specified complication (HCC) 01/17/2024   Hyperglycemia due to type 2 diabetes mellitus (HCC) 01/17/2024   Long term current use of insulin  (HCC) 01/17/2024   Class 2 obesity 01/17/2024   Acute metabolic encephalopathy 01/17/2024   Acute UTI (urinary tract infection) 01/17/2024   Pathological fracture of lumbar vertebra 01/17/2024   Lumbar mass (HCC) 01/17/2024   Intractable low back pain 10/19/2023   DKA, type 2 (HCC) 07/29/2021   Normocytic anemia 07/29/2021   Hyperkalemia 07/29/2021   AKI (acute kidney injury) 07/29/2021   HTN (hypertension) 07/29/2021   Hypothyroidism 07/29/2021   HLD (hyperlipidemia) 07/29/2021   Prolapse of female genital organs 02/17/2018   Osteoarthritis 02/17/2018   HNP (herniated nucleus pulposus), lumbar 03/24/2017     Palliative Care Assessment & Plan    Assessment/Recommendations/Plan  UTI- resolved Possible new malignancy- soft tissue mass of lumbar vertebrae, pathological fracture- daughter notes they are unlikely to pursue treatment, however, await biopsy results TOC order placed for referral to outpatient Palliative   Code Status:   Code Status: Limited: Do not attempt resuscitation (DNR) -DNR-LIMITED -Do Not Intubate/DNI    Prognosis:  Unable to determine  Discharge Planning: To Be Determined  Care plan was discussed with patient's daughter  Thank you for allowing the Palliative Medicine Team to assist in the care of this patient.  Total time: 30 mins Prolonged billing:  Time includes:   Preparing to see the patient (e.g., review of tests) Obtaining and/or reviewing separately obtained history Performing a medically necessary appropriate examination and/or evaluation Counseling and educating the patient/family/caregiver Ordering medications, tests, or procedures Referring and communicating with other health care professionals (when not reported separately) Documenting clinical information in the electronic or other health record Independently interpreting results (not reported separately) and communicating results to the patient/family/caregiver Care coordination (not reported separately) Clinical documentation  Cassondra Stain, AGNP-C Palliative Medicine   Please contact Palliative Medicine Team phone at [phone] for questions and concerns.        "

## 2024-01-21 NOTE — Assessment & Plan Note (Addendum)
"   Continue rosuvastatin          "

## 2024-01-21 NOTE — Assessment & Plan Note (Addendum)
 TSH WNL  Continue Synthroid

## 2024-01-21 NOTE — Plan of Care (Signed)
  Problem: Education: Goal: Knowledge of General Education information will improve Description: Including pain rating scale, medication(s)/side effects and non-pharmacologic comfort measures Outcome: Adequate for Discharge   Problem: Health Behavior/Discharge Planning: Goal: Ability to manage health-related needs will improve Outcome: Adequate for Discharge   Problem: Clinical Measurements: Goal: Ability to maintain clinical measurements within normal limits will improve Outcome: Adequate for Discharge Goal: Will remain free from infection Outcome: Adequate for Discharge Goal: Diagnostic test results will improve Outcome: Adequate for Discharge Goal: Respiratory complications will improve Outcome: Adequate for Discharge Goal: Cardiovascular complication will be avoided Outcome: Adequate for Discharge   Problem: Nutrition: Goal: Adequate nutrition will be maintained Outcome: Adequate for Discharge   Problem: Coping: Goal: Level of anxiety will decrease Outcome: Adequate for Discharge   Problem: Elimination: Goal: Will not experience complications related to bowel motility Outcome: Adequate for Discharge Goal: Will not experience complications related to urinary retention Outcome: Adequate for Discharge   

## 2024-01-22 ENCOUNTER — Other Ambulatory Visit: Payer: Self-pay | Admitting: Physician Assistant

## 2024-01-22 DIAGNOSIS — I639 Cerebral infarction, unspecified: Secondary | ICD-10-CM

## 2024-01-22 LAB — CULTURE, BLOOD (ROUTINE X 2)
Culture: NO GROWTH
Culture: NO GROWTH
Special Requests: ADEQUATE
Special Requests: ADEQUATE

## 2024-01-25 ENCOUNTER — Encounter: Payer: Self-pay | Admitting: Family Medicine

## 2024-01-26 ENCOUNTER — Other Ambulatory Visit

## 2024-01-27 LAB — SURGICAL PATHOLOGY

## 2024-01-29 ENCOUNTER — Inpatient Hospital Stay: Admission: RE | Admit: 2024-01-29 | Source: Ambulatory Visit
